# Patient Record
Sex: Female | Born: 1975 | Race: White | Hispanic: No | Marital: Married | Smoking: Never smoker
Health system: Southern US, Community
[De-identification: ages and names within clinical notes are randomized; demographics above are authoritative.]

## PROBLEM LIST (undated history)

## (undated) DIAGNOSIS — G43909 Migraine, unspecified, not intractable, without status migrainosus: Secondary | ICD-10-CM

## (undated) DIAGNOSIS — F419 Anxiety disorder, unspecified: Secondary | ICD-10-CM

## (undated) DIAGNOSIS — R7989 Other specified abnormal findings of blood chemistry: Secondary | ICD-10-CM

## (undated) DIAGNOSIS — M797 Fibromyalgia: Secondary | ICD-10-CM

## (undated) DIAGNOSIS — Z77120 Contact with and (suspected) exposure to mold (toxic): Secondary | ICD-10-CM

## (undated) DIAGNOSIS — Z923 Personal history of irradiation: Secondary | ICD-10-CM

## (undated) DIAGNOSIS — C50919 Malignant neoplasm of unspecified site of unspecified female breast: Secondary | ICD-10-CM

## (undated) HISTORY — DX: Anxiety disorder, unspecified: F41.9

## (undated) HISTORY — PX: CERVICAL SPINE SURGERY: SHX589

## (undated) HISTORY — DX: Other specified abnormal findings of blood chemistry: R79.89

## (undated) HISTORY — DX: Contact with and (suspected) exposure to mold (toxic): Z77.120

## (undated) HISTORY — DX: Fibromyalgia: M79.7

## (undated) HISTORY — DX: Migraine, unspecified, not intractable, without status migrainosus: G43.909

---
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
## 2016-05-12 DIAGNOSIS — R51 Headache: Secondary | ICD-10-CM | POA: Diagnosis not present

## 2016-05-12 DIAGNOSIS — M62830 Muscle spasm of back: Secondary | ICD-10-CM | POA: Diagnosis not present

## 2016-05-12 DIAGNOSIS — M791 Myalgia: Secondary | ICD-10-CM | POA: Diagnosis not present

## 2016-05-12 DIAGNOSIS — M9902 Segmental and somatic dysfunction of thoracic region: Secondary | ICD-10-CM | POA: Diagnosis not present

## 2016-05-12 DIAGNOSIS — M9901 Segmental and somatic dysfunction of cervical region: Secondary | ICD-10-CM | POA: Diagnosis not present

## 2016-05-12 DIAGNOSIS — M5032 Other cervical disc degeneration, mid-cervical region, unspecified level: Secondary | ICD-10-CM | POA: Diagnosis not present

## 2016-05-13 DIAGNOSIS — M9901 Segmental and somatic dysfunction of cervical region: Secondary | ICD-10-CM | POA: Diagnosis not present

## 2016-05-13 DIAGNOSIS — M9902 Segmental and somatic dysfunction of thoracic region: Secondary | ICD-10-CM | POA: Diagnosis not present

## 2016-05-13 DIAGNOSIS — M791 Myalgia: Secondary | ICD-10-CM | POA: Diagnosis not present

## 2016-05-13 DIAGNOSIS — M62830 Muscle spasm of back: Secondary | ICD-10-CM | POA: Diagnosis not present

## 2016-05-13 DIAGNOSIS — R51 Headache: Secondary | ICD-10-CM | POA: Diagnosis not present

## 2016-05-13 DIAGNOSIS — M5032 Other cervical disc degeneration, mid-cervical region, unspecified level: Secondary | ICD-10-CM | POA: Diagnosis not present

## 2016-05-20 DIAGNOSIS — M9901 Segmental and somatic dysfunction of cervical region: Secondary | ICD-10-CM | POA: Diagnosis not present

## 2016-05-20 DIAGNOSIS — R51 Headache: Secondary | ICD-10-CM | POA: Diagnosis not present

## 2016-05-20 DIAGNOSIS — M62830 Muscle spasm of back: Secondary | ICD-10-CM | POA: Diagnosis not present

## 2016-05-20 DIAGNOSIS — M9902 Segmental and somatic dysfunction of thoracic region: Secondary | ICD-10-CM | POA: Diagnosis not present

## 2016-05-20 DIAGNOSIS — M5032 Other cervical disc degeneration, mid-cervical region, unspecified level: Secondary | ICD-10-CM | POA: Diagnosis not present

## 2016-05-20 DIAGNOSIS — M791 Myalgia: Secondary | ICD-10-CM | POA: Diagnosis not present

## 2016-05-26 ENCOUNTER — Encounter: Payer: Self-pay | Admitting: Neurology

## 2016-05-26 ENCOUNTER — Ambulatory Visit (INDEPENDENT_AMBULATORY_CARE_PROVIDER_SITE_OTHER): Payer: 59 | Admitting: Neurology

## 2016-05-26 DIAGNOSIS — G243 Spasmodic torticollis: Secondary | ICD-10-CM | POA: Diagnosis not present

## 2016-05-26 DIAGNOSIS — G43709 Chronic migraine without aura, not intractable, without status migrainosus: Secondary | ICD-10-CM

## 2016-05-26 NOTE — Patient Instructions (Addendum)
Remember to drink plenty of fluid, eat healthy meals and do not skip any meals. Try to eat protein with a every meal and eat a healthy snack such as fruit or nuts in between meals. Try to keep a regular sleep-wake schedule and try to exercise daily, particularly in the form of walking, 20-30 minutes a day, if you can.   I would like to see you back for botox injections, sooner if we need to. Please call us with any interim questions, concerns, problems, updates or refill requests.   Our phone number is [phone]. We also have an after hours call service for urgent matters and there is a physician on-call for urgent questions. For any emergencies you know to call 911 or go to the nearest emergency room

## 2016-05-26 NOTE — Progress Notes (Signed)
[facility]    Provider:  Dr Jaynee Eagles Referring Provider: No ref. provider found Primary Care Physician:  Aretta Nip, MD  CC:  migraines  HPI:  Holly Jenkins is a 40 y.o. female here as a referral for migraines. PMHx migraines, cervical dystonia, fibromyalgia. Started 20 years ago, Continuous birth control helped a lot. Trigger is alcohol. Neck pain is a trigger. She has confusion, light sensitivity, nausea, no vomiting, bilateral, tightness, pressure. When severe she needs a dark room and to lay still and cool. No aura. Wakes up with them and more at night as well. When she has one, slowly progresses to 6/10 on average. They usually last 24 hours. The cambia and rizatriptan. She has daily headaches. 8 days a month are migrainous. She was due on botox the 24tj of July. She has neck pain and a diagnosis of cervical dystonia. She has tried muscle relaxers, chiropractic, massage, physical therapy. Very tight progressive muscle pain. Additional botox in the shoulders has significantly helped with the pain and limited range of motion. Previous to botox in the cervical muscles she had a 5/10 in pain all the time significantly improved. Tizanidine does not help the cervical dystonia that it does help with the migraines. Patient is also on clonazepam which does not help with cervical dystonia. She has also tried Robaxin  Medications tried: Topiramate, nortriptyline, propranolol, lyrica, "a million preventatives", tizanidine, rizatriptan and other triptans she cannot remember, cambia, Zanaflex, Maxalt, trigger point injections in her cervical muscles, massage, Robaxin.  Reviewed notes, labs and imaging from outside physicians, which showed: Patient is being seen at [facility] in [place]. Patient has been receiving Botox injections. Per notes, Botox decrease his frequency and severity significantly, patient headaches and migraines increase when she  misses her Botox injections. The headaches are mostly frontotemporal and size of the head. Some right base of her skull and she also has some discomfort in her posterior neck and down her shoulders and she has had spine surgery as well. Multifactorial situation. It appears they injected 250 units of Botox. 155 for Botox migraine protocol and additional 100 for cervical dystonia. They injected the areas where she had most of her pain as well as in her shoulders and neck. She sees a pain specialist as well for her neck pain. She was diagnosed with chronic daily migraine. It appears they also used additional Botox in the front half of her head and also in the nuchal ridge and down her neck. She has had multiple side effects from medications. She is on Zanaflex currently. Patient also is having some radiculopathy into her right arm, the Botox helped in the cervical muscles. She has noticed wearing off effect the 2 weeks prior to her Botox injections.  Reviewed MRI of the brain report. MRI of the brain 05/10/2013. Unremarkable MRI of the brain.   Review of Systems: Patient complains of symptoms per HPI as well as the following symptoms: Fatigue, headaches, dizziness, joint pain, anxiety, decreased energy, allergies . Pertinent negatives per HPI. All others negative.   Social History   Social History  . Marital status: Married    Spouse name: N/A  . Number of children: N/A  . Years of education: N/A   Occupational History  . Not on file.   Social History Main Topics  . Smoking status: Never Smoker  . Smokeless tobacco: Never Used  . Alcohol use No  . Drug use: No  . Sexual activity: Not on file  Other Topics Concern  . Not on file   Social History Narrative  . No narrative on file    Family History  Problem Relation Age of Onset  . Healthy Mother   . Diabetes Maternal Grandmother     Past Medical History:  Diagnosis Date  . Anxiety   . Fibromyalgia   . Migraines     Past  Surgical History:  Procedure Laterality Date  . CERVICAL SPINE SURGERY     c5-c6-c7 fusion with disc transplants    Current Outpatient Prescriptions  Medication Sig Dispense Refill  . Cholecalciferol (VITAMIN D3) 2000 units TABS Take 1 tablet by mouth.    . clonazePAM (KLONOPIN) 0.5 MG tablet Take 0.5 mg by mouth 2 (two) times daily as needed for anxiety.    . Diclofenac Potassium (CAMBIA) 50 MG PACK Take by mouth.    . escitalopram (LEXAPRO) 10 MG tablet Take 10 mg by mouth daily.    Marland Kitchen ethynodiol-ethinyl estradiol (KELNOR,ZOVIA) 1-35 MG-MCG tablet Take 1 tablet by mouth daily.    . Misc Natural Products (OSTEO BI-FLEX JOINT SHIELD PO) Take by mouth.    . pregabalin (LYRICA) 150 MG capsule Take 150 mg by mouth daily.    . Probiotic Product (PROBIOTIC PO) Take by mouth.    . rizatriptan (MAXALT-MLT) 10 MG disintegrating tablet Take 10 mg by mouth as needed for migraine. May repeat in 2 hours if needed    . tiZANidine (ZANAFLEX) 4 MG tablet Take 4 mg by mouth at bedtime. Take 1 to 1.5 tablets daily     No current facility-administered medications for this visit.     Allergies as of 05/26/2016 - Review Complete 05/26/2016  Allergen Reaction Noted  . Dust mite extract  05/26/2016    Vitals: BP 120/82   Pulse 80   Resp 20   Ht 5\' 8"  (1.727 m)   Wt 154 lb (69.9 kg)   BMI 23.42 kg/m  Last Weight:  Wt Readings from Last 1 Encounters:  05/26/16 154 lb (69.9 kg)   Last Height:   Ht Readings from Last 1 Encounters:  05/26/16 5\' 8"  (1.727 m)    Physical exam: Exam: Gen: NAD, conversant, well nourised, well groomed                     CV: RRR, no MRG. No Carotid Bruits. No peripheral edema, warm, nontender Eyes: Conjunctivae clear without exudates or hemorrhage MSK: Hypertrophied bilateral trapezius muscles, elevated shoulders bilaterally, decreased range of motion of the head, tenderness to palpation.  Neuro: Detailed Neurologic Exam  Speech:    Speech is normal; fluent and  spontaneous with normal comprehension.  Cognition:    The patient is oriented to person, place, and time;     recent and remote memory intact;     language fluent;     normal attention, concentration,     fund of knowledge Cranial Nerves:    The pupils are equal, round, and reactive to light. The fundi are normal and spontaneous venous pulsations are present. Visual fields are full to finger confrontation. Extraocular movements are intact. Trigeminal sensation is intact and the muscles of mastication are normal. The face is symmetric. The palate elevates in the midline. Hearing intact. Voice is normal. Shoulder shrug is normal. The tongue has normal motion without fasciculations.   Coordination:    Normal finger to nose and heel to shin. Normal rapid alternating movements.   Gait:    Heel-toe and tandem gait  are normal.   Motor Observation:    No asymmetry, no atrophy, and no involuntary movements noted. Tone:    Normal muscle tone.    Posture:    Posture is normal. normal erect    Strength:    Strength is V/V in the upper and lower limbs.      Sensation: intact to LT     Reflex Exam:  DTR's:    Deep tendon reflexes in the upper and lower extremities are normal bilaterally.   Toes:    The toes are downgoing bilaterally.   Clonus:    Clonus is absent.      Assessment/Plan:  40 year old female with chronic migraines without aura and without status migrainosus not intractable. Also with cervical dystonia.   Continue Botox or migraine: Patient has tried multiple preventatives and acute management for her migraines. She has been getting Botox for migraine protocol in [place] and she recently moved here. She received great relief from the Botox for migraine injections. She is currently one-month overdue and headaches are worsening. We'll request continuation of her Botox for migraine.  Cervical dystonia: Patient has been receiving injections into her trapezius muscles  bilaterally. She does have hypertrophied trapezius muscles, decreased range of motion, elevated shoulders. She has tried muscle relaxers, physical therapy, pain injections and trigger point injections, massage without relief progressively worsening. Patient has received Botox injections into these muscles with relief. We'll request that we continue that 100 additional units.  Cc: Dr Radene Ou  Sarina Ill, MD  [facility] [address] [place], [place] [address]  Phone [phone] Fax [phone]

## 2016-05-27 DIAGNOSIS — M5032 Other cervical disc degeneration, mid-cervical region, unspecified level: Secondary | ICD-10-CM | POA: Diagnosis not present

## 2016-05-27 DIAGNOSIS — R51 Headache: Secondary | ICD-10-CM | POA: Diagnosis not present

## 2016-05-27 DIAGNOSIS — M62830 Muscle spasm of back: Secondary | ICD-10-CM | POA: Diagnosis not present

## 2016-05-27 DIAGNOSIS — M9902 Segmental and somatic dysfunction of thoracic region: Secondary | ICD-10-CM | POA: Diagnosis not present

## 2016-05-27 DIAGNOSIS — M791 Myalgia: Secondary | ICD-10-CM | POA: Diagnosis not present

## 2016-05-27 DIAGNOSIS — M9901 Segmental and somatic dysfunction of cervical region: Secondary | ICD-10-CM | POA: Diagnosis not present

## 2016-06-01 DIAGNOSIS — R51 Headache: Secondary | ICD-10-CM | POA: Diagnosis not present

## 2016-06-01 DIAGNOSIS — M5032 Other cervical disc degeneration, mid-cervical region, unspecified level: Secondary | ICD-10-CM | POA: Diagnosis not present

## 2016-06-01 DIAGNOSIS — M791 Myalgia: Secondary | ICD-10-CM | POA: Diagnosis not present

## 2016-06-01 DIAGNOSIS — M9902 Segmental and somatic dysfunction of thoracic region: Secondary | ICD-10-CM | POA: Diagnosis not present

## 2016-06-01 DIAGNOSIS — M62830 Muscle spasm of back: Secondary | ICD-10-CM | POA: Diagnosis not present

## 2016-06-01 DIAGNOSIS — M9901 Segmental and somatic dysfunction of cervical region: Secondary | ICD-10-CM | POA: Diagnosis not present

## 2016-06-03 ENCOUNTER — Telehealth: Payer: Self-pay | Admitting: Neurology

## 2016-06-03 NOTE — Telephone Encounter (Signed)
Called patient to cancel apt due to PA not being completed. Left a VM asking her to return my call.

## 2016-06-03 NOTE — Telephone Encounter (Signed)
Patient  is calling. She received a call from [facility] regarding Botox medication. She says forms has to be filled out before medicine can be sent and the patient has an appointment tomorrow. Please call the patient and advise.

## 2016-06-04 ENCOUNTER — Ambulatory Visit: Payer: 59 | Admitting: Neurology

## 2016-06-04 MED FILL — BOTOX 200 UNITS VIAL: 200 | 30 days supply | Qty: 1 | Fill #0

## 2016-06-04 NOTE — Telephone Encounter (Signed)
Called and got approval for Botox approval # [id number] 06-04-2016- 09-04-2016. Patient is ready to be scheduled I called and left her a message to call back. Patient Botox is also ready to be picked up for her Botox apt. Patient has to pick up Botox from [facility] . [phone].

## 2016-06-04 NOTE — Telephone Encounter (Signed)
Pt returned call. Danielle took call to schedule.

## 2016-06-08 DIAGNOSIS — M62830 Muscle spasm of back: Secondary | ICD-10-CM | POA: Diagnosis not present

## 2016-06-08 DIAGNOSIS — M9901 Segmental and somatic dysfunction of cervical region: Secondary | ICD-10-CM | POA: Diagnosis not present

## 2016-06-08 DIAGNOSIS — M5032 Other cervical disc degeneration, mid-cervical region, unspecified level: Secondary | ICD-10-CM | POA: Diagnosis not present

## 2016-06-08 DIAGNOSIS — M791 Myalgia: Secondary | ICD-10-CM | POA: Diagnosis not present

## 2016-06-08 DIAGNOSIS — M9902 Segmental and somatic dysfunction of thoracic region: Secondary | ICD-10-CM | POA: Diagnosis not present

## 2016-06-08 DIAGNOSIS — R51 Headache: Secondary | ICD-10-CM | POA: Diagnosis not present

## 2016-06-10 DIAGNOSIS — M797 Fibromyalgia: Secondary | ICD-10-CM | POA: Diagnosis not present

## 2016-06-10 DIAGNOSIS — F411 Generalized anxiety disorder: Secondary | ICD-10-CM | POA: Diagnosis not present

## 2016-06-10 DIAGNOSIS — G43709 Chronic migraine without aura, not intractable, without status migrainosus: Secondary | ICD-10-CM | POA: Diagnosis not present

## 2016-06-17 DIAGNOSIS — M9901 Segmental and somatic dysfunction of cervical region: Secondary | ICD-10-CM | POA: Diagnosis not present

## 2016-06-17 DIAGNOSIS — M5032 Other cervical disc degeneration, mid-cervical region, unspecified level: Secondary | ICD-10-CM | POA: Diagnosis not present

## 2016-06-17 DIAGNOSIS — M62830 Muscle spasm of back: Secondary | ICD-10-CM | POA: Diagnosis not present

## 2016-06-17 DIAGNOSIS — M791 Myalgia: Secondary | ICD-10-CM | POA: Diagnosis not present

## 2016-06-17 DIAGNOSIS — M9902 Segmental and somatic dysfunction of thoracic region: Secondary | ICD-10-CM | POA: Diagnosis not present

## 2016-06-17 DIAGNOSIS — R51 Headache: Secondary | ICD-10-CM | POA: Diagnosis not present

## 2016-06-18 ENCOUNTER — Ambulatory Visit: Payer: 59 | Admitting: Neurology

## 2016-06-18 ENCOUNTER — Ambulatory Visit (INDEPENDENT_AMBULATORY_CARE_PROVIDER_SITE_OTHER): Payer: 59 | Admitting: Neurology

## 2016-06-18 VITALS — BP 125/79 | HR 96

## 2016-06-18 DIAGNOSIS — G43709 Chronic migraine without aura, not intractable, without status migrainosus: Secondary | ICD-10-CM | POA: Diagnosis not present

## 2016-06-18 NOTE — Progress Notes (Signed)
Consent Form Botulism Toxin Injection For Chronic Migraine  Botulism toxin has been approved by the Federal drug administration for treatment of chronic migraine. Botulism toxin does not cure chronic migraine and it may not be effective in some patients.  The administration of botulism toxin is accomplished by injecting a small amount of toxin into the muscles of the neck and head. Dosage must be titrated for each individual. Any benefits resulting from botulism toxin tend to wear off after 3 months with a repeat injection required if benefit is to be maintained. Injections are usually done every 3-4 months with maximum effect peak achieved by about 2 or 3 weeks. Botulism toxin is expensive and you should be sure of what costs you will incur resulting from the injection.  The side effects of botulism toxin use for chronic migraine may include:   -Transient, and usually mild, facial weakness with facial injections  -Transient, and usually mild, head or neck weakness with head/neck injections  -Reduction or loss of forehead facial animation due to forehead muscle              weakness  -Eyelid drooping  -Dry eye  -Pain at the site of injection or bruising at the site of injection  -Double vision  -Potential unknown long term risks  Contraindications: You should not have Botox if you are pregnant, nursing, allergic to albumin, have an infection, skin condition, or muscle weakness at the site of the injection, or have myasthenia gravis, Lambert-Eaton syndrome, or ALS.  It is also possible that as with any injection, there may be an allergic reaction or no effect from the medication. Reduced effectiveness after repeated injections is sometimes seen and rarely infection at the injection site may occur. All care will be taken to prevent these side effects. If therapy is given over a long time, atrophy and wasting in the muscle injected may occur. Occasionally the patient's become refractory to  treatment because they develop antibodies to the toxin. In this event, therapy needs to be modified.  I have read the above information and consent to the administration of botulism toxin.    ______________  _____   _________________  Patient signature     Date   Witness signature       BOTOX PROCEDURE NOTE FOR MIGRAINE HEADACHE    Contraindications and precautions discussed with patient(above). Aseptic procedure was observed and patient tolerated procedure. Procedure performed by Dr. Georgia Dom  The condition has existed for more than 6 months, and pt does not have a diagnosis of ALS, Myasthenia Gravis or Lambert-Eaton Syndrome. Risks and benefits of injections discussed and pt agrees to proceed with the procedure. Written consent obtained  These injections are medically necessary. He receives good benefits from these injections. These injections do not cause sedations or hallucinations which the oral therapies may cause.  Indication/Diagnosis: chronic migraine BOTOX(J0585) injection was performed according to protocol by [employer]. 200 units of BOTOX was dissolved into 4 cc NS.   Botox-200 unitsx1 vial Lot: [id number] Expiration: 12/2018 [id number] NDC: [id number]  0.9% Sodium Chloride- 33mL total [id number] Expiration: 02/2018 NDC: [id number]   Description of procedure:  The patient was placed in a sitting position. The standard protocol was used for Botox as follows, with 5 units of Botox injected at each site:   -Procerus muscle, midline injection  -Corrugator muscle, bilateral injection  -Frontalis muscle, bilateral injection, with 2 sites each side, medial injection was performed in the upper one third of the frontalis muscle,  in the region vertical from the medial inferior edge of the superior orbital rim. The lateral injection was again in the upper one third of the forehead vertically above the lateral limbus of the cornea, 1.5 cm lateral to the medial  injection site.  -Temporalis muscle injection, 4 sites, bilaterally. The first injection was 3 cm above the tragus of the ear, second injection site was 1.5 cm to 3 cm up from the first injection site in line with the tragus of the ear. The third injection site was 1.5-3 cm forward between the first 2 injection sites. The fourth injection site was 1.5 cm posterior to the second injection site.  -Occipitalis muscle injection, 3 sites, bilaterally. The first injection was done one half way between the occipital protuberance and the tip of the mastoid process behind the ear. The second injection site was done lateral and superior to the first, 1 fingerbreadth from the first injection. The third injection site was 1 fingerbreadth superiorly and medially from the first injection site.  -Cervical paraspinal muscle injection, 2 sites, bilateral knee first injection site was 1 cm from the midline of the cervical spine, 3 cm inferior to the lower border of the occipital protuberance. The second injection site was 1.5 cm superiorly and laterally to the first injection site. Addition 20 units used here.  -Trapezius muscle injection was performed at 3 sites, bilaterally. The first injection site was in the upper trapezius muscle halfway between the inflection point of the neck, and the acromion. The second injection site was one half way between the acromion and the first injection site. The third injection was done between the first injection site and the inflection point of the neck. Addition 20 units used here.     Will return for repeat injection in 3 months.   A 200 unit sof Botox was used, 195 units were injected, the rest of the Botox was wasted. The patient tolerated the procedure well, there were no complications of the above procedure.

## 2016-06-18 NOTE — Progress Notes (Signed)
Botox-200 unitsx1 vial Lot: [id number] Expiration: 12/2018 [id number] NDC: [id number]  0.9% Sodium Chloride- 37mL total [id number] Expiration: 02/2018 NDC: [id number]

## 2016-06-22 DIAGNOSIS — M62830 Muscle spasm of back: Secondary | ICD-10-CM | POA: Diagnosis not present

## 2016-06-22 DIAGNOSIS — R51 Headache: Secondary | ICD-10-CM | POA: Diagnosis not present

## 2016-06-22 DIAGNOSIS — M9902 Segmental and somatic dysfunction of thoracic region: Secondary | ICD-10-CM | POA: Diagnosis not present

## 2016-06-22 DIAGNOSIS — M791 Myalgia: Secondary | ICD-10-CM | POA: Diagnosis not present

## 2016-06-22 DIAGNOSIS — M9901 Segmental and somatic dysfunction of cervical region: Secondary | ICD-10-CM | POA: Diagnosis not present

## 2016-06-22 DIAGNOSIS — M5032 Other cervical disc degeneration, mid-cervical region, unspecified level: Secondary | ICD-10-CM | POA: Diagnosis not present

## 2016-06-29 DIAGNOSIS — R51 Headache: Secondary | ICD-10-CM | POA: Diagnosis not present

## 2016-06-29 DIAGNOSIS — M9902 Segmental and somatic dysfunction of thoracic region: Secondary | ICD-10-CM | POA: Diagnosis not present

## 2016-06-29 DIAGNOSIS — M9901 Segmental and somatic dysfunction of cervical region: Secondary | ICD-10-CM | POA: Diagnosis not present

## 2016-06-29 DIAGNOSIS — M791 Myalgia: Secondary | ICD-10-CM | POA: Diagnosis not present

## 2016-06-29 DIAGNOSIS — M62830 Muscle spasm of back: Secondary | ICD-10-CM | POA: Diagnosis not present

## 2016-06-29 DIAGNOSIS — M5032 Other cervical disc degeneration, mid-cervical region, unspecified level: Secondary | ICD-10-CM | POA: Diagnosis not present

## 2016-06-30 ENCOUNTER — Telehealth: Payer: Self-pay | Admitting: Neurology

## 2016-06-30 DIAGNOSIS — G43709 Chronic migraine without aura, not intractable, without status migrainosus: Secondary | ICD-10-CM | POA: Diagnosis not present

## 2016-06-30 NOTE — Telephone Encounter (Signed)
Dr Jaynee Eagles- FYI  Called and spoke to patient. She has had a migraine for the past 4 days. She has tried taking advil, triptan, cambia and caffeine but nothing has helped. Advised someone will have to drive her to appt at 2pm. She will receive depacon, toradol, compaxine and steroids. She verbalized understanding and stated she will be here.

## 2016-06-30 NOTE — Telephone Encounter (Signed)
Patient is calling. She has had a migraine for over 4 days. Can she come in for an infusion? Please call and advise.

## 2016-07-06 DIAGNOSIS — M62830 Muscle spasm of back: Secondary | ICD-10-CM | POA: Diagnosis not present

## 2016-07-06 DIAGNOSIS — M5032 Other cervical disc degeneration, mid-cervical region, unspecified level: Secondary | ICD-10-CM | POA: Diagnosis not present

## 2016-07-06 DIAGNOSIS — M9902 Segmental and somatic dysfunction of thoracic region: Secondary | ICD-10-CM | POA: Diagnosis not present

## 2016-07-06 DIAGNOSIS — M9901 Segmental and somatic dysfunction of cervical region: Secondary | ICD-10-CM | POA: Diagnosis not present

## 2016-07-06 DIAGNOSIS — M791 Myalgia: Secondary | ICD-10-CM | POA: Diagnosis not present

## 2016-07-06 DIAGNOSIS — R51 Headache: Secondary | ICD-10-CM | POA: Diagnosis not present

## 2016-07-09 MED FILL — LYRICA 50 MG CAPSULE: 50 | 90 days supply | Qty: 90 | Fill #0

## 2016-07-13 DIAGNOSIS — R51 Headache: Secondary | ICD-10-CM | POA: Diagnosis not present

## 2016-07-13 DIAGNOSIS — M9902 Segmental and somatic dysfunction of thoracic region: Secondary | ICD-10-CM | POA: Diagnosis not present

## 2016-07-13 DIAGNOSIS — M791 Myalgia: Secondary | ICD-10-CM | POA: Diagnosis not present

## 2016-07-13 DIAGNOSIS — M5032 Other cervical disc degeneration, mid-cervical region, unspecified level: Secondary | ICD-10-CM | POA: Diagnosis not present

## 2016-07-13 DIAGNOSIS — M9901 Segmental and somatic dysfunction of cervical region: Secondary | ICD-10-CM | POA: Diagnosis not present

## 2016-07-13 DIAGNOSIS — M62830 Muscle spasm of back: Secondary | ICD-10-CM | POA: Diagnosis not present

## 2016-07-27 DIAGNOSIS — M9901 Segmental and somatic dysfunction of cervical region: Secondary | ICD-10-CM | POA: Diagnosis not present

## 2016-07-27 DIAGNOSIS — M62830 Muscle spasm of back: Secondary | ICD-10-CM | POA: Diagnosis not present

## 2016-07-27 DIAGNOSIS — R51 Headache: Secondary | ICD-10-CM | POA: Diagnosis not present

## 2016-07-27 DIAGNOSIS — M5032 Other cervical disc degeneration, mid-cervical region, unspecified level: Secondary | ICD-10-CM | POA: Diagnosis not present

## 2016-07-27 DIAGNOSIS — M791 Myalgia: Secondary | ICD-10-CM | POA: Diagnosis not present

## 2016-07-27 DIAGNOSIS — M9902 Segmental and somatic dysfunction of thoracic region: Secondary | ICD-10-CM | POA: Diagnosis not present

## 2016-08-06 MED FILL — LYRICA 150 MG CAPSULE: 150 | 90 days supply | Qty: 90 | Fill #0

## 2016-08-12 ENCOUNTER — Telehealth: Payer: Self-pay | Admitting: Neurology

## 2016-08-12 NOTE — Telephone Encounter (Signed)
Called and spoke to patient. Made f/u for tomorrow with MM, NP at [facility] her to check in by 845am, Advised per AA, MD that she wants to discuss her re-trying a preventative medication. She is aware she has failed multiple, but would like her to try one again since she has had an increase in her migraines. Pt agreeable to this.

## 2016-08-12 NOTE — Telephone Encounter (Signed)
Patient is calling. She is having a lot of migraines a week at a time and it is too early to fill her medications Diclofenac Potassium (CAMBIA) 50 MG PACK and rizatriptan (MAXALT-MLT) 10 MG disintegrating tablet . Please call and discuss because she is not sure what to do.

## 2016-08-12 NOTE — Telephone Encounter (Signed)
Spoke to [place], MD. Pt needs to come in for f/u appt to discuss trying preventative medication again since migraines has increased.

## 2016-08-13 ENCOUNTER — Encounter: Payer: Self-pay | Admitting: Adult Health

## 2016-08-13 ENCOUNTER — Ambulatory Visit (INDEPENDENT_AMBULATORY_CARE_PROVIDER_SITE_OTHER): Payer: Commercial Managed Care - HMO | Admitting: Adult Health

## 2016-08-13 ENCOUNTER — Telehealth: Payer: Self-pay | Admitting: Neurology

## 2016-08-13 VITALS — BP 113/78 | HR 78 | Ht 68.0 in | Wt 159.2 lb

## 2016-08-13 DIAGNOSIS — G43011 Migraine without aura, intractable, with status migrainosus: Secondary | ICD-10-CM

## 2016-08-13 MED ORDER — PREDNISONE 5 MG PO TABS: ORAL_TABLET | ORAL | 0 refills | Status: DC

## 2016-08-13 MED ORDER — TOPIRAMATE 25 MG PO TABS: ORAL_TABLET | ORAL | 11 refills | Status: DC

## 2016-08-13 NOTE — Telephone Encounter (Signed)
It only interacts over 200mg  a day. I spoke to pharmacist.

## 2016-08-13 NOTE — Progress Notes (Addendum)
PATIENT: Holly Jenkins DOB: May 07, 1976  REASON FOR VISIT: follow up- migraine HISTORY FROM: patient  HISTORY OF PRESENT ILLNESS: Today 08/13/16: Holly Jenkins is a 40 year old female with a history of migraine headaches. She returns today for follow-up. She is currently receiving Botox injections. She states when she started Botox in [place] it worked very well for her. She states that unfortunately she lost insurance and was unable to complete a cycle of her Botox. Since then it seems that her headaches have gotten worse. She has 15 headaches days a month. Her headaches are always located across the forehead. She denies phonophobia but confirms photophobia. She also has nausea but no vomiting. Denies any visual disturbances. Denies any numbness or tingling in the upper or lower extremities. She does state that she has tried and failed multiple medications in the past. She states some of the medications also affected her blood pressure causing her to black out. She states that she has moved into an old house and since then her allergies have been worse. She also finds paint primer  is a trigger for her migraines. She states that she's had a migraine since Saturday and has used up all of her acute medications such as Maxalt and Cambia. She returns today for an evaluation.  HISTORY 05/26/16:Holly Jenkins is a 40 y.o. female here as a referral for migraines. PMHx migraines, cervical dystonia, fibromyalgia. Started 20 years ago, Continuous birth control helped a lot. Trigger is alcohol. Neck pain is a trigger. She has confusion, light sensitivity, nausea, no vomiting, bilateral, tightness, pressure. When severe she needs a dark room and to lay still and cool. No aura. Wakes up with them and more at night as well. When she has one, slowly progresses to 6/10 on average. They usually last 24 hours. The cambia and rizatriptan. She has daily headaches. 8 days a month are migrainous. She was due on  botox the 24tj of July. She has neck pain and a diagnosis of cervical dystonia. She has tried muscle relaxers, chiropractic, massage, physical therapy. Very tight progressive muscle pain. Additional botox in the shoulders has significantly helped with the pain and limited range of motion. Previous to botox in the cervical muscles she had a 5/10 in pain all the time significantly improved. Tizanidine does not help the cervical dystonia that it does help with the migraines. Patient is also on clonazepam which does not help with cervical dystonia. She has also tried Robaxin  Medications tried: Topiramate, nortriptyline, propranolol, lyrica, "a million preventatives", tizanidine, rizatriptan and other triptans she cannot remember, cambia, Zanaflex, Maxalt, trigger point injections in her cervical muscles, massage, Robaxin.  Reviewed notes, labs and imaging from outside physicians, which showed: Patient is being seen at [facility] in [place]. Patient has been receiving Botox injections. Per notes, Botox decrease his frequency and severity significantly, patient headaches and migraines increase when she misses her Botox injections. The headaches are mostly frontotemporal and size of the head. Some right base of her skull and she also has some discomfort in her posterior neck and down her shoulders and she has had spine surgery as well. Multifactorial situation. It appears they injected 250 units of Botox. 155 for Botox migraine protocol and additional 100 for cervical dystonia. They injected the areas where she had most of her pain as well as in her shoulders and neck. She sees a pain specialist as well for her neck pain. She was diagnosed with chronic daily migraine. It appears they  also used additional Botox in the front half of her head and also in the nuchal ridge and down her neck. She has had multiple side effects from medications. She is on Zanaflex currently. Patient also is  having some radiculopathy into her right arm, the Botox helped in the cervical muscles. She has noticed wearing off effect the 2 weeks prior to her Botox injections.  Reviewed MRI of the brain report. MRI of the brain 05/10/2013. Unremarkable MRI of the brain.   REVIEW OF SYSTEMS: Out of a complete 14 system review of symptoms, the patient complains only of the following symptoms, and all other reviewed systems are negative.  Eye itching, eye redness, facial swelling, runny nose, nausea, insomnia, frequent waking, joint pain, back pain, neck pain, neck stiffness, confusion, depression, nervous/anxious, dizziness, headache, environmental allergies, food allergies  ALLERGIES: Allergies  Allergen Reactions  . Dust Mite Extract     HOME MEDICATIONS: Outpatient Medications Prior to Visit  Medication Sig Dispense Refill  . clonazePAM (KLONOPIN) 0.5 MG tablet Take 0.5 mg by mouth 2 (two) times daily as needed for anxiety.    . Diclofenac Potassium (CAMBIA) 50 MG PACK Take by mouth.    . escitalopram (LEXAPRO) 10 MG tablet Take 10 mg by mouth daily.    Marland Kitchen ethynodiol-ethinyl estradiol (KELNOR,ZOVIA) 1-35 MG-MCG tablet Take 1 tablet by mouth daily.    . pregabalin (LYRICA) 150 MG capsule Take 150 mg by mouth daily.    . Probiotic Product (PROBIOTIC PO) Take by mouth.    . rizatriptan (MAXALT-MLT) 10 MG disintegrating tablet Take 10 mg by mouth as needed for migraine. May repeat in 2 hours if needed    . tiZANidine (ZANAFLEX) 4 MG tablet Take 4 mg by mouth at bedtime. Take 1 to 1.5 tablets daily    . Cholecalciferol (VITAMIN D3) 2000 units TABS Take 1 tablet by mouth.    . Misc Natural Products (OSTEO BI-FLEX JOINT SHIELD PO) Take by mouth.     No facility-administered medications prior to visit.     PAST MEDICAL HISTORY: Past Medical History:  Diagnosis Date  . Anxiety   . Fibromyalgia   . Migraines     PAST SURGICAL HISTORY: Past Surgical History:  Procedure Laterality Date  .  CERVICAL SPINE SURGERY     c5-c6-c7 fusion with disc transplants    FAMILY HISTORY: Family History  Problem Relation Age of Onset  . Healthy Mother   . Diabetes Maternal Grandmother     SOCIAL HISTORY: Social History   Social History  . Marital status: Married    Spouse name: N/A  . Number of children: N/A  . Years of education: N/A   Occupational History  . Not on file.   Social History Main Topics  . Smoking status: Never Smoker  . Smokeless tobacco: Never Used  . Alcohol use No  . Drug use: No  . Sexual activity: Not on file   Other Topics Concern  . Not on file   Social History Narrative  . No narrative on file      PHYSICAL EXAM  Vitals:   08/13/16 0850  BP: 113/78  Pulse: 78  Weight: 159 lb 3.2 oz (72.2 kg)  Height: 5\' 8"  (1.727 m)   Body mass index is 24.21 kg/m.  Generalized: Well developed, in no acute distress   Neurological examination  Mentation: Alert oriented to time, place, history taking. Follows all commands speech and language fluent Cranial nerve II-XII: Pupils were equal round reactive  to light. Extraocular movements were full, visual field were full on confrontational test. Facial sensation and strength were normal. Uvula tongue midline. Head turning and shoulder shrug  were normal and symmetric. Motor: The motor testing reveals 5 over 5 strength of all 4 extremities. Good symmetric motor tone is noted throughout.  Sensory: Sensory testing is intact to soft touch on all 4 extremities. No evidence of extinction is noted.  Coordination: Cerebellar testing reveals good finger-nose-finger and heel-to-shin bilaterally.  Gait and station: Gait is normal. Tandem gait is normal. Romberg is negative. No drift is seen.  Reflexes: Deep tendon reflexes are symmetric and normal bilaterally.   DIAGNOSTIC DATA (LABS, IMAGING, TESTING) - I reviewed patient records, labs, notes, testing and imaging myself where available.     ASSESSMENT AND  PLAN 40 y.o. year old female  has a past medical history of Anxiety; Fibromyalgia; and Migraines. here with:  1. Migraine headaches  The patient's physical exam is relatively unremarkable. We will retry Topamax. She will begin by taking 25 mg at bedtime for 1 week then increase to 50 mg for one week and then 75 mg thereafter. I will give the patient a prednisone Dosepak to break her cycle of headaches. Patient will keep her appointment in December for Botox injections. Advised that if her headache severity and frequency does not improve she should let us know. She will keep her appointment in December with Dr. Jaynee Eagles.  The patient is on birth control. I advised that Topamax can interfere with her birth control. The patient should consider using a back up method that while on Topamax.   Ward Givens, MSN, NP-C 08/13/2016, 9:10 AM [facility] [address], [place], [place] [address] [phone]   Personally have participated in and made any corrections needed to history, physical, neuro exam,assessment and plan as stated above.    Sarina Ill, MD [facility]

## 2016-08-13 NOTE — Patient Instructions (Addendum)
Retry Topamax 25 mg at bedtime for 1 week then increase to 2 tablets at bedtime for 1 week then increase to 3 tablets at bedtime thereafter Continue Botox Prednisone dosepak If your symptoms worsen or you develop new symptoms please let us know. Topiramate tablets What is this medicine? TOPIRAMATE (toe PYRE a mate) is used to treat seizures in adults or children with epilepsy. It is also used for the prevention of migraine headaches. This medicine may be used for other purposes; ask your health care provider or pharmacist if you have questions. What should I tell my health care provider before I take this medicine? They need to know if you have any of these conditions: -bleeding disorders -cirrhosis of the liver or liver disease -diarrhea -glaucoma -kidney stones or kidney disease -low blood counts, like low white cell, platelet, or red cell counts -lung disease like asthma, obstructive pulmonary disease, emphysema -metabolic acidosis -on a ketogenic diet -schedule for surgery or a procedure -suicidal thoughts, plans, or attempt; a previous suicide attempt by you or a family member -an unusual or allergic reaction to topiramate, other medicines, foods, dyes, or preservatives -pregnant or trying to get pregnant -breast-feeding How should I use this medicine? Take this medicine by mouth with a glass of water. Follow the directions on the prescription label. Do not crush or chew. You may take this medicine with meals. Take your medicine at regular intervals. Do not take it more often than directed. Talk to your pediatrician regarding the use of this medicine in children. Special care may be needed. While this drug may be prescribed for children as young as 59 years of age for selected conditions, precautions do apply. Overdosage: If you think you have taken too much of this medicine contact a poison control center or emergency room at once. NOTE: This medicine is only for you. Do not share  this medicine with others. What if I miss a dose? If you miss a dose, take it as soon as you can. If your next dose is to be taken in less than 6 hours, then do not take the missed dose. Take the next dose at your regular time. Do not take double or extra doses. What may interact with this medicine? Do not take this medicine with any of the following medications: -probenecid This medicine may also interact with the following medications: -acetazolamide -alcohol -amitriptyline -aspirin and aspirin-like medicines -birth control pills -certain medicines for depression -certain medicines for seizures -certain medicines that treat or prevent blood clots like warfarin, enoxaparin, dalteparin, apixaban, dabigatran, and rivaroxaban -digoxin -hydrochlorothiazide -lithium -medicines for pain, sleep, or muscle relaxation -metformin -methazolamide -NSAIDS, medicines for pain and inflammation, like ibuprofen or naproxen -pioglitazone -risperidone This list may not describe all possible interactions. Give your health care provider a list of all the medicines, herbs, non-prescription drugs, or dietary supplements you use. Also tell them if you smoke, drink alcohol, or use illegal drugs. Some items may interact with your medicine. What should I watch for while using this medicine? Visit your doctor or health care professional for regular checks on your progress. Do not stop taking this medicine suddenly. This increases the risk of seizures if you are using this medicine to control epilepsy. Wear a medical identification bracelet or chain to say you have epilepsy or seizures, and carry a card that lists all your medicines. This medicine can decrease sweating and increase your body temperature. Watch for signs of deceased sweating or fever, especially in children. Avoid extreme  heat, hot baths, and saunas. Be careful about exercising, especially in hot weather. Contact your health care provider right away  if you notice a fever or decrease in sweating. You should drink plenty of fluids while taking this medicine. If you have had kidney stones in the past, this will help to reduce your chances of forming kidney stones. If you have stomach pain, with nausea or vomiting and yellowing of your eyes or skin, call your doctor immediately. You may get drowsy, dizzy, or have blurred vision. Do not drive, use machinery, or do anything that needs mental alertness until you know how this medicine affects you. To reduce dizziness, do not sit or stand up quickly, especially if you are an older patient. Alcohol can increase drowsiness and dizziness. Avoid alcoholic drinks. If you notice blurred vision, eye pain, or other eye problems, seek medical attention at once for an eye exam. The use of this medicine may increase the chance of suicidal thoughts or actions. Pay special attention to how you are responding while on this medicine. Any worsening of mood, or thoughts of suicide or dying should be reported to your health care professional right away. This medicine may increase the chance of developing metabolic acidosis. If left untreated, this can cause kidney stones, bone disease, or slowed growth in children. Symptoms include breathing fast, fatigue, loss of appetite, irregular heartbeat, or loss of consciousness. Call your doctor immediately if you experience any of these side effects. Also, tell your doctor about any surgery you plan on having while taking this medicine since this may increase your risk for metabolic acidosis. Birth control pills may not work properly while you are taking this medicine. Talk to your doctor about using an extra method of birth control. Women who become pregnant while using this medicine may enroll in the [place] Pregnancy Registry by calling [phone]. This registry collects information about the safety of antiepileptic drug use during pregnancy. What side  effects may I notice from receiving this medicine? Side effects that you should report to your doctor or health care professional as soon as possible: -allergic reactions like skin rash, itching or hives, swelling of the face, lips, or tongue -decreased sweating and/or rise in body temperature -depression -difficulty breathing, fast or irregular breathing patterns -difficulty speaking -difficulty walking or controlling muscle movements -hearing impairment -redness, blistering, peeling or loosening of the skin, including inside the mouth -tingling, pain or numbness in the hands or feet -unusual bleeding or bruising -unusually weak or tired -worsening of mood, thoughts or actions of suicide or dying Side effects that usually do not require medical attention (report to your doctor or health care professional if they continue or are bothersome): -altered taste -back pain, joint or muscle aches and pains -diarrhea, or constipation -headache -loss of appetite -nausea -stomach upset, indigestion -tremors This list may not describe all possible side effects. Call your doctor for medical advice about side effects. You may report side effects to FDA at 1-800-FDA-1088. Where should I keep my medicine? Keep out of the reach of children. Store at room temperature between 15 and 30 degrees C (59 and 86 degrees F) in a tightly closed container. Protect from moisture. Throw away any unused medicine after the expiration date. NOTE: This sheet is a summary. It may not cover all possible information. If you have questions about this medicine, talk to your doctor, pharmacist, or health care provider.    2016, [employer]. (2013-10-02 23:17:57)

## 2016-08-13 NOTE — Telephone Encounter (Signed)
Holly Jenkins/ [facility] called about a drug interaction topiramate (TOPAMAX) 25 MG tablet. It will increase metabolism of birth control with break through bleeding and ineffectiveness of birth control .Please call and advise [phone]

## 2016-08-13 NOTE — Telephone Encounter (Signed)
Dr Jaynee Eagles- please advise. How would you like me to address this?

## 2016-08-31 ENCOUNTER — Telehealth: Payer: Self-pay | Admitting: Neurology

## 2016-08-31 NOTE — Telephone Encounter (Signed)
Pt called to advise she is not tolerating the topamax. She advised when she increased it to 2 tabs/day (last 3 days) she began waking during the night and with increased anxiety. Sts she awoke during the night last night in a complete panic. She advised she is going to decrease to 1 tab/day. She is aware Dr Jaynee Eagles is out of the office and this can wait until then.

## 2016-09-01 NOTE — Telephone Encounter (Signed)
It takes 4-6 weeks for medication sto start working. She has ttried multiple medications. I would eally need to see her first to discuss thanks

## 2016-09-01 NOTE — Telephone Encounter (Signed)
I spoke to pt. I advised her that her medication could take up to 4-6 weeks to start working. To change medications, Dr. Jaynee Eagles really needs to see her in the office. I made an appt with pt for 09/14/2016 at 9:30am. Pt verbalized understanding of appt date and time.

## 2016-09-01 NOTE — Telephone Encounter (Signed)
Ask her to stay on one tab a day until her appointment in December then we can discuss changes thanks

## 2016-09-01 NOTE — Telephone Encounter (Signed)
Rn call patient to give her Dr. Jaynee Eagles advice. Rn stated per Dr.Ahern note to just take one tablet per day. PT stated she has been on if for 3 weeks and the medication is not working. PT is schedule for botox on 09/24/2016. Pt would like to know can something else be prescribed if possible.Message will be sent to Dr.Ahern.

## 2016-09-14 ENCOUNTER — Encounter: Payer: Self-pay | Admitting: Neurology

## 2016-09-14 ENCOUNTER — Ambulatory Visit (INDEPENDENT_AMBULATORY_CARE_PROVIDER_SITE_OTHER): Payer: 59 | Admitting: Neurology

## 2016-09-14 VITALS — BP 126/70 | HR 70 | Ht 68.0 in | Wt 161.8 lb

## 2016-09-14 DIAGNOSIS — G43709 Chronic migraine without aura, not intractable, without status migrainosus: Secondary | ICD-10-CM | POA: Diagnosis not present

## 2016-09-14 MED ORDER — DULOXETINE HCL 60 MG PO CPEP: 60.0000 mg | ORAL_CAPSULE | Freq: Every day | ORAL | 6 refills | Status: DC

## 2016-09-14 NOTE — Patient Instructions (Addendum)
Remember to drink plenty of fluid, eat healthy meals and do not skip any meals. Try to eat protein with a every meal and eat a healthy snack such as fruit or nuts in between meals. Try to keep a regular sleep-wake schedule and try to exercise daily, particularly in the form of walking, 20-30 minutes a day, if you can.   As far as your medications are concerned, I would like to suggest:  Start Cymbalta 60 mg daily Can take 1/2 tablet Lexapro for 3 days then every other day for 2 doses and stop  I would like to see you back in 3 months, sooner if we need to. Please call us with any interim questions, concerns, problems, updates or refill requests.   Our phone number is [phone]. We also have an after hours call service for urgent matters and there is a physician on-call for urgent questions. For any emergencies you know to call 911 or go to the nearest emergency room  Duloxetine delayed-release capsules What is this medicine? DULOXETINE (doo LOX e teen) is used to treat depression, anxiety, and different types of chronic pain. This medicine may be used for other purposes; ask your health care provider or pharmacist if you have questions. COMMON BRAND NAME(S): Cymbalta, Irenka What should I tell my health care provider before I take this medicine? They need to know if you have any of these conditions: -bipolar disorder or a family history of bipolar disorder -glaucoma -kidney disease -liver disease -suicidal thoughts or a previous suicide attempt -taken medicines called MAOIs like Carbex, Eldepryl, Marplan, Nardil, and Parnate within 14 days -an unusual reaction to duloxetine, other medicines, foods, dyes, or preservatives -pregnant or trying to get pregnant -breast-feeding How should I use this medicine? Take this medicine by mouth with a glass of water. Follow the directions on the prescription label. Do not cut, crush or chew this medicine. You can take this medicine with or without  food. Take your medicine at regular intervals. Do not take your medicine more often than directed. Do not stop taking this medicine suddenly except upon the advice of your doctor. Stopping this medicine too quickly may cause serious side effects or your condition may worsen. A special MedGuide will be given to you by the pharmacist with each prescription and refill. Be sure to read this information carefully each time. Talk to your pediatrician regarding the use of this medicine in children. While this drug may be prescribed for children as young as 64 years of age for selected conditions, precautions do apply. Overdosage: If you think you have taken too much of this medicine contact a poison control center or emergency room at once. NOTE: This medicine is only for you. Do not share this medicine with others. What if I miss a dose? If you miss a dose, take it as soon as you can. If it is almost time for your next dose, take only that dose. Do not take double or extra doses. What may interact with this medicine? Do not take this medicine with any of the following medications: -desvenlafaxine -levomilnacipran -linezolid -MAOIs like Carbex, Eldepryl, Marplan, Nardil, and Parnate -methylene blue (injected into a vein) -milnacipran -thioridazine -venlafaxine This medicine may also interact with the following medications: -alcohol -amphetamines -aspirin and aspirin-like medicines -certain antibiotics like ciprofloxacin and enoxacin -certain medicines for blood pressure, heart disease, irregular heart beat -certain medicines for depression, anxiety, or psychotic disturbances -certain medicines for migraine headache like almotriptan, eletriptan, frovatriptan, naratriptan, rizatriptan, sumatriptan,  zolmitriptan -certain medicines that treat or prevent blood clots like warfarin, enoxaparin, and dalteparin -cimetidine -fentanyl -lithium -NSAIDS, medicines for pain and inflammation, like ibuprofen or  naproxen -phentermine -procarbazine -rasagiline -sibutramine -St. John's wort -theophylline -tramadol -tryptophan This list may not describe all possible interactions. Give your health care provider a list of all the medicines, herbs, non-prescription drugs, or dietary supplements you use. Also tell them if you smoke, drink alcohol, or use illegal drugs. Some items may interact with your medicine. What should I watch for while using this medicine? Tell your doctor if your symptoms do not get better or if they get worse. Visit your doctor or health care professional for regular checks on your progress. Because it may take several weeks to see the full effects of this medicine, it is important to continue your treatment as prescribed by your doctor. Patients and their families should watch out for new or worsening thoughts of suicide or depression. Also watch out for sudden changes in feelings such as feeling anxious, agitated, panicky, irritable, hostile, aggressive, impulsive, severely restless, overly excited and hyperactive, or not being able to sleep. If this happens, especially at the beginning of treatment or after a change in dose, call your health care professional. Holly Jenkins may get drowsy or dizzy. Do not drive, use machinery, or do anything that needs mental alertness until you know how this medicine affects you. Do not stand or sit up quickly, especially if you are an older patient. This reduces the risk of dizzy or fainting spells. Alcohol may interfere with the effect of this medicine. Avoid alcoholic drinks. This medicine can cause an increase in blood pressure. This medicine can also cause a sudden drop in your blood pressure, which may make you feel faint and increase the chance of a fall. These effects are most common when you first start the medicine or when the dose is increased, or during use of other medicines that can cause a sudden drop in blood pressure. Check with your doctor for  instructions on monitoring your blood pressure while taking this medicine. Your mouth may get dry. Chewing sugarless gum or sucking hard candy, and drinking plenty of water may help. Contact your doctor if the problem does not go away or is severe. What side effects may I notice from receiving this medicine? Side effects that you should report to your doctor or health care professional as soon as possible: -allergic reactions like skin rash, itching or hives, swelling of the face, lips, or tongue -anxious -breathing problems -confusion -changes in vision -chest pain -confusion -elevated mood, decreased need for sleep, racing thoughts, impulsive behavior -eye pain -fast, irregular heartbeat -feeling faint or lightheaded, falls -feeling agitated, angry, or irritable -hallucination, loss of contact with reality -high blood pressure -loss of balance or coordination -palpitations -redness, blistering, peeling or loosening of the skin, including inside the mouth -restlessness, pacing, inability to keep still -seizures -stiff muscles -suicidal thoughts or other mood changes -trouble passing urine or change in the amount of urine -trouble sleeping -unusual bleeding or bruising -unusually weak or tired -vomiting -yellowing of the eyes or skin Side effects that usually do not require medical attention (report to your doctor or health care professional if they continue or are bothersome): -change in sex drive or performance -change in appetite or weight -constipation -dizziness -dry mouth -headache -increased sweating -nausea -tired This list may not describe all possible side effects. Call your doctor for medical advice about side effects. You may report side effects  to FDA at 1-800-FDA-1088. Where should I keep my medicine? Keep out of the reach of children. Store at room temperature between 20 and 25 degrees C (68 to 77 degrees F). Throw away any unused medicine after the  expiration date. NOTE: This sheet is a summary. It may not cover all possible information. If you have questions about this medicine, talk to your doctor, pharmacist, or health care provider.  2017 Elsevier/Gold Standard (2016-02-27 18:16:03)

## 2016-09-14 NOTE — Progress Notes (Signed)
[id number]    Provider:  Dr Jaynee Eagles Referring Provider: Aretta Nip, MD Primary Care Physician:  Aretta Nip, MD  CC:  migraines  Interval history 12 /01/2016:  She has restarted botox. She has had chronic migraines for over 20 years will failure of multiple medications. We retried Topamax last month and today she is here wanting to change again. She stopped Topamax after 2 weeks due to side effects, we do not have a good history she can't remember all the medications she tried so the medications tried are possible, all she knows for sure is that she had side effects to b-blockers.   HPI:  Holly Jenkins is a 40 y.o. female here as a referral for migraines. PMHx migraines, cervical dystonia, fibromyalgia. Started 20 years ago, Continuous birth control helped a lot. Trigger is alcohol. Neck pain is a trigger. She has confusion, light sensitivity, nausea, no vomiting, bilateral, tightness, pressure. When severe she needs a dark room and to lay still and cool. No aura. Wakes up with them and more at night as well. When she has one, slowly progresses to 6/10 on average. They usually last 24 hours. The cambia and rizatriptan. She has daily headaches. 8 days a month are migrainous. She was due on botox the 24tj of July. She has neck pain and a diagnosis of cervical dystonia. She has tried muscle relaxers, chiropractic, massage, physical therapy. Very tight progressive muscle pain. Additional botox in the shoulders has significantly helped with the pain and limited range of motion. Previous to botox in the cervical muscles she had a 5/10 in pain all the time significantly improved. Tizanidine does not help the cervical dystonia that it does help with the migraines. Patient is also on clonazepam which does not help with cervical dystonia. She has also tried Robaxin  Medications tried: Unclear, this is from memory. Topiramate, nortriptyline, propranolol, lyrica, "a  million preventatives", tizanidine, rizatriptan and other triptans she cannot remember, cambia, Zanaflex, Maxalt, trigger point injections in her cervical muscles, massage, Robaxin.  Reviewed notes, labs and imaging from outside physicians, which showed: Patient is being seen at [facility] in [place]. Patient has been receiving Botox injections. Per notes, Botox decrease his frequency and severity significantly, patient headaches and migraines increase when she misses her Botox injections. The headaches are mostly frontotemporal and size of the head. Some right base of her skull and she also has some discomfort in her posterior neck and down her shoulders and she has had spine surgery as well. Multifactorial situation. It appears they injected 250 units of Botox. 155 for Botox migraine protocol and additional 100 for cervical dystonia. They injected the areas where she had most of her pain as well as in her shoulders and neck. She sees a pain specialist as well for her neck pain. She was diagnosed with chronic daily migraine. It appears they also used additional Botox in the front half of her head and also in the nuchal ridge and down her neck. She has had multiple side effects from medications. She is on Zanaflex currently. Patient also is having some radiculopathy into her right arm, the Botox helped in the cervical muscles. She has noticed wearing off effect the 2 weeks prior to her Botox injections.  Reviewed MRI of the brain report. MRI of the brain 05/10/2013. Unremarkable MRI of the brain.   Review of Systems: Patient complains of symptoms per HPI as well as the following symptoms: Fatigue, headaches, dizziness, joint  pain, anxiety, decreased energy, allergies . Pertinent negatives per HPI. All others negative.  Social History   Social History  . Marital status: Married    Spouse name: N/A  . Number of children: N/A  . Years of education: N/A   Occupational  History  . Not on file.   Social History Main Topics  . Smoking status: Never Smoker  . Smokeless tobacco: Never Used  . Alcohol use No  . Drug use: No  . Sexual activity: Not on file   Other Topics Concern  . Not on file   Social History Narrative  . No narrative on file    Family History  Problem Relation Age of Onset  . Healthy Mother   . Diabetes Maternal Grandmother     Past Medical History:  Diagnosis Date  . Anxiety   . Fibromyalgia   . Migraines     Past Surgical History:  Procedure Laterality Date  . CERVICAL SPINE SURGERY     c5-c6-c7 fusion with disc transplants    Current Outpatient Prescriptions  Medication Sig Dispense Refill  . clonazePAM (KLONOPIN) 0.5 MG tablet Take 0.5 mg by mouth 2 (two) times daily as needed for anxiety.    . Diclofenac Potassium (CAMBIA) 50 MG PACK Take by mouth.    . escitalopram (LEXAPRO) 10 MG tablet Take 10 mg by mouth daily.    Marland Kitchen ethynodiol-ethinyl estradiol (KELNOR,ZOVIA) 1-35 MG-MCG tablet Take 1 tablet by mouth daily.    Marland Kitchen OVER THE COUNTER MEDICATION Take 1 tablet by mouth 2 (two) times daily. N acetyl glucosamine 700mg  daily     . pregabalin (LYRICA) 150 MG capsule Take 150 mg by mouth daily.    . Probiotic Product (PROBIOTIC PO) Take by mouth.    . rizatriptan (MAXALT-MLT) 10 MG disintegrating tablet Take 10 mg by mouth as needed for migraine. May repeat in 2 hours if needed    . tiZANidine (ZANAFLEX) 4 MG tablet Take 4 mg by mouth at bedtime. Take 1 to 1.5 tablets daily    . topiramate (TOPAMAX) 25 MG tablet Take 1 tablet PO at bedtime for 1 week, then 2 tablets for 1 week then three tablets at bedtime therafter 90 tablet 11   No current facility-administered medications for this visit.     Allergies as of 09/14/2016 - Review Complete 09/14/2016  Allergen Reaction Noted  . Dust mite extract  05/26/2016    Vitals: BP 126/70 (BP Location: Right Arm, Patient Position: Sitting, Cuff Size: Normal)   Pulse 70   Ht  5\' 8"  (1.727 m)   Wt 161 lb 12.8 oz (73.4 kg)   BMI 24.60 kg/m  Last Weight:  Wt Readings from Last 1 Encounters:  09/14/16 161 lb 12.8 oz (73.4 kg)   Last Height:   Ht Readings from Last 1 Encounters:  09/14/16 5\' 8"  (1.727 m)     Physical exam: Exam: Gen: NAD, conversant, well nourised, well groomed                     CV: RRR, no MRG. No Carotid Bruits. No peripheral edema, warm, nontender Eyes: Conjunctivae clear without exudates or hemorrhage MSK: Hypertrophied bilateral trapezius muscles, elevated shoulders bilaterally, decreased range of motion of the head, tenderness to palpation.  Neuro: Detailed Neurologic Exam  Speech:    Speech is normal; fluent and spontaneous with normal comprehension.  Cognition:    The patient is oriented to person, place, and time;     recent  and remote memory intact;     language fluent;     normal attention, concentration,     fund of knowledge Cranial Nerves:    The pupils are equal, round, and reactive to light. The fundi are normal and spontaneous venous pulsations are present. Visual fields are full to finger confrontation. Extraocular movements are intact. Trigeminal sensation is intact and the muscles of mastication are normal. The face is symmetric. The palate elevates in the midline. Hearing intact. Voice is normal. Shoulder shrug is normal. The tongue has normal motion without fasciculations.   Coordination:    Normal finger to nose and heel to shin. Normal rapid alternating movements.   Gait:    Heel-toe and tandem gait are normal.   Motor Observation:    No asymmetry, no atrophy, and no involuntary movements noted. Tone:    Normal muscle tone.    Posture:    Posture is normal. normal erect    Strength:    Strength is V/V in the upper and lower limbs.      Sensation: intact to LT     Reflex Exam:  DTR's:    Deep tendon reflexes in the upper and lower extremities are normal bilaterally.   Toes:    The toes  are downgoing bilaterally.   Clonus:    Clonus is absent.      Assessment/Plan:  40 year old female with chronic migraines without aura and without status migrainosus not intractable. Also with cervical dystonia.   Continue Botox for migraine:   Cervical dystonia: Patient has been receiving injections into her trapezius muscles bilaterally. She does have hypertrophied trapezius muscles, decreased range of motion, elevated shoulders. She has tried muscle relaxers, physical therapy, pain injections and trigger point injections, massage without relief progressively worsening. Patient has received Botox injections into these muscles with relief. We'll request that we continue that 100 additional units.  emember to drink plenty of fluid, eat healthy meals and do not skip any meals. Try to eat protein with a every meal and eat a healthy snack such as fruit or nuts in between meals. Try to keep a regular sleep-wake schedule and try to exercise daily, particularly in the form of walking, 20-30 minutes a day, if you can.   As far as your medications are concerned, I would like to suggest:  Start Cymbalta 60 mg daily, will stop Lexapro(Cymbalta more effective for migraines). But if mood worsens call immediately  Discussed: To prevent or relieve headaches, try the following: Cool Compress. Lie down and place a cool compress on your head.  Avoid headache triggers. If certain foods or odors seem to have triggered your migraines in the past, avoid them. A headache diary might help you identify triggers.  Include physical activity in your daily routine. Try a daily walk or other moderate aerobic exercise.  Manage stress. Find healthy ways to cope with the stressors, such as delegating tasks on your to-do list.  Practice relaxation techniques. Try deep breathing, yoga, massage and visualization.  Eat regularly. Eating regularly scheduled meals and maintaining a healthy diet might help prevent headaches.  Also, drink plenty of fluids.  Follow a regular sleep schedule. Sleep deprivation might contribute to headaches Consider biofeedback. With this mind-body technique, you learn to control certain bodily functions - such as muscle tension, heart rate and blood pressure - to prevent headaches or reduce headache pain.    Proceed to emergency room if you experience new or worsening symptoms or symptoms do not resolve, if  you have new neurologic symptoms or if headache is severe, or for any concerning symptom.     Cc: Dr Radene Ou  Sarina Ill, MD  [facility] [address] [place], [place] [address]  Phone [phone] Fax [phone]   total of 30 minutes was spent face-to-face with this patient. Over half this time was spent on counseling patient on the migraine diagnosis and different diagnostic and therapeutic options available.

## 2016-09-24 ENCOUNTER — Encounter: Payer: Self-pay | Admitting: *Deleted

## 2016-09-24 ENCOUNTER — Ambulatory Visit (INDEPENDENT_AMBULATORY_CARE_PROVIDER_SITE_OTHER): Payer: 59 | Admitting: Neurology

## 2016-09-24 VITALS — BP 126/78 | HR 73 | Ht 68.0 in

## 2016-09-24 DIAGNOSIS — G43709 Chronic migraine without aura, not intractable, without status migrainosus: Secondary | ICD-10-CM | POA: Diagnosis not present

## 2016-09-24 NOTE — Progress Notes (Signed)
Botox-100unitsx2 vials Lot: [id number] Expiration: 03/2019 NDC: [id number]  0.9% Sodium Chloride bacteriostatic- 71mL total Lot: [id number] Expiration: 03/12/2018 NDC: [id number]  Dx: [id number] B/B

## 2016-09-24 NOTE — Progress Notes (Signed)

## 2016-09-24 NOTE — Progress Notes (Signed)
Sandy Salaam Signed form to get additional units for cervical dystonia for pt per AA,MD request. Dx: G24.3.

## 2016-10-01 ENCOUNTER — Encounter: Payer: Self-pay | Admitting: Neurology

## 2016-10-06 ENCOUNTER — Other Ambulatory Visit: Payer: Self-pay | Admitting: Neurology

## 2016-10-07 ENCOUNTER — Encounter: Payer: Self-pay | Admitting: *Deleted

## 2016-10-07 NOTE — Telephone Encounter (Signed)
Pt was last seen 2 wks ago for Botox. She has a follow-up scheduled in March.

## 2016-10-07 NOTE — Progress Notes (Signed)
Faxed cambia enrollment form to [place]. Fax: [phone]. Received confirmation. Quantity: 9, max allowable for refills.

## 2016-10-19 ENCOUNTER — Telehealth: Payer: Self-pay | Admitting: Neurology

## 2016-10-19 NOTE — Telephone Encounter (Signed)
Emma, I would try some atenolol, it is a different beta-blocker. The thing to watch out for is dizziness, chest pain due to low blood pressure and low pulse but I would start on a low dose. Is she willing to try? Some other side effects are as below, this is not a complete list and if she has anything concerning she should stop. Thanks!  Common Side Effects of Atenolol: Constipation, indigestion  Dizziness or faintness  Dry mouth  Impotence  Cold extremities hands and feet  Confusion  Depression  Insomnia, nightmares  Edema  Fatigue, lack of energy Serious Side Effects of Atenolol: Low blood pressure  Hallucinations  Sensation of pins and needles  Skin reactions, rash, hives, psoriasis  Blurred vision  Difficulty speaking, or hearing  Unsteadiness  Wheezing or noisy breathing Seek medical help immediately if you experience: Slow, uneven heart beats  Fainting, feeling light headed  Swelling of your feet or ankles  Shortness of breath  Nausea, fever, dark urine, loss of appetite  Depression

## 2016-10-19 NOTE — Telephone Encounter (Signed)
Pt called said she has stopped the cymbalta and has switched back to lexapro. She is wanting to know if Dr A wants to try other alternative medicines. Pt sent to 2 emails on 10/01/16. Pt said she needs refill for Diclofenac Potassium (CAMBIA) 50 MG PACK sent to [facility] .

## 2016-10-19 NOTE — Telephone Encounter (Signed)
Called and LVM for pt to call back and discuss message below from AA,MD. Gave [place] phone number and hours.

## 2016-10-19 NOTE — Telephone Encounter (Signed)
Called and spoke to pt. Advised AA,MD received her messages. She is going to review everything and see what she would like her to do. I will call her back after I receive answer from Dr Jaynee Eagles. She verbalized understanding   Relayed refill for [facility] sent to [place]. I gave her their contact information to follow up on this.

## 2016-10-19 NOTE — Telephone Encounter (Signed)
Refill for [facility] sent on 10/07/17.  "Faxed cambia enrollment form to [employer]. Fax: [phone]. Received confirmation. Quantity: 9, max allowable for refills" Phone. [phone]

## 2016-10-20 ENCOUNTER — Other Ambulatory Visit: Payer: Self-pay | Admitting: Neurology

## 2016-10-20 MED ORDER — ATENOLOL 25 MG PO TABS: 25.0000 mg | ORAL_TABLET | Freq: Every day | ORAL | 6 refills | Status: DC

## 2016-10-20 NOTE — Telephone Encounter (Signed)
Called and spoke to pt. Relayed instructions per AA,MD note. She verbalized understanding. No further questions at this time.

## 2016-10-20 NOTE — Telephone Encounter (Signed)
Patient is returning your call.  

## 2016-10-20 NOTE — Telephone Encounter (Signed)
25 mg once a day take 4-6 weeks to really work at which time I would likely increase to 50mg  as long as she doesn;t have side effects thanks!

## 2016-10-20 NOTE — Telephone Encounter (Signed)
Called and spoke to pt. She is agreeable to try atenolol instead. She wanted to know specific instructions. Advised AA,MD has not called in rx yet because she wanted me to speak with her first. Advised I will speak with Dr Jaynee Eagles and call her back to go over instructions. She verbalized understanding.

## 2016-10-22 ENCOUNTER — Telehealth: Payer: Self-pay | Admitting: Neurology

## 2016-10-22 NOTE — Telephone Encounter (Signed)
Dr Ahern- please advise 

## 2016-10-22 NOTE — Telephone Encounter (Signed)
Pt called said her dog has anaplasmosis and she is wondering if she could get a tick panel done that could help r/o if that is what is  causing joint and muscle pain.

## 2016-10-22 NOTE — Telephone Encounter (Signed)
I would refer her to her primary care, this is not my area of expertise and I really don;t know what tests to order or I would certainly love to help. Her pcp would know more about this, thanks

## 2016-10-22 NOTE — Telephone Encounter (Signed)
Called and LVM for pt relaying AA,MD note below. Gave [facility] phone number if she has further questions or concerns.

## 2016-10-27 ENCOUNTER — Telehealth: Payer: Self-pay | Admitting: Neurology

## 2016-10-27 NOTE — Telephone Encounter (Signed)
Patient is calling in reference to atenolol (TENORMIN) 25 MG tablet.  Patient states BP is running very low at 102/60, also yesterday and today she was seeing black spots lasting a few hrs.  Patient is wanting to know if she needs to discontinue medication. Please call

## 2016-10-27 NOTE — Telephone Encounter (Signed)
Called and spoke to pt. Advised per AA,MD for her to stop medication and see if her symptoms resolve. Asked her to call back and update [place] to see if symptoms get better. She verbalized understanding.   She states she is feeling hot/sweaty and faint usually around 2 or 3pm. She took the medication first thing in the morning.

## 2016-10-27 NOTE — Telephone Encounter (Signed)
Dr Jaynee Eagles- Patient just started medication on 10/20/16. Please advise. What would you recommend if you want her to stop the medication?

## 2016-10-27 NOTE — Telephone Encounter (Signed)
Ask her to stop it thanks

## 2016-11-04 ENCOUNTER — Telehealth: Payer: Self-pay | Admitting: Neurology

## 2016-11-04 DIAGNOSIS — G43709 Chronic migraine without aura, not intractable, without status migrainosus: Secondary | ICD-10-CM

## 2016-11-04 MED ORDER — ONABOTULINUMTOXINA 100 UNITS IJ SOLR: INTRAMUSCULAR | 3 refills | Status: AC

## 2016-11-04 NOTE — Telephone Encounter (Signed)
Dr Jaynee Eagles- I had sent this to you previously. Please advise.  I have not heard back from her yet

## 2016-11-04 NOTE — Telephone Encounter (Signed)
Holly Jenkins, would you please send an rx for 100 units of Botox and a qty of 2 to [place]?

## 2016-11-04 NOTE — Telephone Encounter (Signed)
I would try something else let me know if she calls back and i'll take a look and see what else we can do. thanks

## 2016-11-10 NOTE — Telephone Encounter (Signed)
Called and LVM for pt to see how she is feeling since stopping atenolol. I have not heard back from her. Asked her to call back and let us know. Gave [facility] phone number.

## 2016-11-11 NOTE — Telephone Encounter (Signed)
Patient returned nurse's call in reference to atenolol.  Per patient it took about 4 days until BP came back normal, and patient states she is doing fine.

## 2016-11-11 NOTE — Telephone Encounter (Signed)
Dr Jaynee Eagles- would you like to do anything different?  See message below

## 2016-12-24 ENCOUNTER — Ambulatory Visit (INDEPENDENT_AMBULATORY_CARE_PROVIDER_SITE_OTHER): Payer: 59 | Admitting: Neurology

## 2016-12-24 ENCOUNTER — Encounter: Payer: Self-pay | Admitting: Neurology

## 2016-12-24 VITALS — BP 124/79 | HR 74 | Ht 68.0 in | Wt 158.4 lb

## 2016-12-24 DIAGNOSIS — G43709 Chronic migraine without aura, not intractable, without status migrainosus: Secondary | ICD-10-CM

## 2016-12-24 MED ORDER — DIVALPROEX SODIUM ER 250 MG PO TB24: 250.0000 mg | ORAL_TABLET | Freq: Every day | ORAL | 6 refills | Status: DC

## 2016-12-24 NOTE — Patient Instructions (Signed)
Remember to drink plenty of fluid, eat healthy meals and do not skip any meals. Try to eat protein with a every meal and eat a healthy snack such as fruit or nuts in between meals. Try to keep a regular sleep-wake schedule and try to exercise daily, particularly in the form of walking, 20-30 minutes a day, if you can.   As far as your medications are concerned, I would like to suggest: depakote at bedtime  I would like to see you back in 12 weeks, sooner if we need to. Please call us with any interim questions, concerns, problems, updates or refill requests.   Our phone number is [phone]. We also have an after hours call service for urgent matters and there is a physician on-call for urgent questions. For any emergencies you know to call 911 or go to the nearest emergency room  Valproic Acid, Divalproex Sodium delayed or extended-release tablets What is this medicine? DIVALPROEX SODIUM (dye VAL pro ex SO dee um) is used to prevent seizures caused by some forms of epilepsy. It is also used to treat bipolar mania and to prevent migraine headaches. This medicine may be used for other purposes; ask your health care provider or pharmacist if you have questions. COMMON BRAND NAME(S): Depakote, Depakote ER What should I tell my health care provider before I take this medicine? They need to know if you have any of these conditions: -if you often drink alcohol -kidney disease -liver disease -low platelet counts -mitochondrial disease -suicidal thoughts, plans, or attempt; a previous suicide attempt by you or a family member -urea cycle disorder (UCD) -an unusual or allergic reaction to divalproex sodium, sodium valproate, valproic acid, other medicines, foods, dyes, or preservatives -pregnant or trying to get pregnant -breast-feeding How should I use this medicine? Take this medicine by mouth with a drink of water. Follow the directions on the prescription label. Do not cut, crush or chew this  medicine. You can take it with or without food. If it upsets your stomach, take it with food. Take your medicine at regular intervals. Do not take it more often than directed. Do not stop taking except on your doctor's advice. A special MedGuide will be given to you by the pharmacist with each prescription and refill. Be sure to read this information carefully each time. Talk to your pediatrician regarding the use of this medicine in children. While this drug may be prescribed for children as young as 10 years for selected conditions, precautions do apply. Overdosage: If you think you have taken too much of this medicine contact a poison control center or emergency room at once. NOTE: This medicine is only for you. Do not share this medicine with others. What if I miss a dose? If you miss a dose, take it as soon as you can. If it is almost time for your next dose, take only that dose. Do not take double or extra doses. What may interact with this medicine? Do not take this medicine with any of the following medications: -sodium phenylbutyrate This medicine may also interact with the following medications: -aspirin -certain antibiotics like ertapenem, imipenem, meropenem -certain medicines for depression, anxiety, or psychotic disturbances -certain medicines for seizures like carbamazepine, clonazepam, diazepam, ethosuximide, felbamate, lamotrigine, phenobarbital, phenytoin, primidone, rufinamide, topiramate -certain medicines that treat or prevent blood clots like warfarin -cholestyramine -female hormones, like estrogens and birth control pills, patches, or rings -propofol -rifampin -ritonavir -tolbutamide -zidovudine This list may not describe all possible interactions. Give your  health care provider a list of all the medicines, herbs, non-prescription drugs, or dietary supplements you use. Also tell them if you smoke, drink alcohol, or use illegal drugs. Some items may interact with your  medicine. What should I watch for while using this medicine? Tell your doctor or healthcare professional if your symptoms do not get better or they start to get worse. Wear a medical ID bracelet or chain, and carry a card that describes your disease and details of your medicine and dosage times. You may get drowsy, dizzy, or have blurred vision. Do not drive, use machinery, or do anything that needs mental alertness until you know how this medicine affects you. To reduce dizzy or fainting spells, do not sit or stand up quickly, especially if you are an older patient. Alcohol can increase drowsiness and dizziness. Avoid alcoholic drinks. This medicine can make you more sensitive to the sun. Keep out of the sun. If you cannot avoid being in the sun, wear protective clothing and use sunscreen. Do not use sun lamps or tanning beds/booths. Patients and their families should watch out for new or worsening depression or thoughts of suicide. Also watch out for sudden changes in feelings such as feeling anxious, agitated, panicky, irritable, hostile, aggressive, impulsive, severely restless, overly excited and hyperactive, or not being able to sleep. If this happens, especially at the beginning of treatment or after a change in dose, call your health care professional. Women should inform their doctor if they wish to become pregnant or think they might be pregnant. There is a potential for serious side effects to an unborn child. Talk to your health care professional or pharmacist for more information. Women who become pregnant while using this medicine may enroll in the [place] Pregnancy Registry by calling [phone]. This registry collects information about the safety of antiepileptic drug use during pregnancy. What side effects may I notice from receiving this medicine? Side effects that you should report to your doctor or health care professional as soon as possible: -allergic  reactions like skin rash, itching or hives, swelling of the face, lips, or tongue -changes in vision -redness, blistering, peeling or loosening of the skin, including inside the mouth -signs and symptoms of liver injury like dark yellow or brown urine; general ill feeling or flu-like symptoms; light-colored stools; loss of appetite; nausea; right upper belly pain; unusually weak or tired; yellowing of the eyes or skin -suicidal thoughts or other mood changes -unusual bleeding or bruising Side effects that usually do not require medical attention (report to your doctor or health care professional if they continue or are bothersome): -constipation -diarrhea -dizziness -hair loss -headache -loss of appetite -weight gain This list may not describe all possible side effects. Call your doctor for medical advice about side effects. You may report side effects to FDA at 1-800-FDA-1088. Where should I keep my medicine? Keep out of reach of children. Store at room temperature between 15 and 30 degrees C (59 and 86 degrees F). Keep container tightly closed. Throw away any unused medicine after the expiration date. NOTE: This sheet is a summary. It may not cover all possible information. If you have questions about this medicine, talk to your doctor, pharmacist, or health care provider.  2018 Elsevier/Gold Standard (2016-01-02 07:11:40)

## 2016-12-24 NOTE — Progress Notes (Signed)

## 2016-12-29 ENCOUNTER — Telehealth: Payer: Self-pay | Admitting: Neurology

## 2016-12-29 NOTE — Telephone Encounter (Signed)
Referral for integrative therapy has been sent via fax.

## 2016-12-29 NOTE — Telephone Encounter (Signed)
Pt called to schedule next botox appt in 3 mths. Please call

## 2016-12-29 NOTE — Telephone Encounter (Signed)
I called and scheduled the patient.  °

## 2017-01-13 ENCOUNTER — Telehealth: Payer: Self-pay

## 2017-01-13 NOTE — Telephone Encounter (Signed)
Received faxed PT eval w/ plan for visits 1-2x/wk for 8-12 wks. Placed in Dr. Cathren Laine box for review.

## 2017-02-17 ENCOUNTER — Telehealth: Payer: Self-pay | Admitting: Neurology

## 2017-02-17 NOTE — Telephone Encounter (Signed)
Pt would like a call to discuss her divalproex (DEPAKOTE ER) 250 MG 24 hr tablet   Pt also has questions about medication: [employer]

## 2017-02-17 NOTE — Telephone Encounter (Signed)
Returned pt TC. Let her know that new med - erunamab (CGRP) has not been released yet. The med does look promising for migraine treatment but will probably not come out before next month. Left VM mssg to return call with further questions.

## 2017-02-17 NOTE — Telephone Encounter (Signed)
Pt requesting  A call back re: botox

## 2017-02-18 NOTE — Telephone Encounter (Signed)
I returned the patients call and left her VM asking her to call me back.

## 2017-02-23 NOTE — Telephone Encounter (Addendum)
Pt returned RN's call. RN was skyped. She relayed Andee Poles was calling the pt about botox. By this time the pt had hung up. Please call

## 2017-02-23 NOTE — Telephone Encounter (Signed)
I spoke with the patient regarding her SP charge for botox. I gave her information regarding the savings program.

## 2017-03-04 ENCOUNTER — Telehealth: Payer: Self-pay | Admitting: Neurology

## 2017-03-04 MED ORDER — RIZATRIPTAN BENZOATE 10 MG PO TBDP: 10.0000 mg | ORAL_TABLET | ORAL | 11 refills | Status: DC | PRN

## 2017-03-04 NOTE — Telephone Encounter (Signed)
Patient called office requesting refill for rizatriptan (MAXALT-MLT) 10 MG disintegrating tablet.  Pharmacy- [facility].

## 2017-03-04 NOTE — Addendum Note (Signed)
Addended by: Monte Fantasia on: 03/04/2017 03:19 PM   Modules accepted: Orders

## 2017-03-04 NOTE — Telephone Encounter (Signed)
Refills e-scribed as requested.

## 2017-03-05 NOTE — Telephone Encounter (Signed)
Spoke to patient who stated the Depakote did not help her insomnia , even when taking a different times of day. So she stopped taking. However she stated she did like how it helped headaches, so she is asking if there's a lower dose she can take. Confirmed her pharmacy and advised will send her request to Dr Jaynee Eagles. She is aware our office closes at noon today, is closed Monday. She verbalized understanding, appreciation of call back.

## 2017-03-05 NOTE — Telephone Encounter (Signed)
Pt calling to discuss divalproex (DEPAKOTE ER) 250 MG 24 hr tablet, she initially asked for [facility] [place] , when advised she is not in today she is asking for available nurse to call her back to discuss depakote

## 2017-03-09 ENCOUNTER — Other Ambulatory Visit: Payer: Self-pay | Admitting: Neurology

## 2017-03-09 MED ORDER — DIVALPROEX SODIUM 125 MG PO DR TAB: 125.0000 mg | DELAYED_RELEASE_TABLET | Freq: Every day | ORAL | 6 refills | Status: DC

## 2017-03-09 NOTE — Telephone Encounter (Signed)
LVM informing patient that Dr Holly Jenkins called in decreased dose of Depakote as she requested. Advised her that Dr Holly Jenkins stated not to get pregnant on this medication as it will cause harm to the embryo or fetus. Advised she must use strong birth control while on this medication.  Repeated this warning again and gave her number for any questions.

## 2017-03-09 NOTE — Telephone Encounter (Signed)
I called in Depakote 125mg . Please remind her not to get pregnant on this medication as it is teratogenic.thanks

## 2017-03-10 ENCOUNTER — Telehealth: Payer: Self-pay | Admitting: Neurology

## 2017-03-10 MED ORDER — DIVALPROEX SODIUM 125 MG PO DR TAB: 125.0000 mg | DELAYED_RELEASE_TABLET | Freq: Every day | ORAL | 5 refills | Status: DC

## 2017-03-10 NOTE — Addendum Note (Signed)
Addended by: Monte Fantasia on: 03/10/2017 10:25 AM   Modules accepted: Orders

## 2017-03-10 NOTE — Telephone Encounter (Signed)
Rx refills re-sent to correct local pharmacy as requested.

## 2017-03-10 NOTE — Telephone Encounter (Signed)
Patients called requesting refill for divalproex (DEPAKOTE) 125 MG DR tablet.. [facility] [address], originally sent to wrong pharmacy.

## 2017-03-17 NOTE — Telephone Encounter (Signed)
Pt called said she rec'd a call from the specialty pharmacy requesting an approval to ship the medication. She said the medication was shipped a couple of months early per [facility]. She is needing clarification before she calls pharmacy back. Please call

## 2017-03-17 NOTE — Telephone Encounter (Signed)
I called and spoke to [facility] regarding the medication and confirmed that we already have her botox here.

## 2017-03-19 NOTE — Telephone Encounter (Signed)
Additional PT progress report from 02/25/17 - Plans to continue visits weekly x 8 wks, the re-assess to determine the need for continued treatment.

## 2017-03-31 ENCOUNTER — Ambulatory Visit (INDEPENDENT_AMBULATORY_CARE_PROVIDER_SITE_OTHER): Payer: 59 | Admitting: Neurology

## 2017-03-31 VITALS — BP 116/72 | HR 70 | Ht 68.0 in | Wt 162.6 lb

## 2017-03-31 DIAGNOSIS — G43709 Chronic migraine without aura, not intractable, without status migrainosus: Secondary | ICD-10-CM

## 2017-03-31 NOTE — Progress Notes (Signed)

## 2017-03-31 NOTE — Progress Notes (Signed)
Botox 100 units/vial x 2 vials from [place] [phone] Lot [id number] Exp 09 2020  Diluted in 4 ml of Bacteriostatic 0.9% NaCl NDC 6060-0459-97 Lot [id number] Exp [id number]  Topical pain reliever applied to injection areas w/ gauze Lidocaine 5% cream NDC 32023-343-56 Lot [id number] Exp 06/19

## 2017-04-26 ENCOUNTER — Telehealth: Payer: Self-pay

## 2017-04-26 NOTE — Telephone Encounter (Signed)
Received notes from [facility]. Dr. Girtha Rm says that "treatment will be directed toward decreasing the forces generated by the muscles of mastication w/ the use of intraoral appliance at night and the use of cognitive awareness followed by behavioral modification during the day. In addition physical therapy techniques will be utilized in an attempt to decrease the resting tone of the muscles of mastication and removal of trigger points. The patient's prognosis is guarded at this time and will be a function of patient compliance and how well contributing factors to the condition can be eliminated." Sent to med records for scanning, copy to Dr. Jaynee Eagles for review.

## 2017-07-08 ENCOUNTER — Encounter: Payer: Self-pay | Admitting: Neurology

## 2017-07-08 ENCOUNTER — Ambulatory Visit (INDEPENDENT_AMBULATORY_CARE_PROVIDER_SITE_OTHER): Payer: 59 | Admitting: Neurology

## 2017-07-08 VITALS — BP 118/83 | HR 67 | Ht 68.0 in | Wt 161.6 lb

## 2017-07-08 DIAGNOSIS — G43709 Chronic migraine without aura, not intractable, without status migrainosus: Secondary | ICD-10-CM | POA: Diagnosis not present

## 2017-07-08 NOTE — Progress Notes (Signed)

## 2017-07-08 NOTE — Progress Notes (Signed)
Botox-100unitsx2 vials Lot: [id number] Expiration: 01/2020 NDC: 3524-8185-90 [id number]  0.9% Sodium Chloride- 59mL total Lot: [id number] Expiration: 03/2018 NDC: 6950-7225-75

## 2017-08-03 ENCOUNTER — Telehealth: Payer: Self-pay

## 2017-08-03 NOTE — Telephone Encounter (Signed)
I received an prior auth request for aimovig. I have completed and submitted the PA and should have a determination within 48-72 hours.

## 2017-08-04 NOTE — Telephone Encounter (Signed)
Aimovig 70mg /mL has been approved for coverage until 11/03/2017.  File ID: [id number]

## 2017-08-23 ENCOUNTER — Telehealth: Payer: Self-pay | Admitting: Neurology

## 2017-08-23 NOTE — Telephone Encounter (Signed)
Patient stated that she used the sample of Ajovy and stated she would like a prescription for it. Her pharmacy is at [facility].

## 2017-08-24 ENCOUNTER — Other Ambulatory Visit: Payer: Self-pay | Admitting: Neurology

## 2017-08-24 MED ORDER — NONFORMULARY OR COMPOUNDED ITEM: 1.0000 "application " | 11 refills | Status: DC

## 2017-08-24 NOTE — Telephone Encounter (Signed)
I received a prior auth request for ajovy. I have completed and submitted the PA and should have a determination within 48-72 hours.  CoverMyMeds: (Key: [id number]) - [id number]

## 2017-08-24 NOTE — Telephone Encounter (Signed)
Fixed; thanks

## 2017-08-24 NOTE — Telephone Encounter (Signed)
Ordered, on printer thanks

## 2017-08-24 NOTE — Telephone Encounter (Signed)
Ajovy prescription faxed to [place] # [phone]. Received a receipt of confirmation.

## 2017-08-25 ENCOUNTER — Telehealth: Payer: Self-pay | Admitting: *Deleted

## 2017-08-25 NOTE — Telephone Encounter (Signed)
Noted in Cover My Meds that a PA for Ajovy 225 mg/1.5 mL [place] [employer] was completed on 11/13. Key: [id number]. [id number]. Awaiting response within 72 hours.

## 2017-08-26 ENCOUNTER — Encounter: Payer: Self-pay | Admitting: *Deleted

## 2017-08-26 ENCOUNTER — Telehealth: Payer: Self-pay | Admitting: Neurology

## 2017-08-26 NOTE — Telephone Encounter (Signed)
Holly Jenkins has been denied. See previous telephone encounter for Aimovig approval.   [phone]  Holly Jenkins can be approved if: Your patient has a history of failure, contraindication or intolerance to both of the following: (a) Divalproex sodium (Depakote/Depakote ER). (b) Venlafaxine (Effexor).

## 2017-08-26 NOTE — Telephone Encounter (Signed)
Felecia from pharmacy has called re: the discount card given for pt. It is still not going thru because a Prior Auth is needed.  Please call

## 2017-08-26 NOTE — Telephone Encounter (Signed)
[place] and clarified ID # on savings card. They were able to get the savings card to process.   Savings card: BIN# [id number] PCN# [id number] GRP# [id number] ID# [id number]

## 2017-08-26 NOTE — Telephone Encounter (Addendum)
I called patient. She is aware that the Ajovy was denied and that appeal will be needed. She stated she did not start Aimovig d/t certain side effects. She is getting ready to take another sample dose today. She is aware that a prescription has been sent to [facility] that we will call to give them the savings card information with hopes that she can continue to receive medication for free for 1 year.   [place] spoke with [employer] [employer]. I gave her the savings card numbers and informed her the copay should be $0 per fill until 10/11/18. She verbalized understanding and will call if any issues arise. I called the patient to update her. She verbalized understanding and will remind pharmacy that they have the savings card if issues arise with future medication fills.

## 2017-08-26 NOTE — Telephone Encounter (Signed)
Need to call patient and see if she has been on Aimovig. It is not in med list or mentioned in a note from what I can tell.

## 2017-08-26 NOTE — Telephone Encounter (Signed)
Ajovy appeal letter and office notes printed and ready for signature.    Patient has tried Depakote ER but not Effexor which are both meds insurance company inquired about on the denial letter. They would like to know why patient cannot take Effexor and Depakote.

## 2017-08-30 ENCOUNTER — Encounter: Payer: Self-pay | Admitting: *Deleted

## 2017-09-01 NOTE — Telephone Encounter (Signed)
Signed appeal letter and office notes faxed to [facility] Provider Appeals. Received a receipt of confirmation.

## 2017-09-16 NOTE — Telephone Encounter (Signed)
Pt returned RN's call. Msg relayed, she was very appreciative

## 2017-09-16 NOTE — Telephone Encounter (Signed)
Called patient to let her know of Ajovy approval. LVM asking for call back. When she calls back, please tell her that her insurance approved Ajovy.

## 2017-09-16 NOTE — Telephone Encounter (Signed)
Per cover my meds, appeal has been APPROVED!

## 2017-09-22 ENCOUNTER — Other Ambulatory Visit: Payer: Self-pay | Admitting: Neurology

## 2017-09-27 ENCOUNTER — Other Ambulatory Visit: Payer: Self-pay | Admitting: Neurology

## 2017-09-30 NOTE — Telephone Encounter (Signed)
error 

## 2017-10-04 ENCOUNTER — Other Ambulatory Visit: Payer: Self-pay | Admitting: Neurology

## 2017-10-07 ENCOUNTER — Telehealth: Payer: Self-pay | Admitting: Neurology

## 2017-10-07 NOTE — Telephone Encounter (Signed)
I called [facility]. I spoke to a pharm tech who reports that pt has been getting her maxalt filled 4 times per month because the RX does not specify how long the 10 tablets of maxalt is supposed to last. I advised them that 10 tablets of maxalt is supposed to last for 30 days.  I called pt. She says that she was "just doing what the pharmacy told me". I advised her of the potential risks of taking too many triptans, such as stroke, rebound headaches, and advised pt that Dr. Jaynee Eagles will not be refilling her maxalt until her appt on 10/20/17. Dr. Jaynee Eagles will discuss medication management with her at this appt. I informed pt that maxalt should not exceed 10 tablets per month, or 3 tablets in a 24 hour period. Pt verbalized understanding.

## 2017-10-07 NOTE — Telephone Encounter (Signed)
Pt called [facility] has advised she does not have any refills on rizatriptan (MAXALT-MLT) 10 MG disintegrating tablet. She was advised by the pharmacy she could take 4 pills at a time and get a refill again every 2 days, this is what she has been doing. Please call to advise.

## 2017-10-11 ENCOUNTER — Other Ambulatory Visit: Payer: Self-pay | Admitting: Neurology

## 2017-10-20 ENCOUNTER — Encounter: Payer: Self-pay | Admitting: Neurology

## 2017-10-20 ENCOUNTER — Ambulatory Visit (INDEPENDENT_AMBULATORY_CARE_PROVIDER_SITE_OTHER): Payer: 59 | Admitting: Neurology

## 2017-10-20 VITALS — BP 109/76 | HR 74

## 2017-10-20 DIAGNOSIS — G43709 Chronic migraine without aura, not intractable, without status migrainosus: Secondary | ICD-10-CM | POA: Diagnosis not present

## 2017-10-20 MED ORDER — METOCLOPRAMIDE HCL 10 MG PO TABS: 10.0000 mg | ORAL_TABLET | Freq: Four times a day (QID) | ORAL | 6 refills | Status: DC | PRN

## 2017-10-20 MED ORDER — NORTRIPTYLINE HCL 10 MG PO CAPS: 10.0000 mg | ORAL_CAPSULE | Freq: Every day | ORAL | 3 refills | Status: DC

## 2017-10-20 MED ORDER — RIZATRIPTAN BENZOATE 10 MG PO TBDP: 10.0000 mg | ORAL_TABLET | ORAL | 11 refills | Status: DC | PRN

## 2017-10-20 MED ORDER — ONDANSETRON HCL 4 MG PO TABS: ORAL_TABLET | ORAL | 6 refills | Status: DC

## 2017-10-20 MED ORDER — PROPRANOLOL HCL 10 MG PO TABS: ORAL_TABLET | ORAL | 6 refills | Status: DC

## 2017-10-20 NOTE — Progress Notes (Signed)
Botox- 100 units x 2 vials Lot: [id number] Expiration: 02/2020 NDC: 1761-6073-71  Bacteriostatic 0.9% Sodium Chloride- 56mL total Lot: [id number] Expiration: 02/10/2019 NDC: 8546-2703-50  Dx: K93.818 S/P  //BCrn

## 2017-10-20 NOTE — Patient Instructions (Addendum)
At onset of migraine or nausea may take the following as needed. These medications do not cause rebound headache  Ondansetron Reglan Propranolol  Limit the following:  NSAIDS: Ibuprofen, alleve, aspirin, cambia, diclofenac  Tylenol  Any medications on the list provided to you in the medication rebound document  Maxalt: Please take one tablet at the onset of your headache. If it does not improve the symptoms please take one additional tablet in 2 hours. Do not take more then 2 tablets in 24hrs. Do not take use more then 2 to 3 times in a week. Limit to 10 a month.  Propranolol oral solution What is this medicine? PROPRANOLOL (proe PRAN oh lole) is a beta-blocker. This medicine can be used to treat high blood pressure, to slow fast heart rate, and to relieve chest pain caused by angina. It is also used to treat infantile hemangioma, relieve uncontrollable shaking (tremors), and help certain problems related to the thyroid gland and adrenal gland. This medicine is also used to prevent migraine headaches. This medicine may be used for other purposes; ask your health care provider or pharmacist if you have questions. COMMON BRAND NAME(S): HEMANGEOL What should I tell my health care provider before I take this medicine? They need to know if you have any of these conditions: -circulation problems in fingers and toes -diabetes -poor appetite or feeding problems (infants) -heart disease -kidney disease -liver disease -low blood pressure -lung or breathing disease, like asthma -PHACE syndrome -pheochromocytoma -slow heart rate -thyroid disease -an unusual or allergic reaction to propranolol, other beta-blockers, medicines, foods, dyes, or preservatives -pregnant or trying to get pregnant -breast-feeding How should I use this medicine? Take this medicine by mouth. Follow the directions on the prescription label. Use a specially marked spoon or container to measure each dose. Ask your  pharmacist if you do not have one. Household spoons are not accurate. This medicine can be taken with or without food; small children should take this medicine with a feeding or right after a feeding. Take your medicine at regular intervals. Do not take it more often than directed. Do not stop taking except on your doctor's advice. Talk to your pediatrician regarding the use of this medicine in children. While this drug may be prescribed in children as young as 5 weeks for selected conditions, precautions do apply. Overdosage: If you think you have taken too much of this medicine contact a poison control center or emergency room at once. NOTE: This medicine is only for you. Do not share this medicine with others. What if I miss a dose? If you miss a dose, take it as soon as you can. If it is almost time for your next dose, take only that dose. Do not take double or extra doses. What may interact with this medicine? Do not take this medicine with any of the following medications: -feverfew -phenothiazines like chlorpromazine, mesoridazine, prochlorperazine, thioridazine This medicine may also interact with the following medications: -aluminum hydroxide gel -antipyrine -antiviral medicines for HIV or AIDS -barbiturates like phenobarbital -certain medicines for blood pressure, heart disease, irregular heart beat -cimetidine -ciprofloxacin -diazepam -fluconazole -haloperidol -isoniazid -medicines for cholesterol like cholestyramine or colestipol -medicines for depression, anxiety, or psychotic disturbances -medicines for migraine headache like almotriptan, eletriptan, frovatriptan, naratriptan, rizatriptan, sumatriptan, zolmitriptan -NSAIDs, medicines for pain and inflammation, like ibuprofen or naproxen -phenytoin -rifampin -steroid medicines like prednisone or cortisone -teniposide -theophylline -thyroid medicines -tolbutamide -warfarin -zileuton This list may not describe all  possible interactions. Give your health  care provider a list of all the medicines, herbs, non-prescription drugs, or dietary supplements you use. Also tell them if you smoke, drink alcohol, or use illegal drugs. Some items may interact with your medicine. What should I watch for while using this medicine? Visit your doctor or health care professional for regular checks on your progress. Check your blood pressure and pulse rate as directed. Ask your doctor or health care professional what your blood pressure and pulse rate should be, and when you should contact him or her. You may get drowsy or dizzy. Do not drive, use machinery, or do anything that needs mental alertness until you know how this drug affects you. Do not stand or sit up quickly, especially if you are an older patient. This reduces the risk of dizzy or fainting spells. Avoid alcoholic drinks; they can make you more dizzy. This medicine may affect blood sugar levels. If you have diabetes, check with your doctor or health care professional before you change your diet or the dose of your diabetic medicine. Learn the symptoms of low blood sugar. Do not give this medicine to small children if they are not feeding regularly or are vomiting. Do not treat yourself for coughs, colds, or pain while you are taking this medicine without asking your doctor or health care professional for advice. Some ingredients may increase your blood pressure. What side effects may I notice from receiving this medicine? Side effects that you should report to your doctor or health care professional as soon as possible: -allergic reactions like skin rash, itching or hives, swelling of the face, lips, or tongue -breathing problems -cold hands or feet -hallucinations -muscle cramps or weakness -signs and symptoms of low blood sugar such as feeling anxious; confusion; dizziness; increased hunger; unusually weak or tired; sweating; shakiness; cold; irritable; headache;  blurred vision; fast heartbeat; loss of consciousness -signs and symptoms of low blood pressure like dizziness; feeling faint or lightheaded, falls; unusually weak or tired -slow heart rate -swelling of the legs and ankles -trouble sleeping or nightmares -vomiting Side effects that usually do not require medical attention (report to your doctor or health care professional if they continue or are bothersome): -change in sex drive or performance -changes in sleep patterns (infants) -diarrhea -dry, sore eyes -hair loss -nausea -weak or tired This list may not describe all possible side effects. Call your doctor for medical advice about side effects. You may report side effects to FDA at 1-800-FDA-1088. Where should I keep my medicine? Keep out of the reach of children. Store at room temperature between 15 and 30 degrees C (59 and 86 degrees F). Do not freeze. Throw away any unused medicine after the expiration date. NOTE: This sheet is a summary. It may not cover all possible information. If you have questions about this medicine, talk to your doctor, pharmacist, or health care provider.  2018 Elsevier/Gold Standard (2013-05-26 10:09:00)   Metoclopramide tablets  We discussed can cause abnormal movements of the mouth or tongue but n other locations as well, this can be permanent, stop promptyl if this occurs. What is this medicine? METOCLOPRAMIDE (met oh kloe PRA mide) is used to treat the symptoms of gastroesophageal reflux disease (GERD) like heartburn. It is also used to treat people with slow emptying of the stomach and intestinal tract. This medicine may be used for other purposes; ask your health care provider or pharmacist if you have questions. COMMON BRAND NAME(S): Reglan What should I tell my health care provider before  I take this medicine? They need to know if you have any of these conditions: -breast cancer -depression -diabetes -heart failure -high blood  pressure -kidney disease -liver disease -Parkinson's disease or a movement disorder -pheochromocytoma -seizures -stomach obstruction, bleeding, or perforation -an unusual or allergic reaction to metoclopramide, procainamide, sulfites, other medicines, foods, dyes, or preservatives -pregnant or trying to get pregnant -breast-feeding How should I use this medicine? Take this medicine by mouth with a glass of water. Follow the directions on the prescription label. Take this medicine on an empty stomach, about 30 minutes before eating. Take your doses at regular intervals. Do not take your medicine more often than directed. Do not stop taking except on the advice of your doctor or health care professional. A special MedGuide will be given to you by the pharmacist with each prescription and refill. Be sure to read this information carefully each time. Talk to your pediatrician regarding the use of this medicine in children. Special care may be needed. Overdosage: If you think you have taken too much of this medicine contact a poison control center or emergency room at once. NOTE: This medicine is only for you. Do not share this medicine with others. What if I miss a dose? If you miss a dose, take it as soon as you can. If it is almost time for your next dose, take only that dose. Do not take double or extra doses. What may interact with this medicine? -acetaminophen -cyclosporine -digoxin -medicines for blood pressure -medicines for diabetes, including insulin -medicines for hay fever and other allergies -medicines for depression, especially a Monoamine Oxidase Inhibitor (MAOI) -medicines for Parkinson's disease, like levodopa -medicines for sleep or for pain -quinidine -tetracycline This list may not describe all possible interactions. Give your health care provider a list of all the medicines, herbs, non-prescription drugs, or dietary supplements you use. Also tell them if you smoke, drink  alcohol, or use illegal drugs. Some items may interact with your medicine. What should I watch for while using this medicine? It may take a few weeks for your stomach condition to start to get better. However, do not take this medicine for longer than 12 weeks. The longer you take this medicine, and the more you take it, the greater your chances are of developing serious side effects. If you are an elderly patient, a female patient, or you have diabetes, you may be at an increased risk for side effects from this medicine. Contact your doctor immediately if you start having movements you cannot control such as lip smacking, rapid movements of the tongue, involuntary or uncontrollable movements of the eyes, head, arms and legs, or muscle twitches and spasms. Patients and their families should watch out for worsening depression or thoughts of suicide. Also watch out for any sudden or severe changes in feelings such as feeling anxious, agitated, panicky, irritable, hostile, aggressive, impulsive, severely restless, overly excited and hyperactive, or not being able to sleep. If this happens, especially at the beginning of treatment or after a change in dose, call your doctor. Do not treat yourself for high fever. Ask your doctor or health care professional for advice. You may get drowsy or dizzy. Do not drive, use machinery, or do anything that needs mental alertness until you know how this drug affects you. Do not stand or sit up quickly, especially if you are an older patient. This reduces the risk of dizzy or fainting spells. Alcohol can make you more drowsy and dizzy. Avoid alcoholic  drinks. What side effects may I notice from receiving this medicine? Side effects that you should report to your doctor or health care professional as soon as possible: -allergic reactions like skin rash, itching or hives, swelling of the face, lips, or tongue -abnormal production of milk in females -breast enlargement in both  males and females -change in the way you walk -difficulty moving, speaking or swallowing -drooling, lip smacking, or rapid movements of the tongue -excessive sweating -fever -involuntary or uncontrollable movements of the eyes, head, arms and legs -irregular heartbeat or palpitations -muscle twitches and spasms -unusually weak or tired Side effects that usually do not require medical attention (report to your doctor or health care professional if they continue or are bothersome): -change in sex drive or performance -depressed mood -diarrhea -difficulty sleeping -headache -menstrual changes -restless or nervous This list may not describe all possible side effects. Call your doctor for medical advice about side effects. You may report side effects to FDA at 1-800-FDA-1088. Where should I keep my medicine? Keep out of the reach of children. Store at room temperature between 20 and 25 degrees C (68 and 77 degrees F). Protect from light. Keep container tightly closed. Throw away any unused medicine after the expiration date. NOTE: This sheet is a summary. It may not cover all possible information. If you have questions about this medicine, talk to your doctor, pharmacist, or health care provider.  2018 Elsevier/Gold Standard (2016-07-15 15:13:45)   Ondansetron tablets What is this medicine? ONDANSETRON (on DAN se tron) is used to treat nausea and vomiting caused by chemotherapy. It is also used to prevent or treat nausea and vomiting after surgery. This medicine may be used for other purposes; ask your health care provider or pharmacist if you have questions. COMMON BRAND NAME(S): Zofran What should I tell my health care provider before I take this medicine? They need to know if you have any of these conditions: -heart disease -history of irregular heartbeat -liver disease -low levels of magnesium or potassium in the blood -an unusual or allergic reaction to ondansetron, granisetron,  other medicines, foods, dyes, or preservatives -pregnant or trying to get pregnant -breast-feeding How should I use this medicine? Take this medicine by mouth with a glass of water. Follow the directions on your prescription label. Take your doses at regular intervals. Do not take your medicine more often than directed. Talk to your pediatrician regarding the use of this medicine in children. Special care may be needed. Overdosage: If you think you have taken too much of this medicine contact a poison control center or emergency room at once. NOTE: This medicine is only for you. Do not share this medicine with others. What if I miss a dose? If you miss a dose, take it as soon as you can. If it is almost time for your next dose, take only that dose. Do not take double or extra doses. What may interact with this medicine? Do not take this medicine with any of the following medications: -apomorphine -certain medicines for fungal infections like fluconazole, itraconazole, ketoconazole, posaconazole, voriconazole -cisapride -dofetilide -dronedarone -pimozide -thioridazine -ziprasidone This medicine may also interact with the following medications: -carbamazepine -certain medicines for depression, anxiety, or psychotic disturbances -fentanyl -linezolid -MAOIs like Carbex, Eldepryl, Marplan, Nardil, and Parnate -methylene blue (injected into a vein) -other medicines that prolong the QT interval (cause an abnormal heart rhythm) -phenytoin -rifampicin -tramadol This list may not describe all possible interactions. Give your health care provider a list of  all the medicines, herbs, non-prescription drugs, or dietary supplements you use. Also tell them if you smoke, drink alcohol, or use illegal drugs. Some items may interact with your medicine. What should I watch for while using this medicine? Check with your doctor or health care professional right away if you have any sign of an allergic  reaction. What side effects may I notice from receiving this medicine? Side effects that you should report to your doctor or health care professional as soon as possible: -allergic reactions like skin rash, itching or hives, swelling of the face, lips or tongue -breathing problems -confusion -dizziness -fast or irregular heartbeat -feeling faint or lightheaded, falls -fever and chills -loss of balance or coordination -seizures -sweating -swelling of the hands or feet -tightness in the chest -tremors -unusually weak or tired Side effects that usually do not require medical attention (report to your doctor or health care professional if they continue or are bothersome): -constipation or diarrhea -headache This list may not describe all possible side effects. Call your doctor for medical advice about side effects. You may report side effects to FDA at 1-800-FDA-1088. Where should I keep my medicine? Keep out of the reach of children. Store between 2 and 30 degrees C (36 and 86 degrees F). Throw away any unused medicine after the expiration date. NOTE: This sheet is a summary. It may not cover all possible information. If you have questions about this medicine, talk to your doctor, pharmacist, or health care provider.  2018 Elsevier/Gold Standard (2013-07-05 16:27:45)

## 2017-10-20 NOTE — Progress Notes (Signed)

## 2017-10-21 ENCOUNTER — Telehealth: Payer: Self-pay | Admitting: Neurology

## 2017-10-21 ENCOUNTER — Other Ambulatory Visit: Payer: Self-pay | Admitting: Family Medicine

## 2017-10-21 ENCOUNTER — Other Ambulatory Visit: Payer: Self-pay | Admitting: Neurology

## 2017-10-21 ENCOUNTER — Ambulatory Visit
Admission: RE | Admit: 2017-10-21 | Discharge: 2017-10-21 | Disposition: A | Discharge status: Home / Self Care | Payer: 59 | Source: Ambulatory Visit | Attending: Family Medicine | Admitting: Family Medicine

## 2017-10-21 DIAGNOSIS — R52 Pain, unspecified: Secondary | ICD-10-CM

## 2017-10-21 MED ORDER — RIZATRIPTAN BENZOATE 10 MG PO TBDP: 10.0000 mg | ORAL_TABLET | ORAL | 11 refills | Status: DC | PRN

## 2017-10-21 NOTE — Telephone Encounter (Signed)
Juliann Pulse with [facility] is requesting a phone call to discuss the pts new medication. Please call back at [phone]

## 2017-10-21 NOTE — Telephone Encounter (Addendum)
RN call Juliann Pulse at [facility] that Dr. Jaynee Eagles is fine with pt taking lexapro. Pt will be taking the meds zofran,reglan,inderal, nortriptyline that Dr. Jaynee Eagles prescribed with the lexapro. Rn stated it was already discuss with pt at yesterday office visit. [facility] pharmacist verbalized understanding.

## 2017-10-21 NOTE — Telephone Encounter (Signed)
Revised. 

## 2017-10-21 NOTE — Telephone Encounter (Signed)
Rn call [facility] back. The 10mg  lexapro was prescribed by the pts PCP, Dr Harlene Ramus MD.

## 2017-10-21 NOTE — Telephone Encounter (Signed)
Rn call Building control surveyor at [facility] [employer]. Juliann Pulse stated Dr.Ahern prescribed inderal, Zofran, Reglan, and nortriptyline yesterday. Juliann Pulse stated she wanted to inform Dr. Jaynee Eagles that these medications have a interaction with the pts lexapro.which is 10mg .  The interactions can cause some arrhythmia. Rn stated a message will be sent to Dr.Ahern.

## 2017-10-21 NOTE — Telephone Encounter (Signed)
I am ok with the use of these medications together, the risk is very low. Please let them know and I discussed with patient as well thanks

## 2017-10-29 NOTE — Telephone Encounter (Signed)
Pt called to inform us that nortriptyline (PAMELOR) 10 MG capsule has been working in means of less pain but has made pt restless and not able to focus. Pt is also waking up in a panic and is constantly thirsty and constipated. Pt is wanting a call back to know if she should take medication every other day or maybe lower dosage or what can help.

## 2017-10-29 NOTE — Telephone Encounter (Signed)
No just stop the medicine thanks

## 2017-10-29 NOTE — Telephone Encounter (Signed)
Called patient and LVM asking for return call. Included office number and that the office will be closing at noon today.

## 2017-10-29 NOTE — Telephone Encounter (Signed)
That a pretty low dose, if it is causing her symptoms I would stop it unless she can tolerate for a few weeks and see if symptoms improve. thanks

## 2017-10-29 NOTE — Telephone Encounter (Signed)
Spoke with patient. We discussed if side effects can be tolerated she can continue the medication for a few weeks and see if symptoms improve. She states they are not tolerable. I informed her that per Dr. Jaynee Eagles, stop the medication. She verbalized understanding. She also asked if there was any medical benefit to taking it every other day. I informed likely not but would check with Dr. Jaynee Eagles to be sure.

## 2017-11-01 NOTE — Telephone Encounter (Signed)
Spoke with patient. I advised patient that the answer to her question about taking it every other day is no. Dr. Jaynee Eagles says just to stop the medicine. She verbalized understanding and appreciation.

## 2017-12-28 ENCOUNTER — Telehealth: Payer: Self-pay | Admitting: Neurology

## 2017-12-28 NOTE — Telephone Encounter (Addendum)
Pt called aijovy needs PA. Pt said she using an RX card and medication is free for 12 mths. Juluis Rainier) Pharmacy: [facility]  Pt called back, she had the pharmacy run medication without ins card and it went thru so please disregard this msg

## 2017-12-29 ENCOUNTER — Encounter: Payer: Self-pay | Admitting: *Deleted

## 2017-12-29 NOTE — Telephone Encounter (Signed)
Appeal letter written and printed. Ready for MD review & signature.

## 2017-12-29 NOTE — Telephone Encounter (Signed)
Received denial notification for Ajovy due to the following reasons: It is being used in combination with Botox The patient's alternatives recommended are Emgality & Aimovig.

## 2017-12-29 NOTE — Telephone Encounter (Signed)
Called [facility] and regarding new PA request and spoke with [employer], representative. She stated that the Ajovy was approved through 12/19/2017 (case # [id number]). A new PA will need to be done. If it is denied, another appeal letter can be sent.

## 2017-12-29 NOTE — Telephone Encounter (Addendum)
Called pt and discussed Ajovy since she started it. According to the patient she is doing "great, wonderful" with the [place]. Her migraine & h/a days decrease every month while continuing the medication.   January- 15 headaches days total, 13 were migraine days  February- 12 headaches days total, 7 were migraine days   March- 7 headache days so far, 5 of those days have been migraine days.   Pt has been successfully getting the medication with the copay card. However, a new PA is due. RN will complete new PA and if necessary will send in another appeal.   Meds tried: Topiramate, nortriptyline, propranolol, lyrica, "a million preventatives", tizanidine, rizatriptan and other triptans she cannot remember, cambia, Zanaflex, Maxalt, trigger point injections in her cervical muscles, massage, Robaxin, Atenolol (she felt faint, low BP, hot/sweaty), Lexapro, Zofran, Reglan, Depakote.  PA done on Cover My Meds KEY: [id number]. Anticipate response from [facility] within 72 hours.

## 2017-12-30 ENCOUNTER — Encounter: Payer: Self-pay | Admitting: *Deleted

## 2018-01-12 NOTE — Telephone Encounter (Signed)
Received response from [employer] re: Holly Jenkins appeal letter  Holly Jenkins has been APPROVED until 01/01/2019.   Called the patient and informed her of the approval. She was very appreciative and stated that she would let her pharmacy know as she is due for her next injection on 4/15.

## 2018-01-24 ENCOUNTER — Ambulatory Visit (INDEPENDENT_AMBULATORY_CARE_PROVIDER_SITE_OTHER): Payer: 59 | Admitting: Neurology

## 2018-01-24 ENCOUNTER — Encounter: Payer: Self-pay | Admitting: Neurology

## 2018-01-24 VITALS — BP 97/65 | HR 64

## 2018-01-24 DIAGNOSIS — G43709 Chronic migraine without aura, not intractable, without status migrainosus: Secondary | ICD-10-CM | POA: Diagnosis not present

## 2018-01-24 NOTE — Progress Notes (Signed)
Consent Form Botulism Toxin Injection For Chronic Migraine   Interval update 01/24/2018: Migraines reduced to 2-3 migraine days a month >75% improvement in migraines. +trap, not levator, not eyes. +masseters, extra in the occipital areas.   Botulism toxin has been approved by the Federal drug administration for treatment of chronic migraine. Botulism toxin does not cure chronic migraine and it may not be effective in some patients.  The administration of botulism toxin is accomplished by injecting a small amount of toxin into the muscles of the neck and head. Dosage must be titrated for each individual. Any benefits resulting from botulism toxin tend to wear off after 3 months with a repeat injection required if benefit is to be maintained. Injections are usually done every 3-4 months with maximum effect peak achieved by about 2 or 3 weeks. Botulism toxin is expensive and you should be sure of what costs you will incur resulting from the injection.  The side effects of botulism toxin use for chronic migraine may include:   -Transient, and usually mild, facial weakness with facial injections  -Transient, and usually mild, head or neck weakness with head/neck injections  -Reduction or loss of forehead facial animation due to forehead muscle weakness  -Eyelid drooping  -Dry eye  -Pain at the site of injection or bruising at the site of injection  -Double vision  -Potential unknown long term risks  Contraindications: You should not have Botox if you are pregnant, nursing, allergic to albumin, have an infection, skin condition, or muscle weakness at the site of the injection, or have myasthenia gravis, Lambert-Eaton syndrome, or ALS.  It is also possible that as with any injection, there may be an allergic reaction or no effect from the medication. Reduced effectiveness after repeated injections is sometimes seen and rarely infection at the injection site may occur. All care will be taken to  prevent these side effects. If therapy is given over a long time, atrophy and wasting in the muscle injected may occur. Occasionally the patient's become refractory to treatment because they develop antibodies to the toxin. In this event, therapy needs to be modified.  I have read the above information and consent to the administration of botulism toxin.    ______________  _____   _________________  Patient signature     Date   Witness signature       BOTOX PROCEDURE NOTE FOR MIGRAINE HEADACHE    Contraindications and precautions discussed with patient(above). Aseptic procedure was observed and patient tolerated procedure. Procedure performed by Dr. Georgia Dom  The condition has existed for more than 6 months, and pt does not have a diagnosis of ALS, Myasthenia Gravis or Lambert-Eaton Syndrome. Risks and benefits of injections discussed and pt agrees to proceed with the procedure. Written consent obtained  These injections are medically necessary. He receives good benefits from these injections. These injections do not cause sedations or hallucinations which the oral therapies may cause.  Indication/Diagnosis: chronic migraine BOTOX(J0585) injection was performed according to protocol by [employer]. 200 units of BOTOX was dissolved into 4 cc NS.  NDC: [address]-09  Description of procedure:  The patient was placed in a sitting position. The standard protocol was used for Botox as follows, with 5 units of Botox injected at each site:   -Procerus muscle, midline injection  -Corrugator muscle, bilateral injection  -Frontalis muscle, bilateral injection, with 2 sites each side, medial injection was performed in the upper one third of the frontalis muscle, in the region vertical from  the medial inferior edge of the superior orbital rim. The lateral injection was again in the upper one third of the forehead vertically above the lateral limbus of the cornea, 1.5 cm lateral to the  medial injection site.  -Temporalis muscle injection, 4 sites, bilaterally. The first injection was 3 cm above the tragus of the ear, second injection site was 1.5 cm to 3 cm up from the first injection site in line with the tragus of the ear. The third injection site was 1.5-3 cm forward between the first 2 injection sites. The fourth injection site was 1.5 cm posterior to the second injection site.  -Occipitalis muscle injection, 3 sites, bilaterally. The first injection was done one half way between the occipital protuberance and the tip of the mastoid process behind the ear. The second injection site was done lateral and superior to the first, 1 fingerbreadth from the first injection. The third injection site was 1 fingerbreadth superiorly and medially from the first injection site.  -Cervical paraspinal muscle injection, 2 sites, bilateral knee first injection site was 1 cm from the midline of the cervical spine, 3 cm inferior to the lower border of the occipital protuberance. The second injection site was 1.5 cm superiorly and laterally to the first injection site.  -Trapezius muscle injection was performed at 3 sites, bilaterally. The first injection site was in the upper trapezius muscle halfway between the inflection point of the neck, and the acromion. The second injection site was one half way between the acromion and the first injection site. The third injection was done between the first injection site and the inflection point of the neck.   Will return for repeat injection in 3 months.   A 200 unit sof Botox was used, 155 units were injected, the rest of the Botox was wasted. The patient tolerated the procedure well, there were no complications of the above procedure.

## 2018-01-24 NOTE — Progress Notes (Signed)
Botox- 100 units x 2 vials Lot: [id number] Expiration: 02/2020 LOT: [id number] EXP: 01/2020 NDC: 5364-6803-21  Bacteriostatic 0.9% Sodium Chloride- 39mL total Lot: [id number] Expiration: 02/10/2019 NDC: 0037-0488-89  Dx: V69.450 S/P  //BCrn

## 2018-04-04 ENCOUNTER — Encounter: Payer: Self-pay | Admitting: Women's Health

## 2018-04-04 ENCOUNTER — Ambulatory Visit (INDEPENDENT_AMBULATORY_CARE_PROVIDER_SITE_OTHER): Payer: 59 | Admitting: Women's Health

## 2018-04-04 VITALS — BP 110/78 | Ht 68.0 in | Wt 162.0 lb

## 2018-04-04 DIAGNOSIS — Z01419 Encounter for gynecological examination (general) (routine) without abnormal findings: Secondary | ICD-10-CM | POA: Diagnosis not present

## 2018-04-04 MED ORDER — ETHYNODIOL DIAC-ETH ESTRADIOL 1-35 MG-MCG PO TABS: ORAL_TABLET | ORAL | 4 refills | Status: DC

## 2018-04-04 NOTE — Progress Notes (Signed)
Rielyn Krupinski 03-29-1976 [id number]    History:    Presents for new patient annual exam.  Normal Pap history, amenorrheic on OCs continuously for migraine prevention.  Had numerous labs done at [facility], currently on a mold treatment medication that initially started some breakthrough bleeding on her OCs.  Desires no children.  Works in the lab.  Parents healthy, brother mental health-addiction problem.  Past medical history, past surgical history, family history and social history were all reviewed and documented in the EPIC chart.  ROS:  A ROS was performed and pertinent positives and negatives are included.  Exam:  Vitals:   04/04/18 1203  BP: 110/78  Weight: 162 lb (73.5 kg)  Height: 5\' 8"  (1.727 m)   Body mass index is 24.63 kg/m.   General appearance:  Normal Thyroid:  Symmetrical, normal in size, without palpable masses or nodularity. Respiratory  Auscultation:  Clear without wheezing or rhonchi Cardiovascular  Auscultation:  Regular rate, without rubs, murmurs or gallops  Edema/varicosities:  Not grossly evident Abdominal  Soft,nontender, without masses, guarding or rebound.  Liver/spleen:  No organomegaly noted  Hernia:  None appreciated  Skin  Inspection:  Grossly normal   Breasts: Examined lying and sitting.     Right: Without masses, retractions, discharge or axillary adenopathy.     Left: Without masses, retractions, discharge or axillary adenopathy. Gentitourinary   Inguinal/mons:  Normal without inguinal adenopathy  External genitalia:  Normal  BUS/Urethra/Skene's glands:  Normal  Vagina:  Normal  Cervix:  Normal  Uterus:  normal in size, shape and contour.  Midline and mobile  Adnexa/parametria:     Rt: Without masses or tenderness.   Lt: Without masses or tenderness.  Anus and perineum: Normal  Digital rectal exam: Normal sphincter tone without palpated masses or tenderness  Assessment/Plan:  42 y.o. WF G0 for annual exam  with no GYN complaints.  Amenorrheic on OCs continuously/breakthrough bleeding x1 Migraines without aura, fibromyalgia, mold exposure,- [facility] labs and meds  Plan: Reviewed possible mold medication decreased efficacy of OCs causing the breakthrough bleeding, reviewed if bleeding occurs again possible decreased effectiveness of OCs, back-up contraception reviewed.  Zovia prescription, proper use given and reviewed slight risk for blood clots and strokes.,  SBE's, annual screening mammogram, breast center information given instructed to schedule.  Encouraged regular cardio type exercise.  Pap with HR HPV typing, new screening guidelines reviewed.    Huel Cote [facility], 2:05 PM 04/04/2018

## 2018-04-04 NOTE — Addendum Note (Signed)
Addended by: Lorine Bears on: 04/04/2018 02:13 PM   Modules accepted: Orders

## 2018-04-04 NOTE — Patient Instructions (Signed)
Mammogram [phone]  Breast center  Health Maintenance, Female Adopting a healthy lifestyle and getting preventive care can go a long way to promote health and wellness. Talk with your health care provider about what schedule of regular examinations is right for you. This is a good chance for you to check in with your provider about disease prevention and staying healthy. In between checkups, there are plenty of things you can do on your own. Experts have done a lot of research about which lifestyle changes and preventive measures are most likely to keep you healthy. Ask your health care provider for more information. Weight and diet Eat a healthy diet  Be sure to include plenty of vegetables, fruits, low-fat dairy products, and lean protein.  Do not eat a lot of foods high in solid fats, added sugars, or salt.  Get regular exercise. This is one of the most important things you can do for your health. ? Most adults should exercise for at least 150 minutes each week. The exercise should increase your heart rate and make you sweat (moderate-intensity exercise). ? Most adults should also do strengthening exercises at least twice a week. This is in addition to the moderate-intensity exercise.  Maintain a healthy weight  Body mass index (BMI) is a measurement that can be used to identify possible weight problems. It estimates body fat based on height and weight. Your health care provider can help determine your BMI and help you achieve or maintain a healthy weight.  For females 20 years of age and older: ? A BMI below 18.5 is considered underweight. ? A BMI of 18.5 to 24.9 is normal. ? A BMI of 25 to 29.9 is considered overweight. ? A BMI of 30 and above is considered obese.  Watch levels of cholesterol and blood lipids  You should start having your blood tested for lipids and cholesterol at 43 years of age, then have this test every 5 years.  You may need to have your cholesterol levels  checked more often if: ? Your lipid or cholesterol levels are high. ? You are older than 43 years of age. ? You are at high risk for heart disease.  Cancer screening Lung Cancer  Lung cancer screening is recommended for adults 55-80 years old who are at high risk for lung cancer because of a history of smoking.  A yearly low-dose CT scan of the lungs is recommended for people who: ? Currently smoke. ? Have quit within the past 15 years. ? Have at least a 30-pack-year history of smoking. A pack year is smoking an average of one pack of cigarettes a day for 1 year.  Yearly screening should continue until it has been 15 years since you quit.  Yearly screening should stop if you develop a health problem that would prevent you from having lung cancer treatment.  Breast Cancer  Practice breast self-awareness. This means understanding how your breasts normally appear and feel.  It also means doing regular breast self-exams. Let your health care provider know about any changes, no matter how small.  If you are in your 20s or 30s, you should have a clinical breast exam (CBE) by a health care provider every 1-3 years as part of a regular health exam.  If you are 40 or older, have a CBE every year. Also consider having a breast X-ray (mammogram) every year.  If you have a family history of breast cancer, talk to your health care provider about genetic screening.    If you are at high risk for breast cancer, talk to your health care provider about having an MRI and a mammogram every year.  Breast cancer gene (BRCA) assessment is recommended for women who have family members with BRCA-related cancers. BRCA-related cancers include: ? Breast. ? Ovarian. ? Tubal. ? Peritoneal cancers.  Results of the assessment will determine the need for genetic counseling and BRCA1 and BRCA2 testing.  Cervical Cancer Your health care provider may recommend that you be screened regularly for cancer of the  pelvic organs (ovaries, uterus, and vagina). This screening involves a pelvic examination, including checking for microscopic changes to the surface of your cervix (Pap test). You may be encouraged to have this screening done every 3 years, beginning at age 21.  For women ages 30-65, health care providers may recommend pelvic exams and Pap testing every 3 years, or they may recommend the Pap and pelvic exam, combined with testing for human papilloma virus (HPV), every 5 years. Some types of HPV increase your risk of cervical cancer. Testing for HPV may also be done on women of any age with unclear Pap test results.  Other health care providers may not recommend any screening for nonpregnant women who are considered low risk for pelvic cancer and who do not have symptoms. Ask your health care provider if a screening pelvic exam is right for you.  If you have had past treatment for cervical cancer or a condition that could lead to cancer, you need Pap tests and screening for cancer for at least 20 years after your treatment. If Pap tests have been discontinued, your risk factors (such as having a new sexual partner) need to be reassessed to determine if screening should resume. Some women have medical problems that increase the chance of getting cervical cancer. In these cases, your health care provider may recommend more frequent screening and Pap tests.  Colorectal Cancer  This type of cancer can be detected and often prevented.  Routine colorectal cancer screening usually begins at 43 years of age and continues through 43 years of age.  Your health care provider may recommend screening at an earlier age if you have risk factors for colon cancer.  Your health care provider may also recommend using home test kits to check for hidden blood in the stool.  A small camera at the end of a tube can be used to examine your colon directly (sigmoidoscopy or colonoscopy). This is done to check for the  earliest forms of colorectal cancer.  Routine screening usually begins at age 50.  Direct examination of the colon should be repeated every 5-10 years through 43 years of age. However, you may need to be screened more often if early forms of precancerous polyps or small growths are found.  Skin Cancer  Check your skin from head to toe regularly.  Tell your health care provider about any new moles or changes in moles, especially if there is a change in a mole's shape or color.  Also tell your health care provider if you have a mole that is larger than the size of a pencil eraser.  Always use sunscreen. Apply sunscreen liberally and repeatedly throughout the day.  Protect yourself by wearing long sleeves, pants, a wide-brimmed hat, and sunglasses whenever you are outside.  Heart disease, diabetes, and high blood pressure  High blood pressure causes heart disease and increases the risk of stroke. High blood pressure is more likely to develop in: ? People who have blood   who have blood pressure in the high end of the normal range (130-139/85-89 mm Hg). ? People who are overweight or obese. ? People who are African American.  If you are 5-22 years of age, have your blood pressure checked every 3-5 years. If you are 69 years of age or older, have your blood pressure checked every year. You should have your blood pressure measured twice-once when you are at a hospital or clinic, and once when you are not at a hospital or clinic. Record the average of the two measurements. To check your blood pressure when you are not at a hospital or clinic, you can use: ? An automated blood pressure machine at a pharmacy. ? A home blood pressure monitor.  If you are between 29 years and 27 years old, ask your health care provider if you should take aspirin to prevent strokes.  Have regular diabetes screenings. This involves taking a blood sample to check your fasting blood sugar level. ? If you are at a normal weight and  have a low risk for diabetes, have this test once every three years after 43 years of age. ? If you are overweight and have a high risk for diabetes, consider being tested at a younger age or more often. Preventing infection Hepatitis B  If you have a higher risk for hepatitis B, you should be screened for this virus. You are considered at high risk for hepatitis B if: ? You were born in a country where hepatitis B is common. Ask your health care provider which countries are considered high risk. ? Your parents were born in a high-risk country, and you have not been immunized against hepatitis B (hepatitis B vaccine). ? You have HIV or AIDS. ? You use needles to inject street drugs. ? You live with someone who has hepatitis B. ? You have had sex with someone who has hepatitis B. ? You get hemodialysis treatment. ? You take certain medicines for conditions, including cancer, organ transplantation, and autoimmune conditions.  Hepatitis C  Blood testing is recommended for: ? Everyone born from [birth year] through 1966. ? Anyone with known risk factors for hepatitis C.  Sexually transmitted infections (STIs)  You should be screened for sexually transmitted infections (STIs) including gonorrhea and chlamydia if: ? You are sexually active and are younger than 43 years of age. ? You are older than 43 years of age and your health care provider tells you that you are at risk for this type of infection. ? Your sexual activity has changed since you were last screened and you are at an increased risk for chlamydia or gonorrhea. Ask your health care provider if you are at risk.  If you do not have HIV, but are at risk, it may be recommended that you take a prescription medicine daily to prevent HIV infection. This is called pre-exposure prophylaxis (PrEP). You are considered at risk if: ? You are sexually active and do not regularly use condoms or know the HIV status of your partner(s). ? You take drugs by  injection. ? You are sexually active with a partner who has HIV.  Talk with your health care provider about whether you are at high risk of being infected with HIV. If you choose to begin PrEP, you should first be tested for HIV. You should then be tested every 3 months for as long as you are taking PrEP. Pregnancy  If you are premenopausal and you may become pregnant, ask your health care  provider about preconception counseling.  If you may become pregnant, take 400 to 800 micrograms (mcg) of folic acid every day.  If you want to prevent pregnancy, talk to your health care provider about birth control (contraception). Osteoporosis and menopause  Osteoporosis is a disease in which the bones lose minerals and strength with aging. This can result in serious bone fractures. Your risk for osteoporosis can be identified using a bone density scan.  If you are 35 years of age or older, or if you are at risk for osteoporosis and fractures, ask your health care provider if you should be screened.  Ask your health care provider whether you should take a calcium or vitamin D supplement to lower your risk for osteoporosis.  Menopause may have certain physical symptoms and risks.  Hormone replacement therapy may reduce some of these symptoms and risks. Talk to your health care provider about whether hormone replacement therapy is right for you. Follow these instructions at home:  Schedule regular health, dental, and eye exams.  Stay current with your immunizations.  Do not use any tobacco products including cigarettes, chewing tobacco, or electronic cigarettes.  If you are pregnant, do not drink alcohol.  If you are breastfeeding, limit how much and how often you drink alcohol.  Limit alcohol intake to no more than 1 drink per day for nonpregnant women. One drink equals 12 ounces of beer, 5 ounces of wine, or 1 ounces of hard liquor.  Do not use street drugs.  Do not share needles.  Ask  your health care provider for help if you need support or information about quitting drugs.  Tell your health care provider if you often feel depressed.  Tell your health care provider if you have ever been abused or do not feel safe at home. This information is not intended to replace advice given to you by your health care provider. Make sure you discuss any questions you have with your health care provider. Document Released: 04/13/2011 Document Revised: 03/05/2016 Document Reviewed: 07/02/2015 Elsevier Interactive Patient Education  [employer].

## 2018-05-03 ENCOUNTER — Ambulatory Visit (INDEPENDENT_AMBULATORY_CARE_PROVIDER_SITE_OTHER): Payer: 59 | Admitting: Neurology

## 2018-05-03 DIAGNOSIS — G43709 Chronic migraine without aura, not intractable, without status migrainosus: Secondary | ICD-10-CM

## 2018-05-03 NOTE — Progress Notes (Signed)
Botox- 100 units x 2 vials Lot: [id number] Expiration: 06/2020 NDC: 3014-1597-33  Bacteriostatic 0.9% Sodium Chloride- 34mL total Lot: [id number] Expiration: 02/10/2019 NDC: 1994-1290-47  Dx: [id number] S/P

## 2018-05-03 NOTE — Progress Notes (Signed)
Consent Form Botulism Toxin Injection For Chronic Migraine   Interval update 01/24/2018: Migraines reduced to 2-3 migraine days a month >75% improvement in migraines. Has only used rescue medication 4 times since last botox. Excellent response. +trap, +levator, not eyes. +masseters, extra in the occipital areas. +temporalis   Botulism toxin has been approved by the Federal drug administration for treatment of chronic migraine. Botulism toxin does not cure chronic migraine and it may not be effective in some patients.  The administration of botulism toxin is accomplished by injecting a small amount of toxin into the muscles of the neck and head. Dosage must be titrated for each individual. Any benefits resulting from botulism toxin tend to wear off after 3 months with a repeat injection required if benefit is to be maintained. Injections are usually done every 3-4 months with maximum effect peak achieved by about 2 or 3 weeks. Botulism toxin is expensive and you should be sure of what costs you will incur resulting from the injection.  The side effects of botulism toxin use for chronic migraine may include:   -Transient, and usually mild, facial weakness with facial injections  -Transient, and usually mild, head or neck weakness with head/neck injections  -Reduction or loss of forehead facial animation due to forehead muscle weakness  -Eyelid drooping  -Dry eye  -Pain at the site of injection or bruising at the site of injection  -Double vision  -Potential unknown long term risks  Contraindications: You should not have Botox if you are pregnant, nursing, allergic to albumin, have an infection, skin condition, or muscle weakness at the site of the injection, or have myasthenia gravis, Lambert-Eaton syndrome, or ALS.  It is also possible that as with any injection, there may be an allergic reaction or no effect from the medication. Reduced effectiveness after repeated injections is  sometimes seen and rarely infection at the injection site may occur. All care will be taken to prevent these side effects. If therapy is given over a long time, atrophy and wasting in the muscle injected may occur. Occasionally the patient's become refractory to treatment because they develop antibodies to the toxin. In this event, therapy needs to be modified.  I have read the above information and consent to the administration of botulism toxin.    ______________  _____   _________________  Patient signature     Date   Witness signature       BOTOX PROCEDURE NOTE FOR MIGRAINE HEADACHE    Contraindications and precautions discussed with patient(above). Aseptic procedure was observed and patient tolerated procedure. Procedure performed by Dr. Georgia Dom  The condition has existed for more than 6 months, and pt does not have a diagnosis of ALS, Myasthenia Gravis or Lambert-Eaton Syndrome. Risks and benefits of injections discussed and pt agrees to proceed with the procedure. Written consent obtained  These injections are medically necessary. He receives good benefits from these injections. These injections do not cause sedations or hallucinations which the oral therapies may cause.  Indication/Diagnosis: chronic migraine BOTOX(J0585) injection was performed according to protocol by [employer]. 200 units of BOTOX was dissolved into 4 cc NS.  NDC: [address]-74  Description of procedure:  The patient was placed in a sitting position. The standard protocol was used for Botox as follows, with 5 units of Botox injected at each site:   -Procerus muscle, midline injection  -Corrugator muscle, bilateral injection  -Frontalis muscle, bilateral injection, with 2 sites each side, medial injection was performed  in the upper one third of the frontalis muscle, in the region vertical from the medial inferior edge of the superior orbital rim. The lateral injection was again in the upper one third  of the forehead vertically above the lateral limbus of the cornea, 1.5 cm lateral to the medial injection site.  -Temporalis muscle injection, 4 sites, bilaterally. The first injection was 3 cm above the tragus of the ear, second injection site was 1.5 cm to 3 cm up from the first injection site in line with the tragus of the ear. The third injection site was 1.5-3 cm forward between the first 2 injection sites. The fourth injection site was 1.5 cm posterior to the second injection site.  -Occipitalis muscle injection, 3 sites, bilaterally. The first injection was done one half way between the occipital protuberance and the tip of the mastoid process behind the ear. The second injection site was done lateral and superior to the first, 1 fingerbreadth from the first injection. The third injection site was 1 fingerbreadth superiorly and medially from the first injection site.  -Cervical paraspinal muscle injection, 2 sites, bilateral knee first injection site was 1 cm from the midline of the cervical spine, 3 cm inferior to the lower border of the occipital protuberance. The second injection site was 1.5 cm superiorly and laterally to the first injection site.  -Trapezius muscle injection was performed at 3 sites, bilaterally. The first injection site was in the upper trapezius muscle halfway between the inflection point of the neck, and the acromion. The second injection site was one half way between the acromion and the first injection site. The third injection was done between the first injection site and the inflection point of the neck.   Will return for repeat injection in 3 months.   A 200 unit sof Botox was used, 155 units were injected, the rest of the Botox was wasted. The patient tolerated the procedure well, there were no complications of the above procedure.

## 2018-05-18 ENCOUNTER — Telehealth: Payer: Self-pay | Admitting: Neurology

## 2018-05-18 NOTE — Telephone Encounter (Signed)
[facility] calling to schedule delivery for botox appt contact at [phone]

## 2018-05-23 NOTE — Telephone Encounter (Signed)
Patient does not have an apt for a few months. Will call and schedule delivery at a later time.

## 2018-06-15 LAB — PAP, TP IMAGING W/ HPV RNA, RFLX HPV TYPE 16,18/45: HPV DNA High Risk: NOT DETECTED

## 2018-07-26 NOTE — Telephone Encounter (Signed)
I called [facility] [phone] and requested a refill on the patients botox.

## 2018-07-28 NOTE — Telephone Encounter (Signed)
Pending patient consent. DW

## 2018-08-02 NOTE — Telephone Encounter (Signed)
I called and scheduled delivery. DW

## 2018-08-04 ENCOUNTER — Encounter: Payer: Self-pay | Admitting: Neurology

## 2018-08-04 ENCOUNTER — Ambulatory Visit (INDEPENDENT_AMBULATORY_CARE_PROVIDER_SITE_OTHER): Payer: 59 | Admitting: Neurology

## 2018-08-04 VITALS — BP 105/79 | HR 77 | Ht 68.0 in | Wt 157.6 lb

## 2018-08-04 DIAGNOSIS — G43709 Chronic migraine without aura, not intractable, without status migrainosus: Secondary | ICD-10-CM

## 2018-08-04 NOTE — Progress Notes (Signed)
**  Botox 100 units x 2 vials, NDC 3846-6599-35, Lot [id number], Exp 01/2021, specialty pharmacy.//mck,rn**

## 2018-08-04 NOTE — Progress Notes (Signed)
Consent Form Botulism Toxin Injection For Chronic Migraine   Interval update 08/04/2018: Migraines reduced to 2-3 migraine days a month >75% improvement in migraines. Has only used rescue medication 2 times since last botox. Excellent response.  +levator,  extra in the occipital areas bilaterally and also paraspinals bilaterally.    Consent Form Botulism Toxin Injection For Chronic Migraine    Reviewed orally with patient, additionally signature is on file:  Botulism toxin has been approved by the Federal drug administration for treatment of chronic migraine. Botulism toxin does not cure chronic migraine and it may not be effective in some patients.  The administration of botulism toxin is accomplished by injecting a small amount of toxin into the muscles of the neck and head. Dosage must be titrated for each individual. Any benefits resulting from botulism toxin tend to wear off after 3 months with a repeat injection required if benefit is to be maintained. Injections are usually done every 3-4 months with maximum effect peak achieved by about 2 or 3 weeks. Botulism toxin is expensive and you should be sure of what costs you will incur resulting from the injection.  The side effects of botulism toxin use for chronic migraine may include:   -Transient, and usually mild, facial weakness with facial injections  -Transient, and usually mild, head or neck weakness with head/neck injections  -Reduction or loss of forehead facial animation due to forehead muscle weakness  -Eyelid drooping  -Dry eye  -Pain at the site of injection or bruising at the site of injection  -Double vision  -Potential unknown long term risks  Contraindications: You should not have Botox if you are pregnant, nursing, allergic to albumin, have an infection, skin condition, or muscle weakness at the site of the injection, or have myasthenia gravis, Lambert-Eaton syndrome, or ALS.  It is also possible that as  with any injection, there may be an allergic reaction or no effect from the medication. Reduced effectiveness after repeated injections is sometimes seen and rarely infection at the injection site may occur. All care will be taken to prevent these side effects. If therapy is given over a long time, atrophy and wasting in the muscle injected may occur. Occasionally the patient's become refractory to treatment because they develop antibodies to the toxin. In this event, therapy needs to be modified.  I have read the above information and consent to the administration of botulism toxin.    BOTOX PROCEDURE NOTE FOR MIGRAINE HEADACHE    Contraindications and precautions discussed with patient(above). Aseptic procedure was observed and patient tolerated procedure. Procedure performed by Dr. Georgia Dom  The condition has existed for more than 6 months, and pt does not have a diagnosis of ALS, Myasthenia Gravis or Lambert-Eaton Syndrome.  Risks and benefits of injections discussed and pt agrees to proceed with the procedure.  Written consent obtained  These injections are medically necessary. Pt  receives good benefits from these injections. These injections do not cause sedations or hallucinations which the oral therapies may cause.  Description of procedure:  The patient was placed in a sitting position. The standard protocol was used for Botox as follows, with 5 units of Botox injected at each site:   -Procerus muscle, midline injection  -Corrugator muscle, bilateral injection  -Frontalis muscle, bilateral injection, with 2 sites each side, medial injection was performed in the upper one third of the frontalis muscle, in the region vertical from the medial inferior edge of the superior orbital rim.  The lateral injection was again in the upper one third of the forehead vertically above the lateral limbus of the cornea, 1.5 cm lateral to the medial injection site.  - Levator Scapulae: 5 units  bilaterally  -Temporalis muscle injection, 5 sites, bilaterally. The first injection was 3 cm above the tragus of the ear, second injection site was 1.5 cm to 3 cm up from the first injection site in line with the tragus of the ear. The third injection site was 1.5-3 cm forward between the first 2 injection sites. The fourth injection site was 1.5 cm posterior to the second injection site. 5th site laterally in the temporalis  muscleat the level of the outer canthus.  -Occipitalis muscle injection, 3, bilaterally. The first injection was done one half way between the occipital protuberance and the tip of the mastoid process behind the ear. The second injection site was done lateral and superior to the first, 1 fingerbreadth from the first injection. The third injection site was 1 fingerbreadth superiorly and medially from the first injection site. Additional units at each site instead of 5 units.  -Cervical paraspinal muscle injection, 2 sites, bilateral knee first injection site was 1 cm from the midline of the cervical spine, 3 cm inferior to the lower border of the occipital protuberance. The second injection site was 1.5 cm superiorly and laterally to the first injection site. Additional units used here instead of 5 at each site  -Trapezius muscle injection was performed at 3 sites, bilaterally. The first injection site was in the upper trapezius muscle halfway between the inflection point of the neck, and the acromion. The second injection site was one half way between the acromion and the first injection site. The third injection was done between the first injection site and the inflection point of the neck.   Will return for repeat injection in 3 months.   A 200 unit sof Botox was used, any Botox not injected was wasted. The patient tolerated the procedure well, there were no complications of the above procedure.

## 2018-08-30 ENCOUNTER — Encounter: Payer: Self-pay | Admitting: Women's Health

## 2018-08-30 ENCOUNTER — Ambulatory Visit (INDEPENDENT_AMBULATORY_CARE_PROVIDER_SITE_OTHER): Payer: 59 | Admitting: Women's Health

## 2018-08-30 VITALS — BP 110/78

## 2018-08-30 DIAGNOSIS — F5231 Female orgasmic disorder: Secondary | ICD-10-CM | POA: Diagnosis not present

## 2018-08-30 DIAGNOSIS — F419 Anxiety disorder, unspecified: Secondary | ICD-10-CM | POA: Diagnosis not present

## 2018-08-30 DIAGNOSIS — IMO0002 Reserved for concepts with insufficient information to code with codable children: Secondary | ICD-10-CM

## 2018-08-30 NOTE — Patient Instructions (Signed)

## 2018-08-30 NOTE — Progress Notes (Signed)
42 year old MWF G0 presents to discuss lack of orgasm.  States does have libido and enjoys  intercourse 2-3 times/week with husband.  States has never had an orgasm.  Husband is her first partner.  On OCs continuously for migraine prevention which has worked, amenorrhea.  History of migraines without aura.  Has struggled with anxiety since childhood, has been on antidepressants for greater than 20 years. Lexapro 20 mg daily, had been on 10 mg until 1 month ago primary care had increased it. Questions if she needs to continue on Lexapro or change or stop medication may help with orgasm.  Desires no children.  Currently being treated for mold and is feeling better from that.  States doing well from an anxiety, migraine and general feeling standpoint at this time.  Numerous family members with anxiety on mother side of the family.  Exam: Appears well, happy.  Affect appropriate, good eye contact, dress appropriate,  Anxiety Orgasm dysfunction  Plan: Long discussion regarding caution changing medications at this time since doing so well from a anxiety and general well-being feeling at this time.  Reviewed decreasing the Lexapro back to the 10 mg, discussed other medication such as Cymbalta which may have less effect on orgasm.  Reviewed since she has never had an orgasm may not be medication related.  Reassurance given regarding healthy libido with frequent intercourse that is enjoyable.  Briefly reviewed sex counseling she does see a therapist and will review with therapist.

## 2018-09-05 ENCOUNTER — Other Ambulatory Visit: Payer: Self-pay | Admitting: Neurology

## 2018-09-19 ENCOUNTER — Other Ambulatory Visit: Payer: Self-pay | Admitting: Neurology

## 2018-10-06 ENCOUNTER — Other Ambulatory Visit: Payer: Self-pay | Admitting: Neurology

## 2018-10-06 MED ORDER — DICLOFENAC POTASSIUM(MIGRAINE) 50 MG PO PACK: PACK | ORAL | 11 refills | Status: DC

## 2018-10-12 DIAGNOSIS — C50919 Malignant neoplasm of unspecified site of unspecified female breast: Secondary | ICD-10-CM

## 2018-10-12 HISTORY — DX: Malignant neoplasm of unspecified site of unspecified female breast: C50.919

## 2018-10-13 ENCOUNTER — Encounter: Payer: Self-pay | Admitting: *Deleted

## 2018-10-24 ENCOUNTER — Other Ambulatory Visit: Payer: Self-pay | Admitting: Neurology

## 2018-10-31 ENCOUNTER — Telehealth: Payer: Self-pay | Admitting: Neurology

## 2018-10-31 NOTE — Telephone Encounter (Signed)
I called to schedule delivery. They tried to reach the patient but did not get her. I will call back later today.

## 2018-10-31 NOTE — Telephone Encounter (Signed)
I talked to the patient about giving consent. She stated that she has new insurance. [employer] [id number].

## 2018-11-02 ENCOUNTER — Ambulatory Visit: Payer: Self-pay | Admitting: Neurology

## 2018-11-03 ENCOUNTER — Telehealth: Payer: Self-pay | Admitting: *Deleted

## 2018-11-03 NOTE — Telephone Encounter (Signed)
I spoke with a representative from [facility] who was asking for additional clinicals. I sent the clinicals over to them. DW

## 2018-11-03 NOTE — Telephone Encounter (Signed)
PA for Ajovy 225mg /1.3ml (continuation of therapy) completed via Cover My Meds. Key# [id number]

## 2018-11-07 NOTE — Telephone Encounter (Signed)
Patient called and I spoke with her letting her know it was still pending with [employer] and I had checked status this morning. It should be finalized by today.

## 2018-11-09 NOTE — Telephone Encounter (Signed)
I called to check status it is still pending. DW

## 2018-11-10 NOTE — Telephone Encounter (Signed)
I called the patient to make her aware to keep her apt tomorrow. We have a 100 unit vial of medicine left over from her last visit. The patient did not answer so I left a VM asking her to call me back and informing her to still come in tomorrow.

## 2018-11-10 NOTE — Telephone Encounter (Signed)
Dr. Jaynee Eagles is aware and will be using a 100 unit sample to complete pt's Botox tomorrow. She will also discuss with pt plan of care I.e. CGRP vs Botox.

## 2018-11-10 NOTE — Telephone Encounter (Signed)
I called to check status of the auth [id number] it has been sent to an MD review. The nurse could not approve at her level. I asked about expediting but they said nothing could be done.

## 2018-11-10 NOTE — Telephone Encounter (Signed)
Patient is denied for Botox received denial letter today. Patient is scheduled for tomorrow morning.Marland Kitchen looks like the denial may be due to the patient currently being a CGRP. Please advise.

## 2018-11-10 NOTE — Telephone Encounter (Signed)
Per Andee Poles, pt told her that Ajovy was denied as well.

## 2018-11-11 ENCOUNTER — Telehealth: Payer: Self-pay | Admitting: Neurology

## 2018-11-11 ENCOUNTER — Ambulatory Visit (INDEPENDENT_AMBULATORY_CARE_PROVIDER_SITE_OTHER): Payer: BLUE CROSS/BLUE SHIELD | Admitting: Neurology

## 2018-11-11 DIAGNOSIS — G43709 Chronic migraine without aura, not intractable, without status migrainosus: Secondary | ICD-10-CM

## 2018-11-11 MED ORDER — PROPRANOLOL HCL 10 MG PO TABS: ORAL_TABLET | ORAL | 6 refills | Status: DC

## 2018-11-11 NOTE — Progress Notes (Signed)
Botox- 100 units x 2 vials Lot: [id number] Expiration: 02/2021 NDC: 5670-1410-30 & Lot: [id number] Exp: 06/2020 NDC: 8757-9728-20  Bacteriostatic 0.9% Sodium Chloride- 71mL total Lot: [id number] Expiration: 07/13/2019 NDC: 5379-4327-61  Dx: Y70.929 Sample & vial previously received from pharmacy

## 2018-11-11 NOTE — Telephone Encounter (Signed)
Patient is pending approval before scheduling another injection. Please refer to previous phone notes.

## 2018-11-11 NOTE — Telephone Encounter (Signed)
Botox inj

## 2018-11-11 NOTE — Progress Notes (Signed)
Consent Form Botulism Toxin Injection For Chronic Migraine   Interval update 11/11/2018: Migraines reduced to 2-3 migraine days a month >75% improvement in migraines. Has only used rescue medication 2 times since last botox. Excellent response.  +levator,  +masseters. extra in the occipital areas bilaterally and also paraspinals bilaterally.    Consent Form Botulism Toxin Injection For Chronic Migraine    Reviewed orally with patient, additionally signature is on file:  Botulism toxin has been approved by the Federal drug administration for treatment of chronic migraine. Botulism toxin does not cure chronic migraine and it may not be effective in some patients.  The administration of botulism toxin is accomplished by injecting a small amount of toxin into the muscles of the neck and head. Dosage must be titrated for each individual. Any benefits resulting from botulism toxin tend to wear off after 3 months with a repeat injection required if benefit is to be maintained. Injections are usually done every 3-4 months with maximum effect peak achieved by about 2 or 3 weeks. Botulism toxin is expensive and you should be sure of what costs you will incur resulting from the injection.  The side effects of botulism toxin use for chronic migraine may include:   -Transient, and usually mild, facial weakness with facial injections  -Transient, and usually mild, head or neck weakness with head/neck injections  -Reduction or loss of forehead facial animation due to forehead muscle weakness  -Eyelid drooping  -Dry eye  -Pain at the site of injection or bruising at the site of injection  -Double vision  -Potential unknown long term risks  Contraindications: You should not have Botox if you are pregnant, nursing, allergic to albumin, have an infection, skin condition, or muscle weakness at the site of the injection, or have myasthenia gravis, Lambert-Eaton syndrome, or ALS.  It is also  possible that as with any injection, there may be an allergic reaction or no effect from the medication. Reduced effectiveness after repeated injections is sometimes seen and rarely infection at the injection site may occur. All care will be taken to prevent these side effects. If therapy is given over a long time, atrophy and wasting in the muscle injected may occur. Occasionally the patient's become refractory to treatment because they develop antibodies to the toxin. In this event, therapy needs to be modified.  I have read the above information and consent to the administration of botulism toxin.    BOTOX PROCEDURE NOTE FOR MIGRAINE HEADACHE    Contraindications and precautions discussed with patient(above). Aseptic procedure was observed and patient tolerated procedure. Procedure performed by Dr. Georgia Dom  The condition has existed for more than 6 months, and pt does not have a diagnosis of ALS, Myasthenia Gravis or Lambert-Eaton Syndrome.  Risks and benefits of injections discussed and pt agrees to proceed with the procedure.  Written consent obtained  These injections are medically necessary. Pt  receives good benefits from these injections. These injections do not cause sedations or hallucinations which the oral therapies may cause.  Description of procedure:  The patient was placed in a sitting position. The standard protocol was used for Botox as follows, with 5 units of Botox injected at each site:   -Procerus muscle, midline injection  -Corrugator muscle, bilateral injection  -Frontalis muscle, bilateral injection, with 2 sites each side, medial injection was performed in the upper one third of the frontalis muscle, in the region vertical from the medial inferior edge of the superior orbital  rim. The lateral injection was again in the upper one third of the forehead vertically above the lateral limbus of the cornea, 1.5 cm lateral to the medial injection site.  - Levator  Scapulae: 5 units bilaterally  -Temporalis muscle injection, 5 sites, bilaterally. The first injection was 3 cm above the tragus of the ear, second injection site was 1.5 cm to 3 cm up from the first injection site in line with the tragus of the ear. The third injection site was 1.5-3 cm forward between the first 2 injection sites. The fourth injection site was 1.5 cm posterior to the second injection site. 5th site laterally in the temporalis  muscleat the level of the outer canthus.  -Occipitalis muscle injection, 3, bilaterally. The first injection was done one half way between the occipital protuberance and the tip of the mastoid process behind the ear. The second injection site was done lateral and superior to the first, 1 fingerbreadth from the first injection. The third injection site was 1 fingerbreadth superiorly and medially from the first injection site. Additional units at each site instead of 5 units.  -Cervical paraspinal muscle injection, 2 sites, bilateral knee first injection site was 1 cm from the midline of the cervical spine, 3 cm inferior to the lower border of the occipital protuberance. The second injection site was 1.5 cm superiorly and laterally to the first injection site. Additional units used here instead of 5 at each site  -Trapezius muscle injection was performed at 3 sites, bilaterally. The first injection site was in the upper trapezius muscle halfway between the inflection point of the neck, and the acromion. The second injection site was one half way between the acromion and the first injection site. The third injection was done between the first injection site and the inflection point of the neck.   Will return for repeat injection in 3 months.   A 200 unit sof Botox was used, any Botox not injected was wasted. The patient tolerated the procedure well, there were no complications of the above procedure.

## 2018-11-14 ENCOUNTER — Telehealth: Payer: Self-pay | Admitting: Neurology

## 2018-11-14 NOTE — Telephone Encounter (Signed)
[employer] had no prior approval on file (new insurance) so they requested our office complete the initial request form. Form completed, signed & faxed back to [facility]. Received a receipt of confirmation.

## 2018-11-14 NOTE — Telephone Encounter (Signed)
Cathy[facility] needs directions on how often pt takes propranolol (INDERAL) 10 MG tablet. Please call to advise at [phone]

## 2018-11-14 NOTE — Telephone Encounter (Signed)
Discussed, she can take it up to twice daily acutely for migraine fyi thanks thanks

## 2018-11-15 NOTE — Telephone Encounter (Signed)
Received denial for Holly Jenkins from [facility] [place]. The reason being: Aimovig and Emgality have been tried and did not work, or both were not tolerated.   Pt was just given an Holly Jenkins savings card from Dr. Jaynee Jenkins to try. Will address this with MD if pt is unable to continue getting Holly Jenkins.

## 2018-12-20 NOTE — Telephone Encounter (Signed)
Patient called to inquire about this and how we will proceed from her. Would Dr. Jaynee Eagles like for me to set up a peer to peer?

## 2018-12-20 NOTE — Telephone Encounter (Signed)
p2p option [phone]. Ext [phone].

## 2018-12-27 NOTE — Telephone Encounter (Signed)
I called an dit appears the documentation we sent in was from 2017/2018(?) which is why it was declined per physician I spoke to. I am not sure what we sent in but we should review what notes we sent in. I need an appointment with patient for a new appointment and then the new notes can be sent in for approval.

## 2018-12-28 NOTE — Telephone Encounter (Signed)
Lovena Le, would you mind calling patient and getting her scheduled with Amy? I need an appointment so we can dispute the Botox denial. Amy is fine, I will discuss with her as well.

## 2018-12-29 ENCOUNTER — Telehealth: Payer: Self-pay | Admitting: Neurology

## 2018-12-29 NOTE — Telephone Encounter (Signed)
Called patient and lvm asking her to call us back to set up an appointment to discuss botox denial.

## 2018-12-29 NOTE — Telephone Encounter (Signed)
Patient called in and stated she received a call from someone concerning her botox appt stating she needs to come in for a follow up appt with Dr Jaynee Eagles. Please advise

## 2019-01-02 NOTE — Telephone Encounter (Signed)
Please refer to previous phone note from [facility].

## 2019-02-02 ENCOUNTER — Telehealth: Payer: Self-pay | Admitting: *Deleted

## 2019-02-02 ENCOUNTER — Encounter: Payer: Self-pay | Admitting: *Deleted

## 2019-02-02 NOTE — Telephone Encounter (Signed)
Noted  

## 2019-02-02 NOTE — Telephone Encounter (Signed)
Called pt. She consented to VV/webex for upcoming appt 02-06-19 at 1000. Email sent.   Due to current COVID 19 pandemic, our office is severely reducing in office visits for at least the next 2 weeks, in order to minimize the risk to our patients and healthcare providers.  Pt understands that although there may be some limitations with this type of visit, we will take all precautions to reduce any security or privacy concerns.  Pt understands that this will be treated like an in office visit and we will file with pt's insurance, and there may be a patient responsible charge related to this service. Pt's email is [email or web address]  Pt understands that the [employer] webex software must be downloaded and operational on the device pt plans to use for the visit.  Chart updated.  She stated she is doing better since botox and monthly injections and is now weaning off her medications.  (lyrica and lexapro).

## 2019-02-02 NOTE — Telephone Encounter (Signed)
Sandy RN reached out regarding this follow up that is on AMY NP's schedule. Please refer to phone notes below for background on the follow up.   This patients f/u with Amy is coming up. The reason that notes from 2017 were sent in is because that is the last time the patient has had an office visit that wasn't an injection in our office. The patient got a new insurance policy for the new year and the reviewer requested the original OV notes to see what the patients baseline was like. This is standard for all new start request. I originally sent the more recent clinicals but per phone note on 01/23 the reviewer called and asked for additional clinicals which I also sent. It looks like per the denial letter, the patient was denied because she was documented as being on a CGRP.

## 2019-02-06 ENCOUNTER — Ambulatory Visit (INDEPENDENT_AMBULATORY_CARE_PROVIDER_SITE_OTHER): Payer: BLUE CROSS/BLUE SHIELD | Admitting: Family Medicine

## 2019-02-06 ENCOUNTER — Encounter: Payer: Self-pay | Admitting: Family Medicine

## 2019-02-06 ENCOUNTER — Other Ambulatory Visit: Payer: Self-pay

## 2019-02-06 DIAGNOSIS — G43709 Chronic migraine without aura, not intractable, without status migrainosus: Secondary | ICD-10-CM | POA: Diagnosis not present

## 2019-02-06 NOTE — Telephone Encounter (Signed)
Call and follow up with authorization after visit with Amy NP.

## 2019-02-06 NOTE — Progress Notes (Addendum)
PATIENT: Holly Jenkins DOB: 1976/10/06  REASON FOR VISIT: follow up HISTORY FROM: patient  Virtual Visit via Telephone Note  I connected with Holly Jenkins on 02/06/19 at 10:00 AM EDT by telephone and verified that I am speaking with the correct person using two identifiers.   I discussed the limitations, risks, security and privacy concerns of performing an evaluation and management service by telephone and the availability of in person appointments. I also discussed with the patient that there may be a patient responsible charge related to this service. The patient expressed understanding and agreed to proceed.   History of Present Illness:  02/06/19 Holly Jenkins is a 43 y.o. female for follow up of migraines on Botox therapy.  She reports doing well on Botox therapy.  She initially started therapy about 5 years ago.  Baseline migraines per month were about 18-20 migraine days per month for > a year.  Since starting Botox therapy her migraines have reduced to about 8/month(>50% frequency) and > 50% severity as well.  Her migraines described as unilateral, pulsating/pounding, photo/phonophobia. nausea can be moderately-severe to severe and in the past caused significant impairment to her daily life; they could last 24 hours or longer, no aura, no medication overuse.  After addition of Ajovy he her migraine days have dropped to 1-2 a month.  Unfortunately her insurance does not pay for Ajovy(she is getting Ajovy on the free copay program) and we are trying to get her botox re-approved by insurance due to her great response.  She is doing very well and tolerating medications without any adverse effects.  She is currently weaning Lexapro dose.  Current dose is 5 mg daily.  She has completely weaned off of Lyrica.  She does use propranolol as needed for headaches induced by anxiety.  She also uses Zofran as needed for nausea and Cambia as needed for migraine abortion.  She is feeling  well and without concerns today.   History (copied from Dr Cathren Laine note on 09/14/2016)  Interval history 12 /01/2016:  She has restarted botox. She has had chronic migraines for over 20 years will failure of multiple medications. We retried Topamax last month and today she is here wanting to change again. She stopped Topamax after 2 weeks due to side effects, we do not have a good history she can't remember all the medications she tried so the medications tried are possible, all she knows for sure is that she had side effects to b-blockers.   NFA:[name] Hamiltonis a 43 y.o.femalehere as a referral for migraines. PMHx migraines, cervical dystonia, fibromyalgia. Started 20 years ago, Continuous birth control helped a lot. Trigger is alcohol. Neck pain is a trigger. She has confusion, light sensitivity, nausea, no vomiting, bilateral, tightness, pressure. When severe she needs a dark room and to lay still and cool. No aura. Wakes up with them and more at night as well. When she has one, slowly progresses to 6/10 on average. They usually last 24 hours. The cambia and rizatriptan. She has daily headaches. 8 days a month are migrainous. She was due on botox the 24tj of July. She has neck pain and a diagnosis of cervical dystonia. She has tried muscle relaxers, chiropractic, massage, physical therapy. Very tight progressive muscle pain. Additional botox in the shoulders has significantly helped with the pain and limited range of motion. Previous to botox in the cervical muscles she had a 5/10 in pain all the time significantly improved. Tizanidine does not help the cervical dystonia  that it does help with the migraines. Patient is also on clonazepam which does not help with cervical dystonia. She has also tried Robaxin  Medications tried: Unclear, this is from memory. Topiramate, nortriptyline, propranolol, lyrica, "a million preventatives", tizanidine, rizatriptan and other triptans she cannot remember,  cambia, Zanaflex, Maxalt, trigger point injections in her cervical muscles, massage, Robaxin.  Reviewed notes, labs and imaging from outside physicians, which showed: Patient is being seen at [facility] in [place]. Patient has been receiving Botox injections. Per notes, Botox decrease his frequency and severity significantly, patient headaches and migraines increase when she misses her Botox injections. The headaches are mostly frontotemporal and size of the head. Some right base of her skull and she also has some discomfort in her posterior neck and down her shoulders and she has had spine surgery as well. Multifactorial situation. It appears they injected 250 units of Botox. 155 for Botox migraine protocol and additional 100 for cervical dystonia. They injected the areas where she had most of her pain as well as in her shoulders and neck. She sees a pain specialist as well for her neck pain. She was diagnosed with chronic daily migraine. It appears they also used additional Botox in the front half of her head and also in the nuchal ridge and down her neck. She has had multiple side effects from medications. She is on Zanaflex currently. Patient also is having some radiculopathy into her right arm, the Botox helped in the cervical muscles. She has noticed wearing off effect the 2 weeks prior to her Botox injections.  Reviewed MRI of the brain report. MRI of the brain 05/10/2013. Unremarkable MRI of the brain.   Observations/Objective:  Generalized: Well developed, in no acute distress  Mentation: Alert oriented to time, place, history taking. Follows all commands speech and language fluent   Assessment and Plan:  43 y.o. year old female  has a past medical history of Anxiety, Fibromyalgia, Increased thyroid stimulating hormone (TSH) level, Migraines, and Mold exposure. with    ICD-10-CM   1. Chronic migraine w/o aura w/o status migrainosus, not intractable G43.709     [place] is doing well with current Botox therapy.  We will continue medication regimen as prescribed.  Complementary therapies to treating headaches were discussed including compression with heat, ice, and relaxation techniques.  We will submit notes from today for approval to continue Botox therapy.  She was advised to reach out should headaches worsen.  She verbalizes understanding and agreement with this plan.  No orders of the defined types were placed in this encounter.   No orders of the defined types were placed in this encounter.    Follow Up Instructions:  I discussed the assessment and treatment plan with the patient. The patient was provided an opportunity to ask questions and all were answered. The patient agreed with the plan and demonstrated an understanding of the instructions.   The patient was advised to call back or seek an in-person evaluation if the symptoms worsen or if the condition fails to improve as anticipated.  I provided 20 minutes of non-face-to-face time during this encounter.  Patient is located at her place of residence during video conference.  Provider is located at her place of residence.  Liane Comber, RN helped to facilitate visit.   Debbora Presto, NP

## 2019-02-13 NOTE — Progress Notes (Signed)
Made any corrections needed to history, physical, neuro exam,assessment and plan as stated above.   Ethyl Vila, MD  

## 2019-02-16 ENCOUNTER — Telehealth: Payer: Self-pay | Admitting: Family Medicine

## 2019-02-16 NOTE — Telephone Encounter (Signed)
Please refer to previous note. DW

## 2019-02-16 NOTE — Telephone Encounter (Signed)
I spoke directly with the reviewing nurse Earlene Plater, she states the member cannot be on a CGRP and Botox at the same time. The member will need to have tried and failed the CGRP and discontinue taking it to resume the Botox. DW   I called and relayed this to the patient. We are going to wait on the PCR to come back it should be within a few days and then we will go from there. DW

## 2019-02-16 NOTE — Telephone Encounter (Signed)
I called and spoke with representative. Clinicals from the last visit with Amy need to be sent to [phone] with the cover letter stating PCR and Ref# [id number].

## 2019-02-16 NOTE — Telephone Encounter (Signed)
Noted last note has been sent.

## 2019-02-16 NOTE — Telephone Encounter (Signed)
Pt was calling to get an update on her Botox situation. Please advise.

## 2019-02-20 NOTE — Telephone Encounter (Signed)
I called to check status of the review and it is still pending. DW

## 2019-02-21 NOTE — Telephone Encounter (Signed)
I called but the reviewing nurse was away and they requested I call back.

## 2019-02-22 NOTE — Telephone Encounter (Signed)
I called to check status and spoke with Holly Jenkins who stated it was still pending. DW

## 2019-02-22 NOTE — Telephone Encounter (Signed)
I called to check status, it was still pending but the reviewing nurse stated that she was going to be working on it this morning. DW

## 2019-02-28 ENCOUNTER — Other Ambulatory Visit: Payer: Self-pay | Admitting: Neurology

## 2019-02-28 NOTE — Telephone Encounter (Signed)
I called the patient and gave her an estimate for Botox. Scheduled apt with Amy. DW

## 2019-02-28 NOTE — Telephone Encounter (Signed)
I called to check status of the patients authorization. They are upholding the denial after reviewing most recent clinical records due to the patient taking a CGRP.  The patient has called in today and stated that at this point she is willing to pay cash. Please advise on how to proceed.

## 2019-02-28 NOTE — Telephone Encounter (Signed)
Give her a quote for cash. thanks

## 2019-03-09 ENCOUNTER — Ambulatory Visit (INDEPENDENT_AMBULATORY_CARE_PROVIDER_SITE_OTHER): Payer: Self-pay | Admitting: Family Medicine

## 2019-03-09 ENCOUNTER — Encounter: Payer: Self-pay | Admitting: Family Medicine

## 2019-03-09 ENCOUNTER — Other Ambulatory Visit: Payer: Self-pay

## 2019-03-09 ENCOUNTER — Telehealth: Payer: Self-pay | Admitting: Family Medicine

## 2019-03-09 DIAGNOSIS — G43709 Chronic migraine without aura, not intractable, without status migrainosus: Secondary | ICD-10-CM

## 2019-03-09 NOTE — Telephone Encounter (Signed)
3 mo Botox inj  °

## 2019-03-09 NOTE — Progress Notes (Signed)
Botox- 100 units x 2 vials Lot: [id number] Expiration: 11/2021 NDC: 9276-3943-20  Bacteriostatic 0.9% Sodium Chloride- 51mL total Lot: [id number] Expiration: 07/13/2019 NDC: 4619-0122-24  Dx: V14.643 Self-Pay

## 2019-03-09 NOTE — Progress Notes (Signed)
Consent Form Botulism Toxin Injection For Chronic Migraine    Reviewed orally with patient, additionally signature is on file:  Botulism toxin has been approved by the Federal drug administration for treatment of chronic migraine. Botulism toxin does not cure chronic migraine and it may not be effective in some patients.  The administration of botulism toxin is accomplished by injecting a small amount of toxin into the muscles of the neck and head. Dosage must be titrated for each individual. Any benefits resulting from botulism toxin tend to wear off after 3 months with a repeat injection required if benefit is to be maintained. Injections are usually done every 3-4 months with maximum effect peak achieved by about 2 or 3 weeks. Botulism toxin is expensive and you should be sure of what costs you will incur resulting from the injection.  The side effects of botulism toxin use for chronic migraine may include:   -Transient, and usually mild, facial weakness with facial injections  -Transient, and usually mild, head or neck weakness with head/neck injections  -Reduction or loss of forehead facial animation due to forehead muscle weakness  -Eyelid drooping  -Dry eye  -Pain at the site of injection or bruising at the site of injection  -Double vision  -Potential unknown long term risks   Contraindications: You should not have Botox if you are pregnant, nursing, allergic to albumin, have an infection, skin condition, or muscle weakness at the site of the injection, or have myasthenia gravis, Lambert-Eaton syndrome, or ALS.  It is also possible that as with any injection, there may be an allergic reaction or no effect from the medication. Reduced effectiveness after repeated injections is sometimes seen and rarely infection at the injection site may occur. All care will be taken to prevent these side effects. If therapy is given over a long time, atrophy and wasting in the muscle injected may  occur. Occasionally the patient's become refractory to treatment because they develop antibodies to the toxin. In this event, therapy needs to be modified.  I have read the above information and consent to the administration of botulism toxin.    BOTOX PROCEDURE NOTE FOR MIGRAINE HEADACHE  Contraindications and precautions discussed with patient(above). Aseptic procedure was observed and patient tolerated procedure. Procedure performed by Dr. Georgia Dom  The condition has existed for more than 6 months, and pt does not have a diagnosis of ALS, Myasthenia Gravis or Lambert-Eaton Syndrome.  Risks and benefits of injections discussed and pt agrees to proceed with the procedure.  Written consent obtained  These injections are medically necessary. Pt  receives good benefits from these injections. These injections do not cause sedations or hallucinations which the oral therapies may cause.   Description of procedure:  The patient was placed in a sitting position. The standard protocol was used for Botox as follows, with 5 units of Botox injected at each site:  -Procerus muscle, midline injection  -Corrugator muscle, bilateral injection  -Frontalis muscle, bilateral injection, with 2 sites each side, medial injection was performed in the upper one third of the frontalis muscle, in the region vertical from the medial inferior edge of the superior orbital rim. The lateral injection was again in the upper one third of the forehead vertically above the lateral limbus of the cornea, 1.5 cm lateral to the medial injection site.  -Temporalis muscle injection, 4 sites, bilaterally. The first injection was 3 cm above the tragus of the ear, second injection site was 1.5 cm to 3  cm up from the first injection site in line with the tragus of the ear. The third injection site was 1.5-3 cm forward between the first 2 injection sites. The fourth injection site was 1.5 cm posterior to the second injection site.  5th site laterally in the temporalis  muscleat the level of the outer canthus.  -Occipitalis muscle injection, 3 sites, bilaterally. The first injection was done one half way between the occipital protuberance and the tip of the mastoid process behind the ear. The second injection site was done lateral and superior to the first, 1 fingerbreadth from the first injection. The third injection site was 1 fingerbreadth superiorly and medially from the first injection site.  -Cervical paraspinal muscle injection, 2 sites, bilateral knee first injection site was 1 cm from the midline of the cervical spine, 3 cm inferior to the lower border of the occipital protuberance. The second injection site was 1.5 cm superiorly and laterally to the first injection site.  -Trapezius muscle injection was performed at 3 sites, bilaterally. The first injection site was in the upper trapezius muscle halfway between the inflection point of the neck, and the acromion. The second injection site was one half way between the acromion and the first injection site. The third injection was done between the first injection site and the inflection point of the neck.   Will return for repeat injection in 3 months.   A 155 units of Botox was used, any Botox not injected was wasted. The patient tolerated the procedure well, there were no complications of the above procedure.

## 2019-03-10 NOTE — Progress Notes (Signed)
Made any corrections needed, and agree with procedure.Sarina Ill, MD [facility]

## 2019-03-15 NOTE — Telephone Encounter (Signed)
Pt returned call and has agreed to the time and date that she was scheduled for.

## 2019-03-15 NOTE — Telephone Encounter (Signed)
Noted, thank you. DW  °

## 2019-03-15 NOTE — Telephone Encounter (Signed)
I called to schedule the patient but she did not answer. I made her follow up apt and left her a VM asking her to call us back. If she calls back please make her aware of apt time and date.

## 2019-04-07 ENCOUNTER — Other Ambulatory Visit: Payer: Self-pay

## 2019-04-10 ENCOUNTER — Other Ambulatory Visit: Payer: Self-pay

## 2019-04-10 ENCOUNTER — Encounter: Payer: Self-pay | Admitting: Women's Health

## 2019-04-10 ENCOUNTER — Ambulatory Visit (INDEPENDENT_AMBULATORY_CARE_PROVIDER_SITE_OTHER): Payer: BC Managed Care – PPO | Admitting: Women's Health

## 2019-04-10 VITALS — BP 100/82 | Ht 67.5 in | Wt 142.0 lb

## 2019-04-10 DIAGNOSIS — Z01419 Encounter for gynecological examination (general) (routine) without abnormal findings: Secondary | ICD-10-CM

## 2019-04-10 MED ORDER — ETONOGESTREL-ETHINYL ESTRADIOL 0.12-0.015 MG/24HR VA RING: VAGINAL_RING | VAGINAL | 4 refills | Status: DC

## 2019-04-10 NOTE — Patient Instructions (Signed)
Breast center  [phone]   Health Maintenance, Female Adopting a healthy lifestyle and getting preventive care are important in promoting health and wellness. Ask your health care provider about:  The right schedule for you to have regular tests and exams.  Things you can do on your own to prevent diseases and keep yourself healthy. What should I know about diet, weight, and exercise? Eat a healthy diet   Eat a diet that includes plenty of vegetables, fruits, low-fat dairy products, and lean protein.  Do not eat a lot of foods that are high in solid fats, added sugars, or sodium. Maintain a healthy weight Body mass index (BMI) is used to identify weight problems. It estimates body fat based on height and weight. Your health care provider can help determine your BMI and help you achieve or maintain a healthy weight. Get regular exercise Get regular exercise. This is one of the most important things you can do for your health. Most adults should:  Exercise for at least 150 minutes each week. The exercise should increase your heart rate and make you sweat (moderate-intensity exercise).  Do strengthening exercises at least twice a week. This is in addition to the moderate-intensity exercise.  Spend less time sitting. Even light physical activity can be beneficial. Watch cholesterol and blood lipids Have your blood tested for lipids and cholesterol at 44 years of age, then have this test every 5 years. Have your cholesterol levels checked more often if:  Your lipid or cholesterol levels are high.  You are older than 44 years of age.  You are at high risk for heart disease. What should I know about cancer screening? Depending on your health history and family history, you may need to have cancer screening at various ages. This may include screening for:  Breast cancer.  Cervical cancer.  Colorectal cancer.  Skin cancer.  Lung cancer. What should I know about heart disease,  diabetes, and high blood pressure? Blood pressure and heart disease  High blood pressure causes heart disease and increases the risk of stroke. This is more likely to develop in people who have high blood pressure readings, are of African descent, or are overweight.  Have your blood pressure checked: ? Every 3-5 years if you are 44-44 years of age. ? Every year if you are 44 years old or older. Diabetes Have regular diabetes screenings. This checks your fasting blood sugar level. Have the screening done:  Once every three years after age 44 if you are at a normal weight and have a low risk for diabetes.  More often and at a younger age if you are overweight or have a high risk for diabetes. What should I know about preventing infection? Hepatitis B If you have a higher risk for hepatitis B, you should be screened for this virus. Talk with your health care provider to find out if you are at risk for hepatitis B infection. Hepatitis C Testing is recommended for:  Everyone born from [birth year] through 1966.  Anyone with known risk factors for hepatitis C. Sexually transmitted infections (STIs)  Get screened for STIs, including gonorrhea and chlamydia, if: ? You are sexually active and are younger than 44 years of age. ? You are older than 44 years of age and your health care provider tells you that you are at risk for this type of infection. ? Your sexual activity has changed since you were last screened, and you are at increased risk for chlamydia or gonorrhea.  Ask your health care provider if you are at risk.  Ask your health care provider about whether you are at high risk for HIV. Your health care provider may recommend a prescription medicine to help prevent HIV infection. If you choose to take medicine to prevent HIV, you should first get tested for HIV. You should then be tested every 3 months for as long as you are taking the medicine. Pregnancy  If you are about to stop having your  period (premenopausal) and you may become pregnant, seek counseling before you get pregnant.  Take 400 to 800 micrograms (mcg) of folic acid every day if you become pregnant.  Ask for birth control (contraception) if you want to prevent pregnancy. Osteoporosis and menopause Osteoporosis is a disease in which the bones lose minerals and strength with aging. This can result in bone fractures. If you are 44 years old or older, or if you are at risk for osteoporosis and fractures, ask your health care provider if you should:  Be screened for bone loss.  Take a calcium or vitamin D supplement to lower your risk of fractures.  Be given hormone replacement therapy (HRT) to treat symptoms of menopause. Follow these instructions at home: Lifestyle  Do not use any products that contain nicotine or tobacco, such as cigarettes, e-cigarettes, and chewing tobacco. If you need help quitting, ask your health care provider.  Do not use street drugs.  Do not share needles.  Ask your health care provider for help if you need support or information about quitting drugs. Alcohol use  Do not drink alcohol if: ? Your health care provider tells you not to drink. ? You are pregnant, may be pregnant, or are planning to become pregnant.  If you drink alcohol: ? Limit how much you use to 0-1 drink a day. ? Limit intake if you are breastfeeding.  Be aware of how much alcohol is in your drink. In the [place] one drink equals one 12 oz bottle of beer (355 mL), one 5 oz glass of wine (148 mL), or one 1 oz glass of hard liquor (44 mL). General instructions  Schedule regular health, dental, and eye exams.  Stay current with your vaccines.  Tell your health care provider if: ? You often feel depressed. ? You have ever been abused or do not feel safe at home. Summary  Adopting a healthy lifestyle and getting preventive care are important in promoting health and wellness.  Follow your health care provider's  instructions about healthy diet, exercising, and getting tested or screened for diseases.  Follow your health care provider's instructions on monitoring your cholesterol and blood pressure. This information is not intended to replace advice given to you by your health care provider. Make sure you discuss any questions you have with your health care provider. Document Released: 04/13/2011 Document Revised: 09/21/2018 Document Reviewed: 09/21/2018 Elsevier Patient Education  2020 [employer].

## 2019-04-10 NOTE — Progress Notes (Addendum)
Holly Jenkins 01/02/1980 [id number]    History:    Presents for annual exam.  Amenorrheic on continuous OCs for migraine prevention.  Has had increased migraines since unable to get Botox, or massages due to COVID.  Long history of migraines without aura, fibromyalgia, anxiety/depression.  Has seen a nutritionist as well as naturalist.  Has not had a screening mammogram.  Normal Pap history.  Has lost 20 pounds in the past year with lifestyle changes and is feeling better.  Past medical history, past surgical history, family history and social history were all reviewed and documented in the EPIC chart.  Works in a Engineer, technical sales for [employer].  ROS:  A ROS was performed and pertinent positives and negatives are included.  Exam:  Vitals:   04/10/19 1616  BP: 100/82  Weight: 142 lb (64.4 kg)  Height: 5' 7.5" (1.715 m)   Body mass index is 21.91 kg/m.   General appearance:  Normal Thyroid:  Symmetrical, normal in size, without palpable masses or nodularity. Respiratory  Auscultation:  Clear without wheezing or rhonchi Cardiovascular  Auscultation:  Regular rate, without rubs, murmurs or gallops  Edema/varicosities:  Not grossly evident Abdominal  Soft,nontender, without masses, guarding or rebound.  Liver/spleen:  No organomegaly noted  Hernia:  None appreciated  Skin  Inspection:  Grossly normal   Breasts: Examined lying and sitting.     Right: Mobile 2 cm mass at areola nontender, without masses, retractions, discharge or axillary adenopathy.     Left: Without masses, retractions, discharge or axillary adenopathy. Gentitourinary   Inguinal/mons:  Normal without inguinal adenopathy  External genitalia:  Normal  BUS/Urethra/Skene's glands:  Normal  Vagina:  Normal  Cervix:  Normal  Uterus:   normal in size, shape and contour.  Midline and mobile  Adnexa/parametria:     Rt: Without masses or tenderness.   Lt: Without masses or tenderness.  Anus and  perineum: Normal  Digital rectal exam: Normal sphincter tone without palpated masses or tenderness  Assessment/Plan:  43 y.o. MWF G0 for annual exam with no complaints.  Continuous OCs/amenorrheic for migraine prevention Fibromyalgia, anxiety/depression-primary care manages labs and supplements Migraines without aura 2 cm mass right breast  Plan: Diagnostic mammogram/ultrasound of right breast . options reviewed we will try NuvaRing continuously, reviewed it is good for 28 days can leave in 4 weeks replace, instructed to call if any bleeding/spotting or increase in headaches.  Reviewed it is a lower estrogen then Zovia and may have less headaches.  Risks of blood clots and strokes reviewed with any OC or NuvaRing.  SBEs, reviewed importance of annual screening mammogram, breast center information given instructed to schedule.  Congratulated on weight loss with diet and exercise, continue healthy lifestyle, calcium rich foods, continue vitamin D supplement.  Self-care and leisure activities encouraged.  Pap normal 2019, new screening guidelines reviewed.  [place], 5:15 PM 04/10/2019

## 2019-04-11 ENCOUNTER — Telehealth: Payer: Self-pay | Admitting: *Deleted

## 2019-04-11 DIAGNOSIS — N63 Unspecified lump in unspecified breast: Secondary | ICD-10-CM

## 2019-04-11 NOTE — Telephone Encounter (Signed)
She had a 2cm mobile smooth nodule/ mass non tender at the areola on the right breast 9:00.   I made an addendum to the chart, I did forget to put it in the chart and send you the message.

## 2019-04-11 NOTE — Telephone Encounter (Signed)
Holly Jenkins please see the below note, I didn't  received a message to schedule this, I called patient and she said you found a breast lump? I reviewed the visit for today and didn't see anything regarding this. Please advise

## 2019-04-11 NOTE — Telephone Encounter (Signed)
Patient scheduled on 04/18/19 @ 8:00am at breast center of [facility], patient informed, to come alone to visit, wear face mask due to COVID.

## 2019-04-11 NOTE — Telephone Encounter (Signed)
-----   Message from Sinclair Grooms sent at 04/10/2019  5:02 PM EDT ----- Regarding: diagnostic mammo Holly Jenkins will send you a order for diagnostic mammogram patient wants it to be the earliest in the morning or the latest in the day.

## 2019-04-12 HISTORY — PX: BREAST BIOPSY: SHX20

## 2019-04-18 ENCOUNTER — Other Ambulatory Visit: Payer: Self-pay

## 2019-04-18 ENCOUNTER — Ambulatory Visit
Admission: RE | Admit: 2019-04-18 | Discharge: 2019-04-18 | Disposition: A | Discharge status: Home / Self Care | Payer: BC Managed Care – PPO | Source: Ambulatory Visit | Attending: Gynecology | Admitting: Gynecology

## 2019-04-18 ENCOUNTER — Ambulatory Visit
Admission: RE | Admit: 2019-04-18 | Discharge: 2019-04-18 | Disposition: A | Discharge status: Home / Self Care | Payer: 59 | Source: Ambulatory Visit | Attending: Gynecology | Admitting: Gynecology

## 2019-04-18 ENCOUNTER — Other Ambulatory Visit: Payer: Self-pay | Admitting: Gynecology

## 2019-04-18 DIAGNOSIS — N63 Unspecified lump in unspecified breast: Secondary | ICD-10-CM

## 2019-04-18 DIAGNOSIS — N6310 Unspecified lump in the right breast, unspecified quadrant: Secondary | ICD-10-CM

## 2019-04-21 ENCOUNTER — Ambulatory Visit
Admission: RE | Admit: 2019-04-21 | Discharge: 2019-04-21 | Disposition: A | Discharge status: Home / Self Care | Payer: BC Managed Care – PPO | Source: Ambulatory Visit | Attending: Gynecology | Admitting: Gynecology

## 2019-04-21 ENCOUNTER — Other Ambulatory Visit: Payer: Self-pay

## 2019-04-21 DIAGNOSIS — N6310 Unspecified lump in the right breast, unspecified quadrant: Secondary | ICD-10-CM

## 2019-04-25 ENCOUNTER — Encounter: Payer: Self-pay | Admitting: *Deleted

## 2019-04-25 ENCOUNTER — Telehealth: Payer: Self-pay | Admitting: Hematology and Oncology

## 2019-04-25 ENCOUNTER — Other Ambulatory Visit: Payer: Self-pay | Admitting: *Deleted

## 2019-04-25 DIAGNOSIS — C50511 Malignant neoplasm of lower-outer quadrant of right female breast: Secondary | ICD-10-CM

## 2019-04-25 DIAGNOSIS — Z170 Estrogen receptor positive status [ER+]: Secondary | ICD-10-CM

## 2019-04-25 NOTE — Telephone Encounter (Signed)
Spoke to patient to confirm morning [facility] appointment for 7/22, packet emailed to patient

## 2019-04-28 ENCOUNTER — Telehealth: Payer: Self-pay | Admitting: Family Medicine

## 2019-04-28 NOTE — Telephone Encounter (Signed)
Pt called wanting to discuss a bill she received. Please advise.

## 2019-05-02 NOTE — Progress Notes (Signed)
[place] NOTE  Patient Care Team: Marda Stalker, PA-C as PCP - General (Family Medicine) Mauro Kaufmann, RN as Oncology Nurse Navigator Rockwell Germany, RN as Oncology Nurse Navigator Fanny Skates, MD as Consulting Physician (General Surgery) Nicholas Lose, MD as Consulting Physician (Hematology and Oncology) Eppie Gibson, MD as Attending Physician (Radiation Oncology)  CHIEF COMPLAINTS/PURPOSE OF CONSULTATION:  Newly diagnosed breast cancer  HISTORY OF PRESENTING ILLNESS:  Holly Jenkins 43 y.o. female is here because of recent diagnosis of invasive ductal carcinoma of the right breast. The cancer was detected on a diagnostic mammogram on 04/18/19 after it was identified on a clinical examination. It showed a 1.6cm indeterminate mass in the lower outer right breast and no abnormal axillary lymph nodes. Biopsy on 04/21/19 showed invasive ductal carcinoma, grade 2, HER-2 negative (1+), ER 100%, PR 100%, Ki67 2%. She presents to the clinic today for initial evaluation.   I reviewed her records extensively and collaborated the history with the patient.  SUMMARY OF ONCOLOGIC HISTORY: Oncology History  Malignant neoplasm of lower-outer quadrant of right breast of female, estrogen receptor positive ([place])  04/25/2019 Initial Diagnosis   Right breast mass identified on clinical examination. Mammogram showed 1.6cm indeterminate mass in the lower outer right breast, no abnormal axillary lymph nodes. 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    Migraines    Mold exposure    toxicity    SURGICAL HISTORY: Past Surgical History:  Procedure Laterality Date   CERVICAL SPINE SURGERY     c5-c6-c7 fusion with disc transplants    SOCIAL HISTORY: Social History     Socioeconomic History   Marital status: Married    Spouse name: Not on file   Number of children: Not on file   Years of education: Not on file   Highest education level: Not on file  Occupational History   Not on file  Social Needs   Financial resource strain: Not on file   Food insecurity    Worry: Not on file    Inability: Not on file   Transportation needs    Medical: Not on file    Non-medical: Not on file  Tobacco Use   Smoking status: Never Smoker   Smokeless tobacco: Never Used  Substance and Sexual Activity   Alcohol use: No    Frequency: Never   Drug use: No   Sexual activity: Yes    Comment: intercourse age 19, less than 5 sexual partners  Lifestyle   Physical activity    Days per week: Not on file    Minutes per session: Not on file   Stress: Not on file  Relationships   Social connections    Talks on phone: Not on file    Gets together: Not on file    Attends religious service: Not on file    Active member of club or organization: Not on file    Attends meetings of clubs or organizations: Not on file    Relationship status: Not on file   Intimate partner violence    Fear of current or ex partner: Not on file    Emotionally abused: Not on file    Physically abused: Not on file    Forced sexual  activity: Not on file  Other Topics Concern   Not on file  Social History Narrative   Lives at home w/ her husband    FAMILY HISTORY: Family History  Problem Relation Age of Onset   Healthy Mother    Diabetes Maternal Grandmother     ALLERGIES:  is allergic to dust mite extract; eggs or egg-derived products; gluten meal; thimerosal; peanut-containing drug products; and sulfites.  MEDICATIONS:  Current Outpatient Medications  Medication Sig Dispense Refill   AJOVY 225 MG/1.5ML SOSY INJECT 1 syringe SUBCUTANEOUSLY EVERY 30 DAYS 1.5 mL 4   Ascorbic Acid (VITA-C PO) Take 1 tablet by mouth at bedtime.      Barberry-Oreg  Grape-Goldenseal (BERBERINE COMPLEX PO) Take 1 tablet by mouth daily.     Bilberry 100 MG CAPS Take 1 capsule by mouth. Daily at night     BLACK CURRANT SEED OIL PO Take 1 tablet by mouth 2 (two) times daily.     botulinum toxin Type A (BOTOX) 100 units SOLR injection Inject IM every 3 months into head and neck muscles by provider in the office 2 vial 3   clonazePAM (KLONOPIN) 0.5 MG tablet Take 0.5 mg by mouth 2 (two) times daily as needed for anxiety.     etonogestrel-ethinyl estradiol (NUVARING) 0.12-0.015 MG/24HR vaginal ring Insert vaginally and leave in place for 4 consecutive weeks, then replace with new one. 3 each 4   Melatonin-Pyridoxine (MELATIN PO) Take 1 tablet by mouth daily.     Menaquinone-7 (VITAMIN K2 PO) Take 1 tablet by mouth daily.     Multiple Vitamins-Minerals (ZINC PO) Take 1 tablet by mouth 2 (two) times daily before a meal.      NON FORMULARY D-HIST  2 TABS PO QD     NON FORMULARY Take 1 tablet by mouth. ALA     NON FORMULARY Take 1 tablet by mouth 2 (two) times daily before a meal. [place]     NON FORMULARY Take 1 tablet by mouth daily. Trichromium daily with food     NON FORMULARY Take 2 tablets by mouth every morning. A-Drenal     NON FORMULARY Take 1 tablet by mouth 2 (two) times daily before a meal. L-Carnatine  Two times daily (morning and afternoon)     NON FORMULARY Take 1 tablet by mouth. Methylation Complete 1 tab daily in the afternoon under the tongue     NON FORMULARY Take 1 tablet by mouth daily. P-5-P one tablet daily in the afternoon     NON FORMULARY Take 4 tablets by mouth. Mega IgG 2000 daily in the afternoon     Omega-3 Fatty Acids (OMEGA 3 PO) Take 1 tablet by mouth daily.     ondansetron (ZOFRAN) 4 MG tablet Take acutely at onset of migraine or nausea. As needed.Maximum 4x a day 20 tablet 6   OVER THE COUNTER MEDICATION Take 1 tablet by mouth daily. Vitamin D3     OVER THE COUNTER MEDICATION Take 1 tablet by mouth. Iron one  tablet daily in the afternoon     PHOSPHATIDYLSERINE PO Take 2 tablets by mouth 2 (two) times daily.     propranolol (INDERAL) 10 MG tablet Take acutely for migraine 60 tablet 6   Thyroid (NATURE-THROID PO) Take by mouth daily.     TURMERIC PO Take by mouth daily.     VITAMIN B COMPLEX-C PO Take 1 tablet by mouth daily.     Diclofenac Potassium (CAMBIA) 50 MG PACK MIX 1 PACKET  WITH 1-2 OUNCES OF WATER. DRINK IMMEDIATELY AS A SINGLE DOSE 9 each 11   escitalopram (LEXAPRO) 10 MG tablet Take 10 mg by mouth daily.      metoCLOPramide (REGLAN) 10 MG tablet Take 1 tablet (10 mg total) by mouth every 6 (six) hours as needed for nausea. May take for migraine. 30 tablet 6   pregabalin (LYRICA) 75 MG capsule Take 75 mg by mouth daily.     Probiotic Product (PROBIOTIC PO) Take by mouth.     No current facility-administered medications for this visit.     REVIEW OF SYSTEMS:   Constitutional: Denies fevers, chills or abnormal night sweats Eyes: Denies blurriness of vision, double vision or watery eyes Ears, nose, mouth, throat, and face: Denies mucositis or sore throat Respiratory: Denies cough, dyspnea or wheezes Cardiovascular: Denies palpitation, chest discomfort or lower extremity swelling Gastrointestinal:  Denies nausea, heartburn or change in bowel habits Skin: Denies abnormal skin rashes Lymphatics: Denies new lymphadenopathy or easy bruising Neurological:Denies numbness, tingling or new weaknesses Behavioral/Psych: Mood is stable, no new changes  Breast: Palpable right breast lump All other systems were reviewed with the patient and are negative.  PHYSICAL EXAMINATION: ECOG PERFORMANCE STATUS: 1 - Symptomatic but completely ambulatory  Vitals:   05/03/19 0854  BP: 111/67  Pulse: 82  Resp: 20  Temp: 98.2 F (36.8 C)  SpO2: 100%   Filed Weights   05/03/19 0854  Weight: 142 lb (64.4 kg)    GENERAL:alert, no distress and comfortable SKIN: skin color, texture, turgor  are normal, no rashes or significant lesions EYES: normal, conjunctiva are pink and non-injected, sclera clear OROPHARYNX:no exudate, no erythema and lips, buccal mucosa, and tongue normal  NECK: supple, thyroid normal size, non-tender, without nodularity LYMPH:  no palpable lymphadenopathy in the cervical, axillary or inguinal LUNGS: clear to auscultation and percussion with normal breathing effort HEART: regular rate & rhythm and no murmurs and no lower extremity edema ABDOMEN:abdomen soft, non-tender and normal bowel sounds Musculoskeletal:no cyanosis of digits and no clubbing  PSYCH: alert & oriented x 3 with fluent speech NEURO: no focal motor/sensory deficits BREAST: Small palpable lump in the right breast. No palpable axillary or supraclavicular lymphadenopathy (exam performed in the presence of a chaperone)   LABORATORY DATA:  I have reviewed the data as listed Lab Results  Component Value Date   WBC 7.4 05/03/2019   HGB 12.7 05/03/2019   HCT 38.8 05/03/2019   MCV 87.8 05/03/2019   PLT 256 05/03/2019   Lab Results  Component Value Date   NA 139 05/03/2019   K 4.2 05/03/2019   CL 105 05/03/2019   CO2 24 05/03/2019    RADIOGRAPHIC STUDIES: I have personally reviewed the radiological reports and agreed with the findings in the report.  ASSESSMENT AND PLAN:  Malignant neoplasm of lower-outer quadrant of right breast of female, estrogen receptor positive ([place]) 04/25/2019:Right breast mass identified on clinical examination. Mammogram showed 1.6cm indeterminate mass in the lower outer right breast, no abnormal axillary lymph nodes. 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  Pathology and radiology counseling:Discussed with the patient, the details of pathology including the type of breast cancer,the clinical staging, the significance of ER, PR and HER-2/neu receptors and the implications for treatment. After reviewing the pathology in detail, we  proceeded to discuss the different treatment options between surgery, radiation, chemotherapy, antiestrogen therapies.  Recommendations: 1. Breast conserving surgery followed by 2. Oncotype DX testing to determine  if chemotherapy would be of any benefit followed by 3. Adjuvant radiation therapy followed by 4. Adjuvant antiestrogen therapy with tamoxifen 20 mg daily x10 years  Oncotype counseling: I discussed Oncotype DX test. I explained to the patient that this is a 21 gene panel to evaluate patient tumors DNA to calculate recurrence score. This would help determine whether patient has high risk or intermediate risk or low risk breast cancer. She understands that if her tumor was found to be high risk, she would benefit from systemic chemotherapy. If low risk, no need of chemotherapy. If she was found to be intermediate risk, we would need to evaluate the score as well as other risk factors and determine if an abbreviated chemotherapy may be of benefit.  Oral contraception: Patient stopped oral contraceptive pills when she was diagnosed with breast cancer. She was using oral contraception for migraine prevention as well. Since she is on Botox injections as well as a concoction of other drugs, we are hoping that the migraines will not recur.  Birth control: Patient is contemplating on nephrectomy at a later time for birth control.  Return to clinic after surgery to discuss final pathology report and then determine if Oncotype DX testing will need to be sent.     All questions were answered. The patient knows to call the clinic with any problems, questions or concerns.   Rulon Eisenmenger, MD 05/03/2019    I, Molly Dorshimer, am acting as scribe for Nicholas Lose, MD.  I have reviewed the above documentation for accuracy and completeness, and I agree with the above.

## 2019-05-03 ENCOUNTER — Inpatient Hospital Stay: Payer: BC Managed Care – PPO

## 2019-05-03 ENCOUNTER — Other Ambulatory Visit: Payer: Self-pay | Admitting: General Surgery

## 2019-05-03 ENCOUNTER — Inpatient Hospital Stay: Payer: BC Managed Care – PPO | Attending: Hematology and Oncology | Admitting: Hematology and Oncology

## 2019-05-03 ENCOUNTER — Other Ambulatory Visit: Payer: Self-pay | Admitting: *Deleted

## 2019-05-03 ENCOUNTER — Encounter: Payer: Self-pay | Admitting: Licensed Clinical Social Worker

## 2019-05-03 ENCOUNTER — Encounter: Payer: Self-pay | Admitting: Radiation Oncology

## 2019-05-03 ENCOUNTER — Other Ambulatory Visit: Payer: Self-pay

## 2019-05-03 ENCOUNTER — Ambulatory Visit
Admission: RE | Admit: 2019-05-03 | Discharge: 2019-05-03 | Disposition: A | Discharge status: Home / Self Care | Payer: BC Managed Care – PPO | Source: Ambulatory Visit | Attending: Radiation Oncology | Admitting: Radiation Oncology

## 2019-05-03 ENCOUNTER — Ambulatory Visit (HOSPITAL_BASED_OUTPATIENT_CLINIC_OR_DEPARTMENT_OTHER): Payer: BC Managed Care – PPO | Admitting: Licensed Clinical Social Worker

## 2019-05-03 ENCOUNTER — Encounter: Payer: Self-pay | Admitting: Hematology and Oncology

## 2019-05-03 DIAGNOSIS — C50511 Malignant neoplasm of lower-outer quadrant of right female breast: Secondary | ICD-10-CM

## 2019-05-03 DIAGNOSIS — Z170 Estrogen receptor positive status [ER+]: Secondary | ICD-10-CM

## 2019-05-03 LAB — CBC WITH DIFFERENTIAL (CANCER CENTER ONLY)
Abs Immature Granulocytes: 0.02 10*3/uL (ref 0.00–0.07)
Basophils Absolute: 0 10*3/uL (ref 0.0–0.1)
Basophils Relative: 0 %
Eosinophils Absolute: 0.1 10*3/uL (ref 0.0–0.5)
Eosinophils Relative: 1 %
HCT: 38.8 % (ref 36.0–46.0)
Hemoglobin: 12.7 g/dL (ref 12.0–15.0)
Immature Granulocytes: 0 %
Lymphocytes Relative: 22 %
Lymphs Abs: 1.7 10*3/uL (ref 0.7–4.0)
MCH: 28.7 pg (ref 26.0–34.0)
MCHC: 32.7 g/dL (ref 30.0–36.0)
MCV: 87.8 fL (ref 80.0–100.0)
Monocytes Absolute: 0.4 10*3/uL (ref 0.1–1.0)
Monocytes Relative: 5 %
Neutro Abs: 5.3 10*3/uL (ref 1.7–7.7)
Neutrophils Relative %: 72 %
Platelet Count: 256 10*3/uL (ref 150–400)
RBC: 4.42 MIL/uL (ref 3.87–5.11)
RDW: 13.2 % (ref 11.5–15.5)
WBC Count: 7.4 10*3/uL (ref 4.0–10.5)
nRBC: 0 % (ref 0.0–0.2)

## 2019-05-03 LAB — CMP (CANCER CENTER ONLY)
ALT: 11 U/L (ref 0–44)
AST: 13 U/L — ABNORMAL LOW (ref 15–41)
Albumin: 4 g/dL (ref 3.5–5.0)
Alkaline Phosphatase: 39 U/L (ref 38–126)
Anion gap: 10 (ref 5–15)
BUN: 14 mg/dL (ref 6–20)
CO2: 24 mmol/L (ref 22–32)
Calcium: 9.3 mg/dL (ref 8.9–10.3)
Chloride: 105 mmol/L (ref 98–111)
Creatinine: 0.73 mg/dL (ref 0.44–1.00)
GFR, Est AFR Am: >60 mL/min (ref >60)
GFR, Estimated: >60 mL/min (ref >60)
Glucose, Bld: 98 mg/dL (ref 70–99)
Potassium: 4.2 mmol/L (ref 3.5–5.1)
Sodium: 139 mmol/L (ref 135–145)
Total Bilirubin: 0.4 mg/dL (ref 0.3–1.2)
Total Protein: 7.1 g/dL (ref 6.5–8.1)

## 2019-05-03 NOTE — Assessment & Plan Note (Signed)
04/25/2019:Right breast mass identified on clinical examination. Mammogram showed 1.6cm indeterminate mass in the lower outer right breast, no abnormal axillary lymph nodes. 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  Pathology and radiology counseling:Discussed with the patient, the details of pathology including the type of breast cancer,the clinical staging, the significance of ER, PR and HER-2/neu receptors and the implications for treatment. After reviewing the pathology in detail, we proceeded to discuss the different treatment options between surgery, radiation, chemotherapy, antiestrogen therapies.  Recommendations: 1. Breast conserving surgery followed by 2. Oncotype DX testing to determine if chemotherapy would be of any benefit followed by 3. Adjuvant radiation therapy followed by 4. Adjuvant antiestrogen therapy with tamoxifen 20 mg daily x10 years  Oncotype counseling: I discussed Oncotype DX test. I explained to the patient that this is a 21 gene panel to evaluate patient tumors DNA to calculate recurrence score. This would help determine whether patient has high risk or intermediate risk or low risk breast cancer. She understands that if her tumor was found to be high risk, she would benefit from systemic chemotherapy. If low risk, no need of chemotherapy. If she was found to be intermediate risk, we would need to evaluate the score as well as other risk factors and determine if an abbreviated chemotherapy may be of benefit.  Oral contraception: Patient stopped oral contraceptive pills when she was diagnosed with breast cancer. She was using oral contraception for migraine prevention as well. Since she is on Botox injections as well as a concoction of other drugs, we are hoping that the migraines will not recur.  Birth control: Patient is contemplating on nephrectomy at a later time for birth control.  Return to clinic after surgery to discuss final  pathology report and then determine if Oncotype DX testing will need to be sent.

## 2019-05-03 NOTE — Progress Notes (Signed)
REFERRING PROVIDER: Nicholas Lose, MD [address] [place],  [place] [address]  PRIMARY PROVIDER:  Marda Stalker, PA-C  PRIMARY REASON FOR VISIT:  1. Malignant neoplasm of lower-outer quadrant of right breast of female, estrogen receptor positive ([place])     I connected with Holly Jenkins on 05/03/2019 at 11:15 AM EDT by Webex and verified that I am speaking with the correct person using two identifiers.    Patient location: [facility] Provider location: clinic  HISTORY OF PRESENT ILLNESS:   Holly Jenkins, a 43 y.o. female, was seen for a [facility] cancer genetics consultation at the request of Dr. Lindi Adie due to a personal history of cancer.  Holly Jenkins presents to clinic today to discuss the possibility of a hereditary predisposition to cancer, genetic testing, and to further clarify her future cancer risks, as well as potential cancer risks for family members.   In 2020, at the age of 67, Holly Jenkins was diagnosed with IDC of the right breast, ER/PR+, HER2-. The treatment plan includes surgery, possible chemotherapy, adjuvant radiation therapy and adjuvant antiestrogen therapy.   CANCER HISTORY:  Oncology History  Malignant neoplasm of lower-outer quadrant of right breast of female, estrogen receptor positive ([place])  04/25/2019 Initial Diagnosis   Right breast mass identified on clinical examination. Mammogram showed 1.6cm indeterminate mass in the lower outer right breast, no abnormal axillary lymph nodes. 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   05/03/2019 Cancer Staging   Staging form: Breast, AJCC 8th Edition - Clinical stage from 05/03/2019: Stage IA (cT1c, cN0, cM0, G2, ER+, PR+, HER2-) - Signed by Nicholas Lose, MD on 05/03/2019      RISK FACTORS:  Menarche was at age 50.  First live birth at age no children.  Ovaries intact: yes.  Hysterectomy: no.  Menopausal status: premenopausal.  Mammogram within the last year:  yes.   Past Medical History:  Diagnosis Date  . Anxiety   . Fibromyalgia   . 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 Migraines   . Mold exposure    toxicity    Past Surgical History:  Procedure Laterality Date  . CERVICAL SPINE SURGERY     c5-c6-c7 fusion with disc transplants    Social History   Socioeconomic History  . Marital status: Married    Spouse name: Not on file  . Number of children: Not on file  . Years of education: Not on file  . Highest education level: Not on file  Occupational History  . Not on file  Social Needs  . Financial resource strain: Not on file  . Food insecurity    Worry: Not on file    Inability: Not on file  . Transportation needs    Medical: Not on file    Non-medical: Not on file  Tobacco Use  . Smoking status: Never Smoker  . Smokeless tobacco: Never Used  Substance and Sexual Activity  . Alcohol use: No    Frequency: Never  . Drug use: No  . Sexual activity: Yes    Comment: intercourse age 43, less than 5 sexual partners  Lifestyle  . Physical activity    Days per week: Not on file    Minutes per session: Not on file  . Stress: Not on file  Relationships  . Social Herbalist on phone: Not on file    Gets together: Not on file  Attends religious service: Not on file    Active member of club or organization: Not on file    Attends meetings of clubs or organizations: Not on file    Relationship status: Not on file  Other Topics Concern  . Not on file  Social History Narrative   Lives at home w/ her husband     FAMILY HISTORY:  We obtained a detailed, 4-generation family history.  Significant diagnoses are listed below: Family History  Problem Relation Age of Onset  . Healthy Mother   . Diabetes Maternal Grandmother     Holly Jenkins does not have children. She has one brother, 64, no cancer diagnoses.   Holly Jenkins's mother is living at 33, no history of cancer. The  patient has 2 maternal aunts, 2 maternal uncles, no cancers. No cancers in maternal cousins. Grandmother died in her 21s, grandfather died in his 1s.   Ms. Girdler's father is living at 70, no history of cancer. The patient has 1 paternal aunt, no cancers. No cancers in paternal cousin. She does not have information about her paternal grandparents but they are deceased.  Ms. Surratt is unaware of previous family history of genetic testing for hereditary cancer risks. Patient's maternal ancestors are of [place] descent, and paternal ancestors are of [place] descent. There is no reported Ashkenazi Jewish ancestry. There is no known consanguinity.  GENETIC COUNSELING ASSESSMENT: Holly Jenkins is a 43 y.o. female with a personal history which is somewhat suggestive of a hereditary cancer syndrome and predisposition to cancer. We, therefore, discussed and recommended the following at today's visit.   DISCUSSION: We discussed that 5 - 10% of breast cancer is hereditary, with most cases associated with BRCA1/BRCA2 mutations.  There are other genes that can be associated with hereditary breast cancer syndromes.  These include PALB2, CHEK2, ATM.  We discussed that testing is beneficial for several reasons including surgical decision-making for breast cancer, knowing how to follow individuals after completing their treatment, and understand if other family members could be at risk for cancer and allow them to undergo genetic testing.   We reviewed the characteristics, features and inheritance patterns of hereditary cancer syndromes. We also discussed genetic testing, including the appropriate family members to test, the process of testing, insurance coverage and turn-around-time for results. We discussed the implications of a negative, positive and/or variant of uncertain significant result. In order to get genetic test results in a timely manner so that Holly Jenkins can use these genetic test results for surgical  decisions, we recommended Holly Jenkins pursue genetic testing for the Breast Cancer STAT Panel. Once complete, we recommend Holly Jenkins pursue reflex genetic testing to the Common Hereditary Cancers gene panel.   The STAT Breast cancer panel offered by [facility] includes sequencing and rearrangement analysis for the following 9 genes:  ATM, BRCA1, BRCA2, CDH1, CHEK2, PALB2, PTEN, STK11 and TP53.    The Common Hereditary Cancers Panel offered by [facility] includes sequencing and/or deletion duplication testing of the following 48 genes: APC, ATM, AXIN2, BARD1, BMPR1A, BRCA1, BRCA2, BRIP1, CDH1, CDKN2A (p14ARF), CDKN2A (p16INK4a), CKD4, CHEK2, CTNNA1, DICER1, EPCAM (Deletion/duplication testing only), GREM1 (promoter region deletion/duplication testing only), KIT, MEN1, MLH1, MSH2, MSH3, MSH6, MUTYH, NBN, NF1, NHTL1, PALB2, PDGFRA, PMS2, POLD1, POLE, PTEN, RAD50, RAD51C, RAD51D, RNF43, SDHB, SDHC, SDHD, SMAD4, SMARCA4. STK11, TP53, TSC1, TSC2, and VHL.  The following genes were evaluated for sequence changes only: SDHA and HOXB13 c.251G>A variant only.  Based on Holly Jenkins's personal history of cancer,  she meets medical criteria for genetic testing. Despite that she meets criteria, she may still have an out of pocket cost.   PLAN: After considering the risks, benefits, and limitations, Holly Jenkins provided informed consent to pursue genetic testing and the blood sample was sent to [facility] for analysis of the Breast Cancer STAT Panel + Common Hereditary Cancers Panel. Initial results should be available within approximately 5-12 days' time, at which point they will be disclosed by telephone to Holly Jenkins, as will any additional recommendations warranted by these results. Holly Jenkins will receive a summary of her genetic counseling visit and a copy of her results once available. This information will also be available in Epic.   Lastly, we encouraged Holly Jenkins to remain in contact with  cancer genetics annually so that we can continuously update the family history and inform her of any changes in cancer genetics and testing that may be of benefit for this family.   Holly Jenkins's questions were answered to her satisfaction today. Our contact information was provided should additional questions or concerns arise. Thank you for the referral and allowing [place] to share in the care of your patient.   Faith Rogue, MS, [place] Genetic Counselor [email or web address] Phone: [phone]  The patient was seen for a total of 15 minutes in virtual genetic counseling.  Drs. Magrinat, Lindi Adie and/or Burr Medico were available for discussion regarding this case.   _______________________________________________________________________ For Office Staff:  Number of people involved in session: 1 Was an Intern/ student involved with case: no

## 2019-05-03 NOTE — Progress Notes (Signed)
Radiation Oncology         [phone] ________________________________  Initial outpatient Consultation  Name: Holly Jenkins MRN: [id number]  Date: 05/03/2019  DOB: 10-Dec-1975  BS:JGGEZMO, [name], MD   REFERRING PHYSICIAN: Fanny Skates, MD  DIAGNOSIS:    ICD-10-CM   1. Malignant neoplasm of lower-outer quadrant of right breast of female, estrogen receptor positive ([place])  C50.511    Z17.0    Cancer Staging Malignant neoplasm of lower-outer quadrant of right breast of female, estrogen receptor positive ([place]) Staging form: Breast, AJCC 8th Edition - Clinical stage from 05/03/2019: Stage IA (cT1c, cN0, cM0, G2, ER+, PR+, HER2-) - Signed by Nicholas Lose, MD on 05/03/2019   CHIEF COMPLAINT: Here to discuss management of right breast cancer  HISTORY OF PRESENT ILLNESS::Holly Jenkins is a 43 y.o. female who presented with a palpable mass in the right breast and abnormality corresponding to this on mammography.  Ultrasound of breast (right) revealed a 1.6 cm mass at 8:00, axillary negative.   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  She is in her usual state of health.  She is a non-smoker.  She reports chronic pain in her neck head and muscles and undergoes Botox injections in her neck.  She wears contacts for vision.  She does have some anxiety.  She has a history of interstitial cystitis.  She reports that she has headaches at times and a thyroid problem.   PREVIOUS RADIATION THERAPY: No  PAST MEDICAL HISTORY:  has a past medical history of Anxiety, Fibromyalgia, Increased thyroid stimulating hormone (TSH) level, Migraines, and Mold exposure.    PAST SURGICAL HISTORY: Past Surgical History:  Procedure Laterality Date  . CERVICAL SPINE SURGERY     c5-c6-c7 fusion with disc transplants    FAMILY  HISTORY: family history includes Diabetes in her maternal grandmother; Healthy in her mother.  SOCIAL HISTORY:  reports that she has never smoked. She has never used smokeless tobacco. She reports that she does not drink alcohol or use drugs.  ALLERGIES: Dust mite extract, Eggs or egg-derived products, Gluten meal, Thimerosal, Peanut-containing drug products, and Sulfites  MEDICATIONS:  Current Outpatient Medications  Medication Sig Dispense Refill  . AJOVY 225 MG/1.5ML SOSY INJECT 1 syringe SUBCUTANEOUSLY EVERY 30 DAYS 1.5 mL 4  . Ascorbic Acid (VITA-C PO) Take 1 tablet by mouth at bedtime.     Jolyne Loa Grape-Goldenseal (BERBERINE COMPLEX PO) Take 1 tablet by mouth daily.    . Bilberry 100 MG CAPS Take 1 capsule by mouth. Daily at night    . BLACK CURRANT SEED OIL PO Take 1 tablet by mouth 2 (two) times daily.    . botulinum toxin Type A (BOTOX) 100 units SOLR injection Inject IM every 3 months into head and neck muscles by provider in the office 2 vial 3  . clonazePAM (KLONOPIN) 0.5 MG tablet Take 0.5 mg by mouth 2 (two) times daily as needed for anxiety.    . Diclofenac Potassium (CAMBIA) 50 MG PACK MIX 1 PACKET WITH 1-2 OUNCES OF WATER. DRINK IMMEDIATELY AS A SINGLE DOSE 9 each 11  . escitalopram (LEXAPRO) 10 MG tablet Take 10 mg by mouth daily.     Marland Kitchen etonogestrel-ethinyl estradiol (NUVARING) 0.12-0.015 MG/24HR vaginal ring Insert vaginally and leave in place for 4 consecutive weeks, then replace  with new one. 3 each 4  . Melatonin-Pyridoxine (MELATIN PO) Take 1 tablet by mouth daily.    . Menaquinone-7 (VITAMIN K2 PO) Take 1 tablet by mouth daily.    . metoCLOPramide (REGLAN) 10 MG tablet Take 1 tablet (10 mg total) by mouth every 6 (six) hours as needed for nausea. May take for migraine. 30 tablet 6  . Multiple Vitamins-Minerals (ZINC PO) Take 1 tablet by mouth 2 (two) times daily before a meal.     . NON FORMULARY D-HIST  2 TABS PO QD    . NON FORMULARY Take 1 tablet by mouth.  ALA    . NON FORMULARY Take 1 tablet by mouth 2 (two) times daily before a meal. [place]    . NON FORMULARY Take 1 tablet by mouth daily. Trichromium daily with food    . NON FORMULARY Take 2 tablets by mouth every morning. A-Drenal    . NON FORMULARY Take 1 tablet by mouth 2 (two) times daily before a meal. L-Carnatine  Two times daily (morning and afternoon)    . NON FORMULARY Take 1 tablet by mouth. Methylation Complete 1 tab daily in the afternoon under the tongue    . NON FORMULARY Take 1 tablet by mouth daily. P-5-P one tablet daily in the afternoon    . NON FORMULARY Take 4 tablets by mouth. Mega IgG 2000 daily in the afternoon    . Omega-3 Fatty Acids (OMEGA 3 PO) Take 1 tablet by mouth daily.    . ondansetron (ZOFRAN) 4 MG tablet Take acutely at onset of migraine or nausea. As needed.Maximum 4x a day 20 tablet 6  . OVER THE COUNTER MEDICATION Take 1 tablet by mouth daily. Vitamin D3    . OVER THE COUNTER MEDICATION Take 1 tablet by mouth. Iron one tablet daily in the afternoon    . PHOSPHATIDYLSERINE PO Take 2 tablets by mouth 2 (two) times daily.    . pregabalin (LYRICA) 75 MG capsule Take 75 mg by mouth daily.    . Probiotic Product (PROBIOTIC PO) Take by mouth.    . propranolol (INDERAL) 10 MG tablet Take acutely for migraine 60 tablet 6  . Thyroid (NATURE-THROID PO) Take by mouth daily.    . TURMERIC PO Take by mouth daily.    Marland Kitchen VITAMIN B COMPLEX-C PO Take 1 tablet by mouth daily.     No current facility-administered medications for this encounter.     REVIEW OF SYSTEMS: A 10+ POINT REVIEW OF SYSTEMS WAS OBTAINED including neurology, dermatology, psychiatry, cardiac, respiratory, lymph, extremities, GI, GU, Musculoskeletal, constitutional, breasts, reproductive, HEENT.  All pertinent positives are noted in the HPI.  All others are negative.   PHYSICAL EXAM:  Vitals with BMI 05/03/2019  Height 5' 7.5"  Weight 142 lbs  BMI 42.6  Systolic 834  Diastolic 67  Pulse 82   Respirations 20   General: Alert and oriented, in no acute distress HEENT: Head is normocephalic.   Extremities: No cyanosis or edema. Skin: No concerning lesions. Musculoskeletal: Ambulatory Neurologic: Cranial nerves II through XII are grossly intact. No obvious focalities. Speech is fluent. Coordination is intact. Psychiatric: Judgment and insight are intact. Affect is appropriate. Breasts: At the 7 o'clock position of the right breast there is a ~1.5 cm mass. No other palpable masses appreciated in the breasts or axillae bilaterally.   ECOG = 0  0 - Asymptomatic (Fully active, able to carry on all predisease activities without restriction)  1 - Symptomatic but  completely ambulatory (Restricted in physically strenuous activity but ambulatory and able to carry out work of a light or sedentary nature. For example, light housework, office work)  2 - Symptomatic, <50% in bed during the day (Ambulatory and capable of all self care but unable to carry out any work activities. Up and about more than 50% of waking hours)  3 - Symptomatic, >50% in bed, but not bedbound (Capable of only limited self-care, confined to bed or chair 50% or more of waking hours)  4 - Bedbound (Completely disabled. Cannot carry on any self-care. Totally confined to bed or chair)  5 - Death   Eustace Pen MM, Creech RH, Tormey DC, et al. [phone]). "Toxicity and response criteria of the [facility]". [place] Oncol. 5 (6): 649-55   LABORATORY DATA:  Lab Results  Component Value Date   WBC 7.4 05/03/2019   HGB 12.7 05/03/2019   HCT 38.8 05/03/2019   MCV 87.8 05/03/2019   PLT 256 05/03/2019   CMP     Component Value Date/Time   NA 139 05/03/2019 0839   K 4.2 05/03/2019 0839   CL 105 05/03/2019 0839   CO2 24 05/03/2019 0839   GLUCOSE 98 05/03/2019 0839   BUN 14 05/03/2019 0839   CREATININE 0.73 05/03/2019 0839   CALCIUM 9.3 05/03/2019 0839   PROT 7.1 05/03/2019 0839   ALBUMIN 4.0  05/03/2019 0839   AST 13 (L) 05/03/2019 0839   ALT 11 05/03/2019 0839   ALKPHOS 39 05/03/2019 0839   BILITOT 0.4 05/03/2019 0839   GFRNONAA >60 05/03/2019 0839   GFRAA >60 05/03/2019 0839         RADIOGRAPHY: US Breast Ltd Uni Right Inc Axilla  Result Date: 04/18/2019 CLINICAL DATA:  43 year old female with palpable RIGHT breast mass identified on clinical examination. Also for baseline mammogram. EXAM: DIGITAL DIAGNOSTIC BILATERAL MAMMOGRAM WITH CAD AND TOMO ULTRASOUND RIGHT BREAST COMPARISON:  None ACR Breast Density Category d: The breast tissue is extremely dense, which lowers the sensitivity of mammography. FINDINGS: 2D/3D full field views of both breasts and a spot compression view of the RIGHT breast demonstrate no definite mass, distortion or worrisome calcifications. Mammographic images were processed with CAD. On physical exam, a firm palpable mobile mass is identified at the 8 o'clock position of the RIGHT breast 2 cm from the nipple. Targeted ultrasound is performed, showing a 1.6 x 0.8 x 1 cm oval hypoechoic mass with indistinct margins at the 8 o'clock position of the RIGHT breast 2 cm from the nipple. Internal vascular flow is noted. No abnormal RIGHT axillary lymph nodes identified. IMPRESSION: 1. 1.6 cm indeterminate mass within the LOWER OUTER RIGHT breast. Tissue sampling recommended. 2. No abnormal RIGHT axillary lymph nodes. 3. No mammographic evidence of LEFT breast malignancy. RECOMMENDATION: Ultrasound-guided RIGHT breast biopsy, which will be scheduled. I have discussed the findings and recommendations with the patient. If applicable, a reminder letter will be sent to the patient regarding the next appointment. BI-RADS CATEGORY  4: Suspicious. Electronically Signed   By: Margarette Canada M.D.   On: 04/18/2019 09:17   Mm Diag Breast Tomo Bilateral  Result Date: 04/18/2019 CLINICAL DATA:  43 year old female with palpable RIGHT breast mass identified on clinical examination. Also for  baseline mammogram. EXAM: DIGITAL DIAGNOSTIC BILATERAL MAMMOGRAM WITH CAD AND TOMO ULTRASOUND RIGHT BREAST COMPARISON:  None ACR Breast Density Category d: The breast tissue is extremely dense, which lowers the sensitivity of mammography. FINDINGS: 2D/3D full field views of  both breasts and a spot compression view of the RIGHT breast demonstrate no definite mass, distortion or worrisome calcifications. Mammographic images were processed with CAD. On physical exam, a firm palpable mobile mass is identified at the 8 o'clock position of the RIGHT breast 2 cm from the nipple. Targeted ultrasound is performed, showing a 1.6 x 0.8 x 1 cm oval hypoechoic mass with indistinct margins at the 8 o'clock position of the RIGHT breast 2 cm from the nipple. Internal vascular flow is noted. No abnormal RIGHT axillary lymph nodes identified. IMPRESSION: 1. 1.6 cm indeterminate mass within the LOWER OUTER RIGHT breast. Tissue sampling recommended. 2. No abnormal RIGHT axillary lymph nodes. 3. No mammographic evidence of LEFT breast malignancy. RECOMMENDATION: Ultrasound-guided RIGHT breast biopsy, which will be scheduled. I have discussed the findings and recommendations with the patient. If applicable, a reminder letter will be sent to the patient regarding the next appointment. BI-RADS CATEGORY  4: Suspicious. Electronically Signed   By: Margarette Canada M.D.   On: 04/18/2019 09:17   Mm Clip Placement Right  Result Date: 04/21/2019 CLINICAL DATA:  Confirmation of clip placement after ultrasound-guided core needle biopsy of an indeterminate 1.6 cm mass in the LOWER OUTER QUADRANT of the RIGHT breast at POSTERIOR depth. EXAM: DIAGNOSTIC RIGHT MAMMOGRAM POST ULTRASOUND BIOPSY COMPARISON:  Previous exam(s). FINDINGS: Mammographic images were obtained following ultrasound guided biopsy of an indeterminate mass involving the LOWER OUTER QUADRANT of the RIGHT breast at POSTERIOR depth. The ribbon shaped tissue marker clip is appropriately  positioned within the biopsied mass in the [place]. Expected post biopsy changes are present without evidence of hematoma. IMPRESSION: Appropriate positioning of the ribbon shaped tissue marker clip within the biopsied mass in the LOWER OUTER QUADRANT of the RIGHT breast at POSTERIOR depth. Final Assessment: Post Procedure Mammograms for Marker Placement Electronically Signed   By: Evangeline Dakin M.D.   On: 04/21/2019 09:59   [place] Rt Breast Bx W Loc Dev 1st Lesion Img Bx Spec US Guide  Addendum Date: 04/25/2019   ADDENDUM REPORT: 04/25/2019 13:59 ADDENDUM: Pathology revealed GRADE II INVASIVE DUCTAL CARCINOMA of the Right breast, lower outer quadrant, 8 o'clock, 2 cm fn. This was found to be concordant by Dr. Peggye Fothergill. Pathology results were discussed with the patient by telephone. The patient reported doing well after the biopsy with tenderness at the site. Post biopsy instructions and care were reviewed and questions were answered. The patient was encouraged to call [facility] for any additional concerns. The patient was referred to [facility] on May 03, 2019. Pathology results reported by Terie Purser, RN on 04/25/2019. Electronically Signed   By: Evangeline Dakin M.D.   On: 04/25/2019 13:59   Result Date: 04/25/2019 CLINICAL DATA:  Palpable indeterminate 1.6 cm mass in the LOWER OUTER QUADRANT of the RIGHT breast at far POSTERIOR depth, at the 8 o'clock position approximately 2 cm from the nipple. EXAM: ULTRASOUND GUIDED RIGHT BREAST CORE NEEDLE BIOPSY COMPARISON:  Previous exam(s). FINDINGS: I met with the patient and we discussed the procedure of ultrasound-guided biopsy, including benefits and alternatives. We discussed the high likelihood of a successful procedure. We discussed the risks of the procedure, including infection, bleeding, tissue injury, clip migration, and  inadequate sampling. Informed written consent was given. The usual time-out protocol was performed immediately prior to the procedure. Lesion quadrant: LOWER OUTER QUADRANT Using sterile technique with chlorhexidine as skin  antisepsis, 1% lidocaine and 1% lidocaine with epinephrine as local anesthetic, under direct ultrasound visualization, a 14 gauge Bard Marquee core needle device placed through a 13 gauge introducer needle was used to perform biopsy of the mass in the LOWER OUTER QUADRANT of the RIGHT breast using a LATERAL approach. At the conclusion of the procedure a ribbon shaped tissue marker clip was deployed into the biopsy cavity. Follow up 2 view mammogram was performed and dictated separately. IMPRESSION: Ultrasound guided biopsy of an indeterminate palpable mass in the LOWER OUTER QUADRANT of the RIGHT breast. No apparent complications. Electronically Signed: By: Evangeline Dakin M.D. On: 04/21/2019 09:57      IMPRESSION/PLAN: right breast cancer  She has been discussed at our multidisciplinary tumor board.  The consensus is that she would be a good candidate for breast conservation. I talked to her about the option of a mastectomy and informed her that her expected overall survival would be equivalent between mastectomy and breast conservation, based upon randomized controlled data. She is enthusiastic about breast conservation.  It was a pleasure meeting the patient today. We discussed the risks, benefits, and side effects of radiotherapy. I recommend radiotherapy to the right breast to reduce her risk of locoregional recurrence by 2/3.  We discussed that radiation would take approximately 4-6 weeks to complete and that I would give the patient a few weeks to heal following surgery before starting treatment planning.  If chemotherapy were to be given, this would precede radiotherapy. We spoke about acute effects including skin irritation and fatigue as well as much less common late effects  including internal organ injury or irritation. We spoke about the latest technology that is used to minimize the risk of late effects for patients undergoing radiotherapy to the breast or chest wall. No guarantees of treatment were given. The patient is enthusiastic about proceeding with treatment. I look forward to participating in the patient's care.  I will await her referral back to me for postoperative follow-up and eventual CT simulation/treatment planning.  She has been taking hormonal birth control.  I asked nurse navigation to verify that she be instructed to stop this and will defer to medical oncology on advice for for future safe birth control. __________________________________________   Eppie Gibson, MD

## 2019-05-09 ENCOUNTER — Other Ambulatory Visit: Payer: Self-pay

## 2019-05-09 ENCOUNTER — Ambulatory Visit (HOSPITAL_COMMUNITY)
Admission: RE | Admit: 2019-05-09 | Discharge: 2019-05-09 | Disposition: A | Discharge status: Home / Self Care | Payer: BC Managed Care – PPO | Source: Ambulatory Visit | Attending: General Surgery | Admitting: General Surgery

## 2019-05-09 ENCOUNTER — Telehealth: Payer: Self-pay | Admitting: Family Medicine

## 2019-05-09 DIAGNOSIS — C50511 Malignant neoplasm of lower-outer quadrant of right female breast: Secondary | ICD-10-CM | POA: Diagnosis present

## 2019-05-09 DIAGNOSIS — Z170 Estrogen receptor positive status [ER+]: Secondary | ICD-10-CM | POA: Insufficient documentation

## 2019-05-09 MED ORDER — GADOBUTROL 1 MMOL/ML IV SOLN
6.0000 mL | Freq: Once | INTRAVENOUS | Status: AC | PRN
  Administered 2019-05-09: 15:00:00 6 mL via INTRAVENOUS

## 2019-05-09 NOTE — Telephone Encounter (Signed)
Pt is asking for a call to discuss best medication to take for her migraines, please call

## 2019-05-10 ENCOUNTER — Encounter: Payer: Self-pay | Admitting: Licensed Clinical Social Worker

## 2019-05-10 ENCOUNTER — Ambulatory Visit: Payer: Self-pay | Admitting: Licensed Clinical Social Worker

## 2019-05-10 ENCOUNTER — Telehealth: Payer: Self-pay | Admitting: Licensed Clinical Social Worker

## 2019-05-10 DIAGNOSIS — C50511 Malignant neoplasm of lower-outer quadrant of right female breast: Secondary | ICD-10-CM

## 2019-05-10 DIAGNOSIS — Z1379 Encounter for other screening for genetic and chromosomal anomalies: Secondary | ICD-10-CM | POA: Insufficient documentation

## 2019-05-10 NOTE — Telephone Encounter (Signed)
I called pt back relayed that both Amy, NP after conferring with Dr. Jaynee Eagles that they stated that there is no evidence showing that  non hormonal options for [facility] would help headaches.  May could try progesterone only BC, but pt stated no she cannot do that.  She does have appt with GYN soon and will discuss.  Relayed that they recommend monitoring for now (for a bit).  The botox may work for her. She appreciated call back.  She will call back as needed.

## 2019-05-10 NOTE — Telephone Encounter (Signed)
Dr Jaynee Eagles, do you have any good suggestions here. I am not certain that non hormonal birth control options will treat headaches. Maybe we will need to monitor and add another preventative if needed? Any suggestions appreciated.

## 2019-05-10 NOTE — Telephone Encounter (Signed)
Sandy, please let her know that I would like to monitor her for now. With her breast cancer being estrogen receptor positive she may be able to try a progesterone only BC option like Nexplanon. There is no evidence that non hormonal BC options help with headaches. She may also want to reach out to GYN for their input as well. Let's see how her headaches due for the next little bit. Botox may be enough to carry [place] over. Have her follow up closely for worsening.

## 2019-05-10 NOTE — Telephone Encounter (Signed)
Im not sure I guess it depends on if she can take progesterone only based on her breast cancer? But I am not aware of any non-hormonal birth control methods that help with migraines. Yes, monitor and see how she does. Thank ou!

## 2019-05-10 NOTE — Telephone Encounter (Signed)
LMVM for pt to return call.   

## 2019-05-10 NOTE — Progress Notes (Signed)
HPI:  Holly Jenkins was previously seen in the [facility] due to a personal history of breast cancer and concerns regarding a hereditary predisposition to cancer. Please refer to our prior cancer genetics clinic note for more information regarding our discussion, assessment and recommendations, at the time. Holly Jenkins's recent genetic test results were disclosed to her, as were recommendations warranted by these results. These results and recommendations are discussed in more detail below.  CANCER HISTORY:  Oncology History  Malignant neoplasm of lower-outer quadrant of right breast of female, estrogen receptor positive ([place])  04/25/2019 Initial Diagnosis   Right breast mass identified on clinical examination. Mammogram showed 1.6cm indeterminate mass in the lower outer right breast, no abnormal axillary lymph nodes. 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   05/03/2019 Cancer Staging   Staging form: Breast, AJCC 8th Edition - Clinical stage from 05/03/2019: Stage IA (cT1c, cN0, cM0, G2, ER+, PR+, HER2-) - Signed by Nicholas Lose, MD on 05/03/2019    Genetic Testing   4 Variants of Uncertain Significance: VUS in BMPR1A called c.565T>C, VUS in NBN called c.724>A, VUS in NTHL1 called c.704G>A, and VUS in POLE c.1847G>A identified on the [facility] Breast Cancer STAT Panel  + Common Hereditary Cancers Panel. The STAT Breast cancer panel offered by [facility] includes sequencing and rearrangement analysis for the following 9 genes:  ATM, BRCA1, BRCA2, CDH1, CHEK2, PALB2, PTEN, STK11 and TP53.   The Common Hereditary Cancers Panel offered by [facility] includes sequencing and/or deletion duplication testing of the following 47 genes: APC, ATM, AXIN2, BARD1, BMPR1A, BRCA1, BRCA2, BRIP1, CDH1, CDKN2A (p14ARF), CDKN2A (p16INK4a), CKD4, CHEK2, CTNNA1, DICER1, EPCAM (Deletion/duplication testing only), GREM1 (promoter region deletion/duplication testing only), KIT,  MEN1, MLH1, MSH2, MSH3, MSH6, MUTYH, NBN, NF1, NHTL1, PALB2, PDGFRA, PMS2, POLD1, POLE, PTEN, RAD50, RAD51C, RAD51D, SDHB, SDHC, SDHD, SMAD4, SMARCA4. STK11, TP53, TSC1, TSC2, and VHL.  The following genes were evaluated for sequence changes only: SDHA and HOXB13 c.251G>A variant only. The report date is 05/09/2019.     FAMILY HISTORY:  We obtained a detailed, 4-generation family history.  Significant diagnoses are listed below: Family History  Problem Relation Age of Onset   Healthy Mother    Diabetes Maternal Grandmother    Ms. Pullara does not have children. She has one brother, 39, no cancer diagnoses.   Ms. Nichter's mother is living at 35, no history of cancer. The patient has 2 maternal aunts, 2 maternal uncles, no cancers. No cancers in maternal cousins. Grandmother died in her 53s, grandfather died in his 75s.   Ms. Puccio's father is living at 47, no history of cancer. The patient has 1 paternal aunt, no cancers. No cancers in paternal cousin. She does not have information about her paternal grandparents but they are deceased.  Ms. Althoff is unaware of previous family history of genetic testing for hereditary cancer risks. Patient's maternal ancestors are of [place] descent, and paternal ancestors are of [place] descent. There is no reported Ashkenazi Jewish ancestry. There is no known consanguinity.   GENETIC TEST RESULTS: Genetic testing reported out on 05/09/2019 through the [facility] Breast Cancer STAT Panel + Common Hereditary cancer panel found no pathogenic mutations. The STAT Breast cancer panel offered by [facility] includes sequencing and rearrangement analysis for the following 9 genes:  ATM, BRCA1, BRCA2, CDH1, CHEK2, PALB2, PTEN, STK11 and TP53. The Common Hereditary Cancers Panel offered by [facility] includes sequencing and/or deletion duplication testing of the following  48 genes: APC, ATM, AXIN2, BARD1, BMPR1A, BRCA1, BRCA2, BRIP1, CDH1, CDKN2A (p14ARF), CDKN2A  (p16INK4a), CKD4, CHEK2, CTNNA1, DICER1, EPCAM (Deletion/duplication testing only), GREM1 (promoter region deletion/duplication testing only), KIT, MEN1, MLH1, MSH2, MSH3, MSH6, MUTYH, NBN, NF1, NHTL1, PALB2, PDGFRA, PMS2, POLD1, POLE, PTEN, RAD50, RAD51C, RAD51D, RNF43, SDHB, SDHC, SDHD, SMAD4, SMARCA4. STK11, TP53, TSC1, TSC2, and VHL.  The following genes were evaluated for sequence changes only: SDHA and HOXB13 c.251G>A variant only.. The test report has been scanned into EPIC and is located under the Molecular Pathology section of the Results Review tab.  A portion of the result report is included below for reference.    We discussed with Ms. Garis that because current genetic testing is not perfect, it is possible there may be a gene mutation in one of these genes that current testing cannot detect, but that chance is small.  We also discussed, that there could be another gene that has not yet been discovered, or that we have not yet tested, that is responsible for the cancer diagnoses in the family. It is also possible there is a hereditary cause for the cancer in the family that Ms. Bertoni did not inherit and therefore was not identified in her testing.  Therefore, it is important to remain in touch with cancer genetics in the future so that we can continue to offer Ms. Basquez the most up to date genetic testing.   Genetic testing did identify 4 Variants of uncertain significance (VUS) - one in the BMPR1A gene called c.565T>C, a second in the NBN gene called c.724G>A, a third in the [place] gene called c.704G>A, and a fourth in the POLE gene called c.1847G>A.  At this time, it is unknown if these variants are associated with increased cancer risk or if they are normal findings, but most variants such as these get reclassified to being inconsequential. They should not be used to make medical management decisions. With time, we suspect the lab will determine the significance of these variants, if  any. If we do learn more about them, we will try to contact Ms. Corman to discuss it further. However, it is important to stay in touch with [place] periodically and keep the address and phone number up to date.  ADDITIONAL GENETIC TESTING: We discussed with Ms. Mcmichen that her genetic testing was fairly extensive.  If there are genes identified to increase cancer risk that can be analyzed in the future, we would be happy to discuss and coordinate this testing at that time.    CANCER SCREENING RECOMMENDATIONS: Ms. Kohen's test result is considered negative (normal).  This means that we have not identified a hereditary cause for her  personal  history of cancer at this time. Most cancers happen by chance and this negative test suggests that her cancer may fall into this category.    While reassuring, this does not definitively rule out a hereditary predisposition to cancer. It is still possible that there could be genetic mutations that are undetectable by current technology. There could be genetic mutations in genes that have not been tested or identified to increase cancer risk.  Therefore, it is recommended she continue to follow the cancer management and screening guidelines provided by her oncology and primary healthcare provider.   An individual's cancer risk and medical management are not determined by genetic test results alone. Overall cancer risk assessment incorporates additional factors, including personal medical history, family history, and any available genetic information that may result in a personalized  plan for cancer prevention and surveillance  RECOMMENDATIONS FOR FAMILY MEMBERS:  Relatives in this family might be at some increased risk of developing cancer, over the general population risk, simply due to the family history of cancer.  We recommended female relatives in this family have a yearly mammogram beginning at age 32, or 31 years younger than the earliest onset of cancer, an  annual clinical breast exam, and perform monthly breast self-exams. Female relatives in this family should also have a gynecological exam as recommended by their primary provider. All family members should have a colonoscopy by age 25, or as directed by their physicians.  FOLLOW-UP: Lastly, we discussed with Ms. Flock that cancer genetics is a rapidly advancing field and it is possible that new genetic tests will be appropriate for her and/or her family members in the future. We encouraged her to remain in contact with cancer genetics on an annual basis so we can update her personal and family histories and let her know of advances in cancer genetics that may benefit this family.   Our contact number was provided. Ms. Bobo's questions were answered to her satisfaction, and she knows she is welcome to call us at anytime with additional questions or concerns.   Faith Rogue, MS, [facility] [email or web address] Phone: [phone]

## 2019-05-10 NOTE — Telephone Encounter (Signed)
Pt returned call.  She has Breast Cancer (her type she is not able to take hormones).  She will be starting tamoxifen in 2 months.  Her birth control (the hormones) helped her migraines.  She is asking what are birth control options (for her relating to her migraines)   Hysterectomy? Copper IUD?  She said BOTOX is working very well.  She is amenable for a appt if needed.  Please advise.  Last seen 4/20, has annual appt.

## 2019-05-10 NOTE — Telephone Encounter (Signed)
Revealed negative genetic testing.  Revealed that 4 variants of uncertain significance (VUS) were identified: one in the BMPR1A gene, one in the NBN gene, one in the [place] gene, and one in the POLE gene. This normal result is reassuring and indicates that it is unlikely Holly Jenkins's cancer is due to a hereditary cause.  It is unlikely that there is an increased risk of another cancer due to a mutation in one of these genes.  However, genetic testing is not perfect, and cannot definitively rule out a hereditary cause.  It will be important for her to keep in contact with genetics to learn if any additional testing may be needed in the future.

## 2019-05-11 ENCOUNTER — Other Ambulatory Visit: Payer: Self-pay | Admitting: General Surgery

## 2019-05-11 ENCOUNTER — Telehealth: Payer: Self-pay | Admitting: *Deleted

## 2019-05-11 DIAGNOSIS — C50511 Malignant neoplasm of lower-outer quadrant of right female breast: Secondary | ICD-10-CM

## 2019-05-11 NOTE — Telephone Encounter (Signed)
Left vm regarding BMDC from 7.22.20. Contact information provided for questions or needs. Physician team notified of negative genetics and MRI results.

## 2019-05-12 ENCOUNTER — Other Ambulatory Visit: Payer: Self-pay

## 2019-05-15 ENCOUNTER — Ambulatory Visit (INDEPENDENT_AMBULATORY_CARE_PROVIDER_SITE_OTHER): Payer: BC Managed Care – PPO | Admitting: Women's Health

## 2019-05-15 ENCOUNTER — Encounter: Payer: Self-pay | Admitting: Women's Health

## 2019-05-15 ENCOUNTER — Other Ambulatory Visit: Payer: Self-pay

## 2019-05-15 VITALS — BP 110/74

## 2019-05-15 DIAGNOSIS — Z3009 Encounter for other general counseling and advice on contraception: Secondary | ICD-10-CM

## 2019-05-15 NOTE — Patient Instructions (Addendum)
Stop nuva ring  Vasectomy at urology office  Ablation Endometrial ablation is a procedure that destroys the thin inner layer of the lining of the uterus (endometrium). This procedure may be done:  To stop heavy periods.  To stop bleeding that is causing anemia.  To control irregular bleeding.  To treat bleeding caused by small tumors (fibroids) in the endometrium. This procedure is often an alternative to major surgery, such as removal of the uterus and cervix (hysterectomy). As a result of this procedure:  You may not be able to have children. However, if you are premenopausal (you have not gone through menopause): ? You may still have a small chance of getting pregnant. ? You will need to use a reliable method of birth control after the procedure to prevent pregnancy.  You may stop having a menstrual period, or you may have only a small amount of bleeding during your period. Menstruation may return several years after the procedure. Tell a health care provider about:  Any allergies you have.  All medicines you are taking, including vitamins, herbs, eye drops, creams, and over-the-counter medicines.  Any problems you or family members have had with the use of anesthetic medicines.  Any blood disorders you have.  Any surgeries you have had.  Any medical conditions you have. What are the risks? Generally, this is a safe procedure. However, problems may occur, including:  A hole (perforation) in the uterus or bowel.  Infection of the uterus, bladder, or vagina.  Bleeding.  Damage to other structures or organs.  An air bubble in the lung (air embolus).  Problems with pregnancy after the procedure.  Failure of the procedure.  Decreased ability to diagnose cancer in the endometrium. What happens before the procedure?  You will have tests of your endometrium to make sure there are no pre-cancerous cells or cancer cells present.  You may have an ultrasound of the  uterus.  You may be given medicines to thin the endometrium.  Ask your health care provider about: ? Changing or stopping your regular medicines. This is especially important if you take diabetes medicines or blood thinners. ? Taking medicines such as aspirin and ibuprofen. These medicines can thin your blood. Do not take these medicines before your procedure if your doctor tells you not to.  Plan to have someone take you home from the hospital or clinic. What happens during the procedure?   You will lie on an exam table with your feet and legs supported as in a pelvic exam.  To lower your risk of infection: ? Your health care team will wash or sanitize their hands and put on germ-free (sterile) gloves. ? Your genital area will be washed with soap.  An IV tube will be inserted into one of your veins.  You will be given a medicine to help you relax (sedative).  A surgical instrument with a light and camera (resectoscope) will be inserted into your vagina and moved into your uterus. This allows your surgeon to see inside your uterus.  Endometrial tissue will be removed using one of the following methods: ? Radiofrequency. This method uses a radiofrequency-alternating electric current to remove the endometrium. ? Cryotherapy. This method uses extreme cold to freeze the endometrium. ? Heated-free liquid. This method uses a heated saltwater (saline) solution to remove the endometrium. ? Microwave. This method uses high-energy microwaves to heat up the endometrium and remove it. ? Thermal balloon. This method involves inserting a catheter with a balloon tip into  the uterus. The balloon tip is filled with heated fluid to remove the endometrium. The procedure may vary among health care providers and hospitals. What happens after the procedure?  Your blood pressure, heart rate, breathing rate, and blood oxygen level will be monitored until the medicines you were given have worn off.  As  tissue healing occurs, you may notice vaginal bleeding for 4-6 weeks after the procedure. You may also experience: ? Cramps. ? Thin, watery vaginal discharge that is light pink or brown in color. ? A need to urinate more frequently than usual. ? Nausea.  Do not drive for 24 hours if you were given a sedative.  Do not have sex or insert anything into your vagina until your health care provider approves. Summary  Endometrial ablation is done to treat the many causes of heavy menstrual bleeding.  The procedure may be done only after medications have been tried to control the bleeding.  Plan to have someone take you home from the hospital or clinic. This information is not intended to replace advice given to you by your health care provider. Make sure you discuss any questions you have with your health care provider. Document Released: 08/07/2004 Document Revised: 03/15/2018 Document Reviewed: 10/15/2016 Elsevier Patient Education  2020 [employer].

## 2019-05-15 NOTE — Progress Notes (Signed)
43 year old MWF G0 presents to discuss contraception.  Had been amenorrheic on continuous NuvaRing for menstrual migraine prevention.  Long history of migraines without aura, which is biggest health concern when coming off of NuvaRing to have breast cancer surgery/treatment.  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  Scheduled for lumpectomy 05/17/2019, 1 month for healing and then 4 to 6 weeks of radiation depending on lymph node finding.  Also planning 10-year tamoxifen treatment.  Other problems include  Fibromyalgia, anxiety/depression.  Works in the [employer].  Exam:  Appears well.   Contraception management with Breast cancer ER/PR pos Migraines  Plan: non hormonal options reviewed.  Discussed IUD versus vasectomy, which husband is willing to do.  Reviewed vasectomy is done at the urologist office, not immediately effective.  Stop NuvaRing now and use condoms until negative sperm count.  Questions answered.  Has seen a neurologist in relationship to migraines and has medicines to treat.  Reviewed if cycles become heavy, problematic endometrial ablation could be an option in the future.

## 2019-05-16 ENCOUNTER — Encounter (HOSPITAL_BASED_OUTPATIENT_CLINIC_OR_DEPARTMENT_OTHER): Payer: Self-pay | Admitting: *Deleted

## 2019-05-17 ENCOUNTER — Telehealth: Payer: Self-pay | Admitting: Hematology and Oncology

## 2019-05-17 NOTE — Telephone Encounter (Signed)
Scheduled appt per 8/4 sch message - pt aware of appt date and time   

## 2019-05-20 ENCOUNTER — Other Ambulatory Visit (HOSPITAL_COMMUNITY)
Admission: RE | Admit: 2019-05-20 | Discharge: 2019-05-20 | Disposition: A | Discharge status: Home / Self Care | Payer: BC Managed Care – PPO | Source: Ambulatory Visit | Attending: General Surgery | Admitting: General Surgery

## 2019-05-20 DIAGNOSIS — Z01812 Encounter for preprocedural laboratory examination: Secondary | ICD-10-CM | POA: Insufficient documentation

## 2019-05-20 DIAGNOSIS — Z20828 Contact with and (suspected) exposure to other viral communicable diseases: Secondary | ICD-10-CM | POA: Diagnosis not present

## 2019-05-21 LAB — SARS CORONAVIRUS 2 (TAT 6-24 HRS): SARS Coronavirus 2: NEGATIVE

## 2019-05-21 NOTE — H&P (Signed)
Holly Jenkins Location: [facility] Patient #: [id number] DOB: 1976-06-01 Undefined / Language: Undefined / Race: Refused to Report/Unreported Female      History of Present Illness     This is a 43 year old female seen in the [place] today by Dr. Lindi Adie, Dr. Isidore Moos, and me. She was referred by Dr. Evangeline Dakin at the [facility] for evaluation of invasive ductal carcinoma right breast, lower outer quadrant, receptor positive. Elon Alas, PA is her gynecologic provider. Marda Stalker is her primary care provider. A nursing chaperone was present with me throughout the encounter. Her husband was present by phone intermittently.      Her gynecologic provider felt a small mass in the right breast at the 8 o'clock position. Imaging studies show that the breasts are extremely dense, homogenously, category D. Mass was not seen. This was her baseline mammogram. Ultrasound showed a 1.6 cm mass in the right breast, 8 clock position, 2 cm from the nipple. Right axillary ultrasound negative. Left breast normal. Image guided biopsy shows grade 2 invasive ductal carcinoma, ER and PR strongly positive at 100%. Ki-67 2%. HER-2 negative. She had no bruising or hematoma      Past history reveals cervical spine surgery. Migraine headaches. Fibromyalgia. Hypothyroidism. Basically healthy. Works out daily. Family history reveals mother and father are living and well. No family history of any cancer syndromes Social history reveals she is married. They live in [place]. No children. Very healthy and works out daily. Denies alcohol or tobacco. She is a Doctor, general practice in a private lab.      We had a very long discussion. One hour. I explained the nature and size of her tumor. I told her how difficult it was to evaluate her breast by mammograms and that we would need to do an MRI before we made final surgical decisions. She understands this well. We discussed  genetic testing and its influence on her personal care plan and family surveillance. She is interested in this and the blood was apparently drawn. Assuming this is a solitary tumor that she would be a good candidate for right breast lumpectomy sentinel node biopsy and radiation therapy. I compare that to mastectomy with or without reconstruction. In general she is in favor of breast conservation surgery unless the MRI shows more extensive disease I discussed the indications, details, techniques, and numerous risk of the surgery with her. She is aware of the risk of bleeding, infection, cosmetic deformity, chronic pain, arm numbness. Nerve damage, arm swelling, reoperation for positive margins or positive nodes. She understands all these issues. All of her questions are answered. She agrees with this plan.  Plan: Breast MRI scheduled for July 28 See me in the office next week after the MRI is done Tentatively plan right breast lumpectomy with RSL and right axillary sentinel lymph node biopsy. Orders have been entered in epic and posting sheet completed  hold off on surgery date until we know what the MRI shows   Addendum Note MRI shows isolated disease in the right breast. Specifically it shows 2 small masses adjacent to each other in the right breast at the 8 o'clock position spanning 1.6 cm. This correlates with the ultrasound which showed a solitary 1.6 cm mass. There were no other enhancing areas in either breast. There was no adenopathy. I called her and discussed this with her. I told her no further biopsies were necessary. She advised me that her genetic testing is negative. We discussed algorithms for  care. Her preference is to proceed with scheduling for right breast lumpectomy with radioactive seed localization and right axillary sentinel node biopsy Once again we discussed the indications techniques and risks of surgery and she is very comfortable She requested we  cancel her preop visit on August 3   The office is instructed to proceed with scheduling urgently.   Past Surgical History  Breast Biopsy  Right. Foot Surgery  Right. Oral Surgery  Spinal Surgery - Neck   Diagnostic Studies History  Colonoscopy  never Mammogram  within last year Pap Smear  1-5 years ago  Medication History  Medications Reconciled  Social History Caffeine use  Coffee, Tea. No alcohol use  No drug use  Tobacco use  Never smoker.  Family History  Depression  Family Members In General. Diabetes Mellitus  Family Members In General. Hypertension  Family Members In General. Migraine Headache  Brother.  Pregnancy / Birth History  Age at menarche  75 years. Contraceptive History  Contraceptive implant. Gravida  0 Irregular periods  Para  0  Other Problems  Anxiety Disorder  Bladder Problems  Migraine Headache  Other disease, cancer, significant illness  Thyroid Disease     Review of Systems  General Not Present- Appetite Loss, Chills, Fatigue, Fever, Night Sweats, Weight Gain and Weight Loss. Skin Not Present- Change in Wart/Mole, Dryness, Hives, Jaundice, New Lesions, Non-Healing Wounds, Rash and Ulcer. HEENT Not Present- Earache, Hearing Loss, Hoarseness, Nose Bleed, Oral Ulcers, Ringing in the Ears, Seasonal Allergies, Sinus Pain, Sore Throat, Visual Disturbances, Wears glasses/contact lenses and Yellow Eyes. Respiratory Not Present- Bloody sputum, Chronic Cough, Difficulty Breathing, Snoring and Wheezing. Breast Not Present- Breast Mass, Breast Pain, Nipple Discharge and Skin Changes. Cardiovascular Not Present- Chest Pain, Difficulty Breathing Lying Down, Leg Cramps, Palpitations, Rapid Heart Rate, Shortness of Breath and Swelling of Extremities. Gastrointestinal Not Present- Abdominal Pain, Bloating, Bloody Stool, Change in Bowel Habits, Chronic diarrhea, Constipation, Difficulty Swallowing, Excessive gas, Gets full  quickly at meals, Hemorrhoids, Indigestion, Nausea, Rectal Pain and Vomiting. Female Genitourinary Not Present- Frequency, Nocturia, Painful Urination, Pelvic Pain and Urgency. Musculoskeletal Not Present- Back Pain, Joint Pain, Joint Stiffness, Muscle Pain, Muscle Weakness and Swelling of Extremities. Neurological Not Present- Decreased Memory, Fainting, Headaches, Numbness, Seizures, Tingling, Tremor, Trouble walking and Weakness. Psychiatric Present- Anxiety. Not Present- Bipolar, Change in Sleep Pattern, Depression, Fearful and Frequent crying. Endocrine Not Present- Cold Intolerance, Excessive Hunger, Hair Changes, Heat Intolerance, Hot flashes and New Diabetes. Hematology Not Present- Blood Thinners, Easy Bruising, Excessive bleeding, Gland problems, HIV and Persistent Infections.   Physical Exam  General Mental Status-Alert. General Appearance-Consistent with stated age. Hydration-Well hydrated. Voice-Normal.  Head and Neck Head-normocephalic, atraumatic with no lesions or palpable masses. Trachea-midline. Thyroid Gland Characteristics - normal size and consistency.  Eye Eyeball - Bilateral-Extraocular movements intact. Sclera/Conjunctiva - Bilateral-No scleral icterus.  Chest and Lung Exam Chest and lung exam reveals -quiet, even and easy respiratory effort with no use of accessory muscles and on auscultation, normal breath sounds, no adventitious sounds and normal vocal resonance. Inspection Chest Wall - Normal. Back - normal.  Breast Breast - Left-Symmetric, Non Tender, No Biopsy scars, no Dimpling - Left, No Inflammation, No Lumpectomy scars, No Mastectomy scars, No Peau d' Orange. Breast - Right-Symmetric, Non Tender, No Biopsy scars, no Dimpling - Right, No Inflammation, No Lumpectomy scars, No Mastectomy scars, No Peau d' Orange. Breast Lump-No Palpable Breast Mass. Note: Breasts are medium size. A little bit lumpy. There is a 1.5 cm smooth,  mobile, dominant mass just outside the right areolar margin at the 8 o'clock position. There is no overlying skin change. There is no hematoma or ecchymoses. Other than being a little lumpy the breast are otherwise normal and there is no axillary adenopathy.   Cardiovascular Cardiovascular examination reveals -normal heart sounds, regular rate and rhythm with no murmurs and normal pedal pulses bilaterally.  Abdomen Inspection Inspection of the abdomen reveals - No Hernias. Skin - Scar - no surgical scars. Palpation/Percussion Palpation and Percussion of the abdomen reveal - Soft, Non Tender, No Rebound tenderness, No Rigidity (guarding) and No hepatosplenomegaly. Auscultation Auscultation of the abdomen reveals - Bowel sounds normal.  Neurologic Neurologic evaluation reveals -alert and oriented x 3 with no impairment of recent or remote memory. Mental Status-Normal.  Musculoskeletal Normal Exam - Left-Upper Extremity Strength Normal and Lower Extremity Strength Normal. Normal Exam - Right-Upper Extremity Strength Normal and Lower Extremity Strength Normal.  Lymphatic Head & Neck  General Head & Neck Lymphatics: Bilateral - Description - Normal. Axillary  General Axillary Region: Bilateral - Description - Normal. Tenderness - Non Tender. Femoral & Inguinal  Generalized Femoral & Inguinal Lymphatics: Bilateral - Description - Normal. Tenderness - Non Tender.    Assessment & Plan   PRIMARY CANCER OF LOWER OUTER QUADRANT OF RIGHT FEMALE BREAST (C50.511)   your gynecologic provider felt a small lump in your right breast, lower outer quadrant just outside the areolar margin Imaging studies show that for breasts are extremely dense and this mass was not well seen However, on ultrasound to 1.6 cm mass in the right breast at the 8 o'clock position, 2 cm from the nipple Ultrasound of the right axilla was normal. Imaging of the left breast was normal. Biopsy of the right  breast mass showed invasive ductal carcinoma, grade 2 Estrogen and progesterone receptors were strongly positive. HER-2/neu is negative. Proliferation index is 2% which is very low This suggests a slow-growing less aggressive tumor Nevertheless, it is an invasive tumor and has the potential to spread to lymph nodes or other parts of her body  you will be scheduled for a breast MRI to be sure that we are not missing something since your breasts are very difficult to evaluate by mammogram you'll be scheduled to see Dr. [name] next week after the MRI We discussed surgical options including lumpectomy, sentinel node biopsy, radiation therapy. We compared that to mastectomy with or without reconstruction. you seem interested in breast conservation surgery, and unless the MRI shows further disease, you are a good candidate for that  We will tentatively plan right breast lumpectomy and right axillary sentinel lymph node biopsy, as we discussed, hopefully in about 2 weeks Surgical procedure may change if we found other problems   HYPOTHYROIDISM, ADULT (E03.9)  H/O CERVICAL SPINE SURGERY (Z98.890)  HISTORY OF MIGRAINE HEADACHES (Z86.69)     Vadis Slabach M. [name], M.D., [facility] General and Minimally invasive Surgery Breast and Colorectal Surgery Office:   [phone] Pager:   [phone]

## 2019-05-22 ENCOUNTER — Encounter (HOSPITAL_BASED_OUTPATIENT_CLINIC_OR_DEPARTMENT_OTHER)
Admission: RE | Admit: 2019-05-22 | Discharge: 2019-05-22 | Disposition: A | Discharge status: Home / Self Care | Payer: BC Managed Care – PPO | Source: Ambulatory Visit | Attending: General Surgery | Admitting: General Surgery

## 2019-05-22 ENCOUNTER — Encounter: Payer: Self-pay | Admitting: *Deleted

## 2019-05-22 ENCOUNTER — Other Ambulatory Visit: Payer: Self-pay

## 2019-05-22 DIAGNOSIS — Z01812 Encounter for preprocedural laboratory examination: Secondary | ICD-10-CM | POA: Insufficient documentation

## 2019-05-22 LAB — POCT PREGNANCY, URINE: Preg Test, Ur: NEGATIVE

## 2019-05-22 NOTE — Progress Notes (Signed)
Clinical Social Work [place] Psychosocial Distress Screening [place]  Patient completed distress screening protocol and scored a 4 on the Psychosocial Distress Thermometer which indicates mild distress. Clinical Social Worker contacted patient at home after [facility] to assess for distress and other psychosocial needs. CSW left patient a message offering support and information patient of the support team and support services at [facility]. CSW provided contact information and encouraged patient to call with any questions or concerns.  ONCBCN DISTRESS SCREENING 05/22/2019  Screening Type Initial Screening  Distress experienced in past week (1-10) 4  Practical problem type Work/school  Emotional problem type Nervousness/Anxiety  Information Concerns Type Lack of info about diagnosis;Lack of info about treatment;Lack of info about complementary therapy choices;Lack of info about maintaining fitness  Physical Problem type Nausea/vomiting  Physician notified of physical symptoms Yes     Johnnye Lana, MSW, LCSW, OSW-C Clinical Social Worker [place] [phone]

## 2019-05-22 NOTE — Progress Notes (Signed)

## 2019-05-23 ENCOUNTER — Ambulatory Visit
Admission: RE | Admit: 2019-05-23 | Discharge: 2019-05-23 | Disposition: A | Discharge status: Home / Self Care | Payer: BC Managed Care – PPO | Source: Ambulatory Visit | Attending: General Surgery | Admitting: General Surgery

## 2019-05-23 ENCOUNTER — Telehealth: Payer: Self-pay | Admitting: Family Medicine

## 2019-05-23 ENCOUNTER — Other Ambulatory Visit: Payer: Self-pay | Admitting: General Surgery

## 2019-05-23 DIAGNOSIS — C50511 Malignant neoplasm of lower-outer quadrant of right female breast: Secondary | ICD-10-CM

## 2019-05-23 DIAGNOSIS — Z170 Estrogen receptor positive status [ER+]: Secondary | ICD-10-CM

## 2019-05-23 NOTE — Telephone Encounter (Signed)
Patient called billing and wanted to no what she could do about her high Co- pay . Angie printed her Itemized billing statement . I will mail to her and she will sign up for Botox savings program.

## 2019-05-24 ENCOUNTER — Encounter (HOSPITAL_COMMUNITY)
Admission: RE | Admit: 2019-05-24 | Discharge: 2019-05-24 | Disposition: A | Discharge status: Home / Self Care | Payer: BC Managed Care – PPO | Source: Ambulatory Visit | Attending: General Surgery | Admitting: General Surgery

## 2019-05-24 ENCOUNTER — Ambulatory Visit
Admission: RE | Admit: 2019-05-24 | Discharge: 2019-05-24 | Disposition: A | Discharge status: Home / Self Care | Payer: BC Managed Care – PPO | Source: Ambulatory Visit | Attending: General Surgery | Admitting: General Surgery

## 2019-05-24 ENCOUNTER — Ambulatory Visit (HOSPITAL_BASED_OUTPATIENT_CLINIC_OR_DEPARTMENT_OTHER): Payer: BC Managed Care – PPO | Admitting: Anesthesiology

## 2019-05-24 ENCOUNTER — Encounter (HOSPITAL_BASED_OUTPATIENT_CLINIC_OR_DEPARTMENT_OTHER)
Admission: RE | Disposition: A | Discharge status: Home / Self Care | Payer: Self-pay | Source: Home / Self Care | Attending: General Surgery

## 2019-05-24 ENCOUNTER — Ambulatory Visit (HOSPITAL_BASED_OUTPATIENT_CLINIC_OR_DEPARTMENT_OTHER)
Admission: RE | Admit: 2019-05-24 | Discharge: 2019-05-24 | Disposition: A | Discharge status: Home / Self Care | Payer: BC Managed Care – PPO | Attending: General Surgery | Admitting: General Surgery

## 2019-05-24 ENCOUNTER — Encounter (HOSPITAL_BASED_OUTPATIENT_CLINIC_OR_DEPARTMENT_OTHER): Payer: Self-pay

## 2019-05-24 ENCOUNTER — Other Ambulatory Visit: Payer: Self-pay

## 2019-05-24 DIAGNOSIS — Z79899 Other long term (current) drug therapy: Secondary | ICD-10-CM | POA: Insufficient documentation

## 2019-05-24 DIAGNOSIS — F419 Anxiety disorder, unspecified: Secondary | ICD-10-CM | POA: Diagnosis not present

## 2019-05-24 DIAGNOSIS — Z79890 Hormone replacement therapy (postmenopausal): Secondary | ICD-10-CM | POA: Diagnosis not present

## 2019-05-24 DIAGNOSIS — M797 Fibromyalgia: Secondary | ICD-10-CM | POA: Insufficient documentation

## 2019-05-24 DIAGNOSIS — C50511 Malignant neoplasm of lower-outer quadrant of right female breast: Secondary | ICD-10-CM

## 2019-05-24 DIAGNOSIS — E039 Hypothyroidism, unspecified: Secondary | ICD-10-CM | POA: Insufficient documentation

## 2019-05-24 DIAGNOSIS — D241 Benign neoplasm of right breast: Secondary | ICD-10-CM | POA: Diagnosis not present

## 2019-05-24 DIAGNOSIS — G43909 Migraine, unspecified, not intractable, without status migrainosus: Secondary | ICD-10-CM | POA: Insufficient documentation

## 2019-05-24 DIAGNOSIS — Z170 Estrogen receptor positive status [ER+]: Secondary | ICD-10-CM | POA: Insufficient documentation

## 2019-05-24 HISTORY — PX: BREAST LUMPECTOMY: SHX2

## 2019-05-24 HISTORY — PX: BREAST LUMPECTOMY WITH RADIOACTIVE SEED AND SENTINEL LYMPH NODE BIOPSY: SHX6550

## 2019-05-24 SURGERY — BREAST LUMPECTOMY WITH RADIOACTIVE SEED AND SENTINEL LYMPH NODE BIOPSY
Anesthesia: General | Site: Breast | Laterality: Right

## 2019-05-24 MED ORDER — GABAPENTIN 300 MG PO CAPS
300.0000 mg | ORAL_CAPSULE | ORAL | Status: AC
  Administered 2019-05-24: 300 mg via ORAL

## 2019-05-24 MED ORDER — FENTANYL CITRATE (PF) 100 MCG/2ML IJ SOLN
INTRAMUSCULAR | Status: AC
  Filled 2019-05-24: qty 2

## 2019-05-24 MED ORDER — PROPOFOL 500 MG/50ML IV EMUL
INTRAVENOUS | Status: DC | PRN
  Administered 2019-05-24: 25 ug/kg/min via INTRAVENOUS

## 2019-05-24 MED ORDER — CELECOXIB 200 MG PO CAPS
ORAL_CAPSULE | ORAL | Status: AC
  Filled 2019-05-24: qty 1

## 2019-05-24 MED ORDER — LACTATED RINGERS IV SOLN
INTRAVENOUS | Status: DC
  Administered 2019-05-24: 11:00:00 via INTRAVENOUS

## 2019-05-24 MED ORDER — PROPOFOL 10 MG/ML IV BOLUS
INTRAVENOUS | Status: DC | PRN
  Administered 2019-05-24: 50 mg via INTRAVENOUS

## 2019-05-24 MED ORDER — BUPIVACAINE-EPINEPHRINE 0.5% -1:200000 IJ SOLN
INTRAMUSCULAR | Status: DC | PRN
  Administered 2019-05-24: 16 mL

## 2019-05-24 MED ORDER — SCOPOLAMINE 1 MG/3DAYS TD PT72: 1.0000 | MEDICATED_PATCH | Freq: Once | TRANSDERMAL | Status: DC

## 2019-05-24 MED ORDER — SODIUM CHLORIDE 0.9% FLUSH: 3.0000 mL | Freq: Two times a day (BID) | INTRAVENOUS | Status: DC

## 2019-05-24 MED ORDER — TECHNETIUM TC 99M SULFUR COLLOID FILTERED
1.0000 | Freq: Once | INTRAVENOUS | Status: AC | PRN
  Administered 2019-05-24: 1 via INTRADERMAL

## 2019-05-24 MED ORDER — SODIUM CHLORIDE (PF) 0.9 % IJ SOLN
INTRAVENOUS | Status: DC | PRN
  Administered 2019-05-24: 5 mL

## 2019-05-24 MED ORDER — EPHEDRINE 5 MG/ML INJ
INTRAVENOUS | Status: AC
  Filled 2019-05-24: qty 20

## 2019-05-24 MED ORDER — LIDOCAINE HCL (CARDIAC) PF 100 MG/5ML IV SOSY
PREFILLED_SYRINGE | INTRAVENOUS | Status: DC | PRN
  Administered 2019-05-24: 30 mg via INTRAVENOUS

## 2019-05-24 MED ORDER — MIDAZOLAM HCL 2 MG/2ML IJ SOLN
1.0000 mg | INTRAMUSCULAR | Status: DC | PRN
  Administered 2019-05-24: 2 mg via INTRAVENOUS

## 2019-05-24 MED ORDER — ACETAMINOPHEN 500 MG PO TABS
ORAL_TABLET | ORAL | Status: AC
  Filled 2019-05-24: qty 2

## 2019-05-24 MED ORDER — OXYCODONE HCL 5 MG PO TABS
5.0000 mg | ORAL_TABLET | Freq: Once | ORAL | Status: AC | PRN
  Administered 2019-05-24: 5 mg via ORAL

## 2019-05-24 MED ORDER — CHLORHEXIDINE GLUCONATE CLOTH 2 % EX PADS: 6.0000 | MEDICATED_PAD | Freq: Once | CUTANEOUS | Status: DC

## 2019-05-24 MED ORDER — GABAPENTIN 300 MG PO CAPS
ORAL_CAPSULE | ORAL | Status: AC
  Filled 2019-05-24: qty 1

## 2019-05-24 MED ORDER — ROPIVACAINE HCL 5 MG/ML IJ SOLN
INTRAMUSCULAR | Status: DC | PRN
  Administered 2019-05-24: 30 mL via PERINEURAL

## 2019-05-24 MED ORDER — ACETAMINOPHEN 500 MG PO TABS
1000.0000 mg | ORAL_TABLET | ORAL | Status: AC
  Administered 2019-05-24: 1000 mg via ORAL

## 2019-05-24 MED ORDER — FENTANYL CITRATE (PF) 100 MCG/2ML IJ SOLN
50.0000 ug | INTRAMUSCULAR | Status: AC | PRN
  Administered 2019-05-24 (×4): 50 ug via INTRAVENOUS

## 2019-05-24 MED ORDER — OXYCODONE HCL 5 MG/5ML PO SOLN: 5.0000 mg | Freq: Once | ORAL | Status: AC | PRN

## 2019-05-24 MED ORDER — ONDANSETRON HCL 4 MG/2ML IJ SOLN: 4.0000 mg | Freq: Once | INTRAMUSCULAR | Status: DC | PRN

## 2019-05-24 MED ORDER — ONDANSETRON HCL 4 MG/2ML IJ SOLN
INTRAMUSCULAR | Status: DC | PRN
  Administered 2019-05-24: 4 mg via INTRAVENOUS

## 2019-05-24 MED ORDER — HYDROCODONE-ACETAMINOPHEN 5-325 MG PO TABS: 1.0000 | ORAL_TABLET | Freq: Four times a day (QID) | ORAL | 0 refills | Status: DC | PRN

## 2019-05-24 MED ORDER — FENTANYL CITRATE (PF) 100 MCG/2ML IJ SOLN: 25.0000 ug | INTRAMUSCULAR | Status: DC | PRN

## 2019-05-24 MED ORDER — CEFAZOLIN SODIUM-DEXTROSE 2-4 GM/100ML-% IV SOLN
2.0000 g | INTRAVENOUS | Status: AC
  Administered 2019-05-24: 2 g via INTRAVENOUS

## 2019-05-24 MED ORDER — MIDAZOLAM HCL 2 MG/2ML IJ SOLN
INTRAMUSCULAR | Status: AC
  Filled 2019-05-24: qty 2

## 2019-05-24 MED ORDER — CEFAZOLIN SODIUM-DEXTROSE 2-4 GM/100ML-% IV SOLN
INTRAVENOUS | Status: AC
  Filled 2019-05-24: qty 100

## 2019-05-24 MED ORDER — CELECOXIB 200 MG PO CAPS
200.0000 mg | ORAL_CAPSULE | ORAL | Status: AC
  Administered 2019-05-24: 200 mg via ORAL

## 2019-05-24 MED ORDER — DEXAMETHASONE SODIUM PHOSPHATE 4 MG/ML IJ SOLN
INTRAMUSCULAR | Status: DC | PRN
  Administered 2019-05-24: 10 mg via INTRAVENOUS

## 2019-05-24 MED ORDER — OXYCODONE HCL 5 MG PO TABS
ORAL_TABLET | ORAL | Status: AC
  Filled 2019-05-24: qty 1

## 2019-05-24 SURGICAL SUPPLY — 61 items
APPLIER CLIP 11 MED OPEN (CLIP) ×2
BINDER BREAST LRG (GAUZE/BANDAGES/DRESSINGS) ×2 IMPLANT
BINDER BREAST MEDIUM (GAUZE/BANDAGES/DRESSINGS) IMPLANT
BINDER BREAST XLRG (GAUZE/BANDAGES/DRESSINGS) IMPLANT
BINDER BREAST XXLRG (GAUZE/BANDAGES/DRESSINGS) IMPLANT
BLADE HEX COATED 2.75 (ELECTRODE) ×2 IMPLANT
BLADE SURG 15 STRL LF DISP TIS (BLADE) ×2 IMPLANT
BLADE SURG 15 STRL SS (BLADE) ×2
BNDG ELASTIC 6X5.8 VLCR STR LF (GAUZE/BANDAGES/DRESSINGS) IMPLANT
CANISTER SUCT 1200ML W/VALVE (MISCELLANEOUS) ×2 IMPLANT
CHLORAPREP W/TINT 26 (MISCELLANEOUS) ×2 IMPLANT
CLIP APPLIE 11 MED OPEN (CLIP) ×1 IMPLANT
COVER BACK TABLE REUSABLE LG (DRAPES) ×2 IMPLANT
COVER MAYO STAND REUSABLE (DRAPES) ×2 IMPLANT
COVER PROBE W GEL 5X96 (DRAPES) ×2 IMPLANT
COVER WAND RF STERILE (DRAPES) IMPLANT
DECANTER SPIKE VIAL GLASS SM (MISCELLANEOUS) IMPLANT
DERMABOND ADVANCED (GAUZE/BANDAGES/DRESSINGS) ×1
DERMABOND ADVANCED .7 DNX12 (GAUZE/BANDAGES/DRESSINGS) ×1 IMPLANT
DRAPE HALF SHEET 70X43 (DRAPES) ×2 IMPLANT
DRAPE LAPAROSCOPIC ABDOMINAL (DRAPES) ×2 IMPLANT
DRAPE UTILITY XL STRL (DRAPES) ×2 IMPLANT
DRSG PAD ABDOMINAL 8X10 ST (GAUZE/BANDAGES/DRESSINGS) ×2 IMPLANT
ELECT REM PT RETURN 9FT ADLT (ELECTROSURGICAL) ×2
ELECTRODE REM PT RTRN 9FT ADLT (ELECTROSURGICAL) ×1 IMPLANT
GAUZE SPONGE 4X4 12PLY STRL (GAUZE/BANDAGES/DRESSINGS) ×2 IMPLANT
GAUZE SPONGE 4X4 12PLY STRL LF (GAUZE/BANDAGES/DRESSINGS) IMPLANT
GLOVE BIOGEL PI IND STRL 7.5 (GLOVE) ×1 IMPLANT
GLOVE BIOGEL PI INDICATOR 7.5 (GLOVE) ×1
GLOVE EUDERMIC 7 POWDERFREE (GLOVE) ×2 IMPLANT
GLOVE EXAM NITRILE MD LF STRL (GLOVE) ×2 IMPLANT
GLOVE SURG SS PI 7.5 STRL IVOR (GLOVE) ×2 IMPLANT
GOWN STRL REUS W/ TWL LRG LVL3 (GOWN DISPOSABLE) IMPLANT
GOWN STRL REUS W/ TWL XL LVL3 (GOWN DISPOSABLE) ×2 IMPLANT
GOWN STRL REUS W/TWL LRG LVL3 (GOWN DISPOSABLE)
GOWN STRL REUS W/TWL XL LVL3 (GOWN DISPOSABLE) ×2
ILLUMINATOR WAVEGUIDE N/F (MISCELLANEOUS) IMPLANT
KIT MARKER MARGIN INK (KITS) ×2 IMPLANT
LIGHT WAVEGUIDE WIDE FLAT (MISCELLANEOUS) IMPLANT
NDL SAFETY ECLIPSE 18X1.5 (NEEDLE) IMPLANT
NEEDLE HYPO 18GX1.5 SHARP (NEEDLE)
NEEDLE HYPO 25X1 1.5 SAFETY (NEEDLE) ×4 IMPLANT
NS IRRIG 1000ML POUR BTL (IV SOLUTION) ×2 IMPLANT
PACK BASIN DAY SURGERY FS (CUSTOM PROCEDURE TRAY) ×2 IMPLANT
PAD ALCOHOL SWAB (MISCELLANEOUS) ×2 IMPLANT
PENCIL BUTTON HOLSTER BLD 10FT (ELECTRODE) ×2 IMPLANT
SLEEVE SCD COMPRESS KNEE MED (MISCELLANEOUS) ×2 IMPLANT
SPONGE LAP 18X18 RF (DISPOSABLE) IMPLANT
SPONGE LAP 4X18 RFD (DISPOSABLE) ×2 IMPLANT
SUT MNCRL AB 4-0 PS2 18 (SUTURE) ×4 IMPLANT
SUT SILK 2 0 SH (SUTURE) ×2 IMPLANT
SUT VIC AB 2-0 CT1 27 (SUTURE)
SUT VIC AB 2-0 CT1 TAPERPNT 27 (SUTURE) IMPLANT
SUT VIC AB 3-0 SH 27 (SUTURE)
SUT VIC AB 3-0 SH 27X BRD (SUTURE) IMPLANT
SUT VICRYL 3-0 CR8 SH (SUTURE) ×2 IMPLANT
SYR 10ML LL (SYRINGE) ×4 IMPLANT
TOWEL GREEN STERILE FF (TOWEL DISPOSABLE) ×2 IMPLANT
TRAY FAXITRON CT DISP (TRAY / TRAY PROCEDURE) ×2 IMPLANT
TUBE CONNECTING 20X1/4 (TUBING) ×2 IMPLANT
YANKAUER SUCT BULB TIP NO VENT (SUCTIONS) ×2 IMPLANT

## 2019-05-24 NOTE — Anesthesia Postprocedure Evaluation (Signed)
Anesthesia Post Note  Patient: Holly Jenkins  Procedure(s) Performed: RIGHT BREAST LUMPECTOMY WITH RADIOACTIVE SEED AND RIGHT AXILLARY DEEP SENTINEL LYMPH NODE BIOPSY, INJECT BLUE DYE (Right Breast)     Patient location during evaluation: PACU Anesthesia Type: General Level of consciousness: awake and alert Pain management: pain level controlled Vital Signs Assessment: post-procedure vital signs reviewed and stable Respiratory status: spontaneous breathing, nonlabored ventilation and respiratory function stable Cardiovascular status: blood pressure returned to baseline and stable Postop Assessment: no apparent nausea or vomiting Anesthetic complications: no    Last Vitals:  Vitals:   05/24/19 1345 05/24/19 1400  BP: 122/74 122/81  Pulse: 89 89  Resp: 19 20  Temp:    SpO2: 100% 100%    Last Pain:  Vitals:   05/24/19 1400  TempSrc:   PainSc: 4                  Lidia Collum

## 2019-05-24 NOTE — Anesthesia Procedure Notes (Signed)
Procedure Name: LMA Insertion Date/Time: 05/24/2019 11:57 AM Performed by: Signe Colt, CRNA Pre-anesthesia Checklist: Patient identified, Emergency Drugs available, Suction available and Patient being monitored Patient Re-evaluated:Patient Re-evaluated prior to induction Oxygen Delivery Method: Circle system utilized Preoxygenation: Pre-oxygenation with 100% oxygen Induction Type: IV induction Ventilation: Mask ventilation without difficulty LMA: LMA inserted LMA Size: 4.0 Number of attempts: 1 Airway Equipment and Method: Bite block Placement Confirmation: positive ETCO2 Tube secured with: Tape Dental Injury: Teeth and Oropharynx as per pre-operative assessment

## 2019-05-24 NOTE — Interval H&P Note (Signed)
History and Physical Interval Note:  05/24/2019 11:57 AM  Holly Jenkins  has presented today for surgery, with the diagnosis of INVASIVE RIGHT BREAST CANCER, ESTROGEN RECEPTOR POSITIVE.  The various methods of treatment have been discussed with the patient and family. After consideration of risks, benefits and other options for treatment, the patient has consented to  Procedure(s): RIGHT BREAST LUMPECTOMY WITH RADIOACTIVE SEED AND RIGHT AXILLARY DEEP SENTINEL LYMPH NODE BIOPSY INJECT BLUE DYE (Right) as a surgical intervention.  The patient's history has been reviewed, patient examined, no change in status, stable for surgery.  I have reviewed the patient's chart and labs.  Questions were answered to the patient's satisfaction.     Adin Hector

## 2019-05-24 NOTE — Progress Notes (Signed)
Assisted Eddie, nuc med tech, with nuc med injections. Side rails up, monitors on throughout procedure. Emotional support given. See vital signs in flow sheet. Tolerated Procedure well.

## 2019-05-24 NOTE — Op Note (Signed)
Patient Name:           Holly Jenkins   Date of Surgery:        05/24/2019  Pre op Diagnosis:      Invasive ductal carcinoma right breast, central retroareolar, posterior position, receptor positive  Post op Diagnosis:    Same  Procedure:                 Inject blue dye right breast, right breast lumpectomy with radioactive seed localization, right axillary deep sentinel lymph node biopsy  Surgeon:                     Edsel Petrin. [name], M.D., FACS  Assistant:                      OR staff  Operative Indications:  This is a 43 year old female seen in the [place] recently by Dr. Lindi Adie, Dr. Isidore Moos, and me. She was referred by Dr. Evangeline Dakin at the [facility] for evaluation of invasive ductal carcinoma right breast, lower outer quadrant, receptor positive. Elon Alas, PA is her gynecologic provider. Marda Stalker is her primary care provider. A nursing chaperone was present with me throughout the encounter. Her husband was present by phone intermittently.      Her gynecologic provider felt a small mass in the right breast at the 8 o'clock position. Imaging studies show that the breasts are extremely dense, homogenously, category D. Mass was not seen. This was her baseline mammogram. Ultrasound showed a 1.6 cm mass in the right breast, 8 clock position, 2 cm from the nipple. Right axillary ultrasound negative. Left breast normal. Image guided biopsy shows grade 2 invasive ductal carcinoma, ER and PR strongly positive at 100%. Ki-67 2%. HER-2 negative. She had no bruising or hematoma    Because of breast density MRI was performed this showed 2 small masses immediately adjacent to each other at the 8 o'clock position of the right breast.  No other disease was found elsewhere. . No family history of any cancer syndromes      We had a very long discussion.  She was interested in genetic testing and blood was drawn for that. Since this is a solitary tumor that she would be a good  candidate for right breast lumpectomy sentinel node biopsy and radiation therapy. I compare that to mastectomy with or without reconstruction. In general she is in favor of breast conservation surgery. I discussed the indications, details, techniques, and numerous risk of the surgery with her.  She agrees with this plan.  Operative Findings:       The tumor was in the posterior third of the right breast, immediately behind the lower areolar margin.  Her breasts are relatively small and thin.  This was very central in location and this allowed [place] to use a circumareolar incision, hidden scar technique.  The tumor was palpable and somewhat posterior.  The broad posterior margin of the lumpectomy specimen is the pectoralis muscle. The specimen mammogram looked good, suggesting the radioactive seed and the marker clip in the center of the specimen. I found 3 distinct sentinel nodes and then there was a fourth palpable node which I removed and labeled additional sentinel node.  Procedure in Detail:          Following the induction of general LMA anesthesia a surgical timeout was performed and intravenous antibiotics were given.  Following alcohol prep I injected 5 cc of dilute methylene  blue into the right breast retroareolar area and massaged the breast for a few minutes.  The entire right chest wall and axilla and arm were then prepped and draped in a sterile fashion.      Using the neoprobe I mapped out the cancer in the right breast.  I made a circumareolar incision at the areolar margin inferiorly.  The lumpectomy was performed using the neoprobe and electrocautery.  The specimen was removed and marked with silk sutures and a 6 color ink kit.  The specimen mammogram looked good as described above.  The specimen was sent to the lab where the seed was retrieved.  Hemostasis was was excellent.  The wound was irrigated.  5 metal marker clips were placed in the walls of the lumpectomy cavity.  The lumpectomy  wound was closed in layers with interrupted 3-0 Vicryl and the skin closed with a running subcuticular 4-0 Monocryl and Dermabond.    A transverse incision was made just below the hairline in the right axilla.  Dissection was carried down through the clavipectoral fascia.  We entered the axillary space.  Using the neoprobe on the technetium setting I found 3 distinct sentinel nodes which were very blue and hot.  These were sent individually.  I found a small palpable node up in level 2 behind the pectoralis muscle and removed that and sent it to the lab as well.  No other lymph nodes were removed.  The wound was irrigated.  Hemostasis was excellent.  The clavipectoral fascia was closed with 3-0 Vicryl sutures and the skin closed with running subcuticular 4-0 Monocryl and Dermabond.  Dry bandages and a breast binder were placed.  The patient tolerated the procedure well and was taken to PACU in stable condition.  EBL 20 cc or less.  Counts correct.  Complications none.    Addendum: I logged onto the PMP aware website and reviewed her prescription medication history.     Edsel Petrin. [name], M.D., FACS General and Minimally Invasive Surgery Breast and Colorectal Surgery  05/24/2019 1:14 PM

## 2019-05-24 NOTE — Discharge Instructions (Signed)
[place] Office Phone Number [phone]  BREAST BIOPSY/ PARTIAL MASTECTOMY: POST OP INSTRUCTIONS  Always review your discharge instruction sheet given to you by the facility where your surgery was performed.  IF YOU HAVE DISABILITY OR FAMILY LEAVE FORMS, YOU MUST BRING THEM TO THE OFFICE FOR PROCESSING.  DO NOT GIVE THEM TO YOUR DOCTOR.  1. A prescription for pain medication may be given to you upon discharge.  Take your pain medication as prescribed, if needed.  If narcotic pain medicine is not needed, then you may take acetaminophen (Tylenol) or ibuprofen (Advil) as needed. 2. Take your usually prescribed medications unless otherwise directed 3. If you need a refill on your pain medication, please contact your pharmacy.  They will contact our office to request authorization.  Prescriptions will not be filled after 5pm or on week-ends. 4. You should eat very light the first 24 hours after surgery, such as soup, crackers, pudding, etc.  Resume your normal diet the day after surgery. 5. Most patients will experience some swelling and bruising in the breast.  Ice packs and a good support bra will help.  Swelling and bruising can take several days to resolve.  6. It is common to experience some constipation if taking pain medication after surgery.  Increasing fluid intake and taking a stool softener will usually help or prevent this problem from occurring.  A mild laxative (Milk of Magnesia or Miralax) should be taken according to package directions if there are no bowel movements after 48 hours. 7. Unless discharge instructions indicate otherwise, you may remove your bandages 24-48 hours after surgery, and you may shower at that time.  You may have steri-strips (small skin tapes) in place directly over the incision.  These strips should be left on the skin for 7-10 days.  If your surgeon used skin glue on the incision, you may shower in 24 hours.  The glue will flake off over the  next 2-3 weeks.  Any sutures or staples will be removed at the office during your follow-up visit. 8. ACTIVITIES:  You may resume regular daily activities (gradually increasing) beginning the next day.  Wearing a good support bra or sports bra minimizes pain and swelling.  You may have sexual intercourse when it is comfortable. a. You may drive when you no longer are taking prescription pain medication, you can comfortably wear a seatbelt, and you can safely maneuver your car and apply brakes. b. RETURN TO WORK:  ______________________________________________________________________________________ 9. You should see your doctor in the office for a follow-up appointment approximately two weeks after your surgery.  Your doctors nurse will typically make your follow-up appointment when she calls you with your pathology report.  Expect your pathology report 2-3 business days after your surgery.  You may call to check if you do not hear from [place] after three days. 10. OTHER INSTRUCTIONS: _______________________________________________________________________________________________ _____________________________________________________________________________________________________________________________________ _____________________________________________________________________________________________________________________________________ _____________________________________________________________________________________________________________________________________  WHEN TO CALL YOUR DOCTOR: 1. Fever over 101.0 2. Nausea and/or vomiting. 3. Extreme swelling or bruising. 4. Continued bleeding from incision. 5. Increased pain, redness, or drainage from the incision.  The clinic staff is available to answer your questions during regular business hours.  Please dont hesitate to call and ask to speak to one of the nurses for clinical concerns.  If you have a medical emergency, go to the nearest  emergency room or call 911.  A surgeon from [facility] is always on call at the hospital.  For further questions, please visit [email or web address]  Managing Your Pain After Surgery Without Opioids    Thank you for participating in our program to help patients manage their pain after surgery without opioids. This is part of our effort to provide you with the best care possible, without exposing you or your family to the risk that opioids pose.  What pain can I expect after surgery? You can expect to have some pain after surgery. This is normal. The pain is typically worse the day after surgery, and quickly begins to get better. Many studies have found that many patients are able to manage their pain after surgery with Over-the-Counter (OTC) medications such as Tylenol and Motrin. If you have a condition that does not allow you to take Tylenol or Motrin, notify your surgical team.  How will I manage my pain? The best strategy for controlling your pain after surgery is around the clock pain control with Tylenol (acetaminophen) and Motrin (ibuprofen or Advil). Alternating these medications with each other allows you to maximize your pain control. In addition to Tylenol and Motrin, you can use heating pads or ice packs on your incisions to help reduce your pain.  How will I alternate your regular strength over-the-counter pain medication? You will take a dose of pain medication every three hours. ; Start by taking 650 mg of Tylenol (2 pills of 325 mg) ; 3 hours later take 600 mg of Motrin (3 pills of 200 mg) ; 3 hours after taking the Motrin take 650 mg of Tylenol ; 3 hours after that take 600 mg of Motrin.   - 1 -  See example - if your first dose of Tylenol is at 12:00 PM   12:00 PM Tylenol 650 mg (2 pills of 325 mg)  3:00 PM Motrin 600 mg (3 pills of 200 mg)  6:00 PM Tylenol 650 mg (2 pills of 325 mg)  9:00 PM Motrin 600 mg (3 pills of  200 mg)  Continue alternating every 3 hours   We recommend that you follow this schedule around-the-clock for at least 3 days after surgery, or until you feel that it is no longer needed. Use the table on the last page of this handout to keep track of the medications you are taking. Important: Do not take more than 3033m of Tylenol or 32012mof Motrin in a 24-hour period. Do not take ibuprofen/Motrin if you have a history of bleeding stomach ulcers, severe kidney disease, &/or actively taking a blood thinner  What if I still have pain? If you have pain that is not controlled with the over-the-counter pain medications (Tylenol and Motrin or Advil) you might have what we call breakthrough pain. You will receive a prescription for a small amount of an opioid pain medication such as Oxycodone, Tramadol, or Tylenol with Codeine. Use these opioid pills in the first 24 hours after surgery if you have breakthrough pain. Do not take more than 1 pill every 4-6 hours.  If you still have uncontrolled pain after using all opioid pills, don't hesitate to call our staff using the number provided. We will help make sure you are managing your pain in the best way possible, and if necessary, we can provide a prescription for additional pain medication.   Day 1    Time  Name of Medication Number of pills taken  Amount of Acetaminophen  Pain Level   Comments  AM PM       AM PM       AM PM  AM PM       AM PM       AM PM       AM PM       AM PM       Total Daily amount of Acetaminophen Do not take more than  3,000 mg per day      Day 2    Time  Name of Medication Number of pills taken  Amount of Acetaminophen  Pain Level   Comments  AM PM       AM PM       AM PM       AM PM       AM PM       AM PM       AM PM       AM PM       Total Daily amount of Acetaminophen Do not take more than  3,000 mg per day      Day 3    Time  Name of Medication Number of pills taken  Amount of  Acetaminophen  Pain Level   Comments  AM PM       AM PM       AM PM       AM PM          AM PM       AM PM       AM PM       AM PM       Total Daily amount of Acetaminophen Do not take more than  3,000 mg per day      Day 4    Time  Name of Medication Number of pills taken  Amount of Acetaminophen  Pain Level   Comments  AM PM       AM PM       AM PM       AM PM       AM PM       AM PM       AM PM       AM PM       Total Daily amount of Acetaminophen Do not take more than  3,000 mg per day      Day 5    Time  Name of Medication Number of pills taken  Amount of Acetaminophen  Pain Level   Comments  AM PM       AM PM       AM PM       AM PM       AM PM       AM PM       AM PM       AM PM       Total Daily amount of Acetaminophen Do not take more than  3,000 mg per day       Day 6    Time  Name of Medication Number of pills taken  Amount of Acetaminophen  Pain Level  Comments  AM PM       AM PM       AM PM       AM PM       AM PM       AM PM       AM PM       AM PM       Total Daily amount of Acetaminophen Do not take more than  3,000 mg per day      Day 7    Time  Name of Medication Number of pills taken  Amount of Acetaminophen  Pain Level   Comments  AM PM       AM PM       AM PM       AM PM       AM PM       AM PM       AM PM       AM PM       Total Daily amount of Acetaminophen Do not take more than  3,000 mg per day        For additional information about how and where to safely dispose of unused opioid medications - [employer]  Disclaimer: This document contains information and/or instructional materials adapted from [place] for the typical patient with your condition. It does not replace medical advice from your health care provider because your experience may differ from that of the typical patient. Talk to your health care provider if you have any questions about  this document, your condition or your treatment plan. Adapted from [place] Instructions  Activity: Get plenty of rest for the remainder of the day. A responsible individual must stay with you for 24 hours following the procedure.  For the next 24 hours, DO NOT: -Drive a car -Paediatric nurse -Drink alcoholic beverages -Take any medication unless instructed by your physician -Make any legal decisions or sign important papers.  Meals: Start with liquid foods such as gelatin or soup. Progress to regular foods as tolerated. Avoid greasy, spicy, heavy foods. If nausea and/or vomiting occur, drink only clear liquids until the nausea and/or vomiting subsides. Call your physician if vomiting continues.  Special Instructions/Symptoms: Your throat may feel dry or sore from the anesthesia or the breathing tube placed in your throat during surgery. If this causes discomfort, gargle with warm salt water. The discomfort should disappear within 24 hours.  If you had a scopolamine patch placed behind your ear for the management of post- operative nausea and/or vomiting:  1. The medication in the patch is effective for 72 hours, after which it should be removed.  Wrap patch in a tissue and discard in the trash. Wash hands thoroughly with soap and water. 2. You may remove the patch earlier than 72 hours if you experience unpleasant side effects which may include dry mouth, dizziness or visual disturbances. 3. Avoid touching the patch. Wash your hands with soap and water after contact with the patch.       Regional Anesthesia Blocks  1. Numbness or the inability to move the "blocked" extremity may last from 3-48 hours after placement. The length of time depends on the medication injected and your individual response to the medication. If the numbness is not going away after 48 hours, call your surgeon.  2. The extremity that is blocked will need to be  protected until the numbness is gone and the  Strength has returned. Because you cannot feel it, you will need to take extra care to avoid injury. Because it may be weak, you may have difficulty moving it or using it. You may not know what position it is in without looking at it while the block is in effect.  3. For blocks in the legs and feet, returning to weight bearing and walking needs to be done carefully. You will need  to wait until the numbness is entirely gone and the strength has returned. You should be able to move your leg and foot normally before you try and bear weight or walk. You will need someone to be with you when you first try to ensure you do not fall and possibly risk injury.  4. Bruising and tenderness at the needle site are common side effects and will resolve in a few days.  5. Persistent numbness or new problems with movement should be communicated to the surgeon or the [place] [phone] [place] [phone]).

## 2019-05-24 NOTE — Anesthesia Procedure Notes (Signed)
Anesthesia Regional Block: Pectoralis block   Pre-Anesthetic Checklist: ,, timeout performed, Correct Patient, Correct Site, Correct Laterality, Correct Procedure, Correct Position, site marked, Risks and benefits discussed,  Surgical consent,  Pre-op evaluation,  At surgeon's request and post-op pain management  Laterality: Right  Prep: chloraprep       Needles:  Injection technique: Single-shot  Needle Type: Echogenic Stimulator Needle     Needle Length: 10cm  Needle Gauge: 21     Additional Needles:   Procedures:,,,, ultrasound used (permanent image in chart),,,,  Narrative:  Start time: 05/24/2019 10:55 AM End time: 05/24/2019 11:02 AM Injection made incrementally with aspirations every 5 mL.  Performed by: Personally  Anesthesiologist: Lidia Collum, MD  Additional Notes: Monitors applied. Injection made in 5cc increments. No resistance to injection. Good needle visualization. Patient tolerated procedure well.

## 2019-05-24 NOTE — Anesthesia Preprocedure Evaluation (Addendum)
Anesthesia Evaluation  Patient identified by MRN, date of birth, ID band Patient awake    Reviewed: Allergy & Precautions, NPO status , Patient's Chart, lab work & pertinent test results  History of Anesthesia Complications Negative for: history of anesthetic complications  Airway Mallampati: II  TM Distance: >3 FB Neck ROM: Limited    Dental  (+) Teeth Intact   Pulmonary neg pulmonary ROS,    Pulmonary exam normal        Cardiovascular negative cardio ROS Normal cardiovascular exam     Neuro/Psych  Headaches, PSYCHIATRIC DISORDERS Anxiety    GI/Hepatic negative GI ROS, Neg liver ROS,   Endo/Other  negative endocrine ROS  Renal/GU negative Renal ROS  negative genitourinary   Musculoskeletal  (+) Fibromyalgia -  Abdominal   Peds  Hematology negative hematology ROS (+)   Anesthesia Other Findings   Reproductive/Obstetrics                            Anesthesia Physical Anesthesia Plan  ASA: II  Anesthesia Plan: General   Post-op Pain Management: GA combined w/ Regional for post-op pain   Induction: Intravenous  PONV Risk Score and Plan: 3 and Ondansetron, Dexamethasone, Midazolam and Treatment may vary due to age or medical condition  Airway Management Planned: LMA  Additional Equipment: None  Intra-op Plan:   Post-operative Plan: Extubation in OR  Informed Consent: I have reviewed the patients History and Physical, chart, labs and discussed the procedure including the risks, benefits and alternatives for the proposed anesthesia with the patient or authorized representative who has indicated his/her understanding and acceptance.     Dental advisory given  Plan Discussed with:   Anesthesia Plan Comments:        Anesthesia Quick Evaluation

## 2019-05-24 NOTE — Progress Notes (Signed)
Assisted Dr. Witman with right, ultrasound guided, pectoralis block. Side rails up, monitors on throughout procedure. See vital signs in flow sheet. Tolerated Procedure well. 

## 2019-05-24 NOTE — Transfer of Care (Signed)
Immediate Anesthesia Transfer of Care Note  Patient: Holly Jenkins  Procedure(s) Performed: RIGHT BREAST LUMPECTOMY WITH RADIOACTIVE SEED AND RIGHT AXILLARY DEEP SENTINEL LYMPH NODE BIOPSY, INJECT BLUE DYE (Right Breast)  Patient Location: PACU  Anesthesia Type:GA combined with regional for post-op pain  Level of Consciousness: drowsy and patient cooperative  Airway & Oxygen Therapy: Patient Spontanous Breathing and Patient connected to nasal cannula oxygen  Post-op Assessment: Report given to RN and Post -op Vital signs reviewed and stable  Post vital signs: Reviewed and stable  Last Vitals:  Vitals Value Taken Time  BP    Temp    Pulse    Resp    SpO2      Last Pain:  Vitals:   05/24/19 1032  TempSrc: Oral  PainSc: 0-No pain         Complications: No apparent anesthesia complications

## 2019-05-24 NOTE — Assessment & Plan Note (Signed)
04/25/2019:Right breast mass identified on clinical examination. Mammogram showed 1.6cm indeterminate mass in the lower outer right breast, no abnormal axillary lymph nodes. 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  Recommendations: 1. Breast conserving surgery followed by 2. Oncotype DX testing to determine if chemotherapy would be of any benefit followed by 3. Adjuvant radiation therapy followed by 4. Adjuvant antiestrogen therapy with tamoxifen 20 mg daily x10 years ---------------------------------------------------------------------------------------------------------------------------- Right lumpectomy:  Pathology counseling: I discussed the final pathology report of the patient provided  a copy of this report. I discussed the margins as well as lymph node surgeries. We also discussed the final staging along with previously performed ER/PR and HER-2/neu testing.  Return to clinic based upon Oncotype DX test results 

## 2019-05-26 ENCOUNTER — Encounter (HOSPITAL_BASED_OUTPATIENT_CLINIC_OR_DEPARTMENT_OTHER): Payer: Self-pay | Admitting: General Surgery

## 2019-05-26 NOTE — Progress Notes (Signed)
Inform patient of Pathology report,. The breast cancer was 1.1 cm diameter It is completely removed with a negative margin All 4 lymph nodes are negative for cancer This is good news and she will not need any further surgery I will discuss this with her in detail at the next office visit let  me know that you were able to contact her  hmi

## 2019-05-29 NOTE — Telephone Encounter (Signed)
Noted, thank you. DW  °

## 2019-05-30 ENCOUNTER — Telehealth: Payer: Self-pay | Admitting: *Deleted

## 2019-05-30 NOTE — Telephone Encounter (Signed)
Ordered oncotype per Dr. Gudena. Faxed requisition to pathology. °

## 2019-05-30 NOTE — Progress Notes (Signed)
Patient Care Team: Marda Stalker, PA-C as PCP - General (Family Medicine) Mauro Kaufmann, RN as Oncology Nurse Navigator Rockwell Germany, RN as Oncology Nurse Navigator Fanny Skates, MD as Consulting Physician (General Surgery) Nicholas Lose, MD as Consulting Physician (Hematology and Oncology) Eppie Gibson, MD as Attending Physician (Radiation Oncology)  DIAGNOSIS:    ICD-10-CM   1. Malignant neoplasm of lower-outer quadrant of right breast of female, estrogen receptor positive ([address])  C50.511    Z17.0     SUMMARY OF ONCOLOGIC HISTORY: Oncology History  Malignant neoplasm of lower-outer quadrant of right breast of female, estrogen receptor positive ([place])  04/25/2019 Initial Diagnosis   Right breast mass identified on clinical examination. Mammogram showed 1.6cm indeterminate mass in the lower outer right breast, no abnormal axillary lymph nodes. 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   05/03/2019 Cancer Staging   Staging form: Breast, AJCC 8th Edition - Clinical stage from 05/03/2019: Stage IA (cT1c, cN0, cM0, G2, ER+, PR+, HER2-) - Signed by Nicholas Lose, MD on 05/03/2019    Genetic Testing   4 Variants of Uncertain Significance: VUS in BMPR1A called c.565T>C, VUS in NBN called c.724>A, VUS in NTHL1 called c.704G>A, and VUS in POLE c.1847G>A identified on the [facility] Breast Cancer STAT Panel  + Common Hereditary Cancers Panel. The STAT Breast cancer panel offered by [facility] includes sequencing and rearrangement analysis for the following 9 genes:  ATM, BRCA1, BRCA2, CDH1, CHEK2, PALB2, PTEN, STK11 and TP53.   The Common Hereditary Cancers Panel offered by [facility] includes sequencing and/or deletion duplication testing of the following 47 genes: APC, ATM, AXIN2, BARD1, BMPR1A, BRCA1, BRCA2, BRIP1, CDH1, CDKN2A (p14ARF), CDKN2A (p16INK4a), CKD4, CHEK2, CTNNA1, DICER1, EPCAM (Deletion/duplication testing only), GREM1 (promoter region  deletion/duplication testing only), KIT, MEN1, MLH1, MSH2, MSH3, MSH6, MUTYH, NBN, NF1, NHTL1, PALB2, PDGFRA, PMS2, POLD1, POLE, PTEN, RAD50, RAD51C, RAD51D, SDHB, SDHC, SDHD, SMAD4, SMARCA4. STK11, TP53, TSC1, TSC2, and VHL.  The following genes were evaluated for sequence changes only: SDHA and HOXB13 c.251G>A variant only. The report date is 05/09/2019.   05/24/2019 Surgery   Right lumpectomy Holly Jenkins): IDC, grade 1, 1.1cm, with LCIS and high grade DCIS, clear margins, and no evidence of malignancy in 4 lymph nodes.      CHIEF COMPLIANT: Follow-up s/p lumpectomy to review pathology  INTERVAL HISTORY: Holly Jenkins is a 43 y.o. with above-mentioned history of right breast cancer. Breast MRI on 05/09/19 showed the biopsy proven malignancy in the right breast measuring 0.8cm, with an adjacent 0.5cm mass suspicious for a satellite lesion, spanning a total of 1.6cm and no additional evidence of malignancy. She underwent a lumpectomy on 05/24/19 with Dr. 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 She presents to the clinic today to discuss the pathology report and further treatments.   REVIEW OF SYSTEMS:   Constitutional: Denies fevers, chills or abnormal weight loss Eyes: Denies blurriness of vision Ears, nose, mouth, throat, and face: Denies mucositis or sore throat Respiratory: Denies cough, dyspnea or wheezes Cardiovascular: Denies palpitation, chest discomfort Gastrointestinal: Denies nausea, heartburn or change in bowel habits Skin: Denies abnormal skin rashes Lymphatics: Denies new lymphadenopathy or easy bruising Neurological: Denies numbness, tingling or new weaknesses Behavioral/Psych: Mood is stable, no new changes  Extremities: No lower extremity edema Breast: denies any pain or lumps or nodules in either  breasts All other systems were reviewed with the  patient and are negative.  I have reviewed the past medical history, past surgical history, social history and family history with the patient and they are unchanged from previous note.  ALLERGIES:  is allergic to dust mite extract; eggs or egg-derived products; gluten meal; thimerosal; peanut-containing drug products; and sulfites.  MEDICATIONS:  Current Outpatient Medications  Medication Sig Dispense Refill   AJOVY 225 MG/1.5ML SOSY INJECT 1 syringe SUBCUTANEOUSLY EVERY 30 DAYS 1.5 mL 4   Ascorbic Acid (VITA-C PO) Take 1 tablet by mouth at bedtime.      Bilberry 100 MG CAPS Take 1 capsule by mouth. Daily at night     BLACK CURRANT SEED OIL PO Take 1 tablet by mouth 2 (two) times daily.     botulinum toxin Type A (BOTOX) 100 units SOLR injection Inject IM every 3 months into head and neck muscles by provider in the office 2 vial 3   clonazePAM (KLONOPIN) 0.5 MG tablet Take 0.5 mg by mouth 2 (two) times daily as needed for anxiety.     Diclofenac Potassium (CAMBIA) 50 MG PACK MIX 1 PACKET WITH 1-2 OUNCES OF WATER. DRINK IMMEDIATELY AS A SINGLE DOSE 9 each 11   etonogestrel-ethinyl estradiol (NUVARING) 0.12-0.015 MG/24HR vaginal ring Insert vaginally and leave in place for 4 consecutive weeks, then replace with new one. 3 each 4   HYDROcodone-acetaminophen (NORCO) 5-325 MG tablet Take 1-2 tablets by mouth every 6 (six) hours as needed for moderate pain or severe pain. 20 tablet 0   Melatonin-Pyridoxine (MELATIN PO) Take 1 tablet by mouth daily.     Menaquinone-7 (VITAMIN K2 PO) Take 1 tablet by mouth daily.     metoCLOPramide (REGLAN) 10 MG tablet Take 1 tablet (10 mg total) by mouth every 6 (six) hours as needed for nausea. May take for migraine. 30 tablet 6   Multiple Vitamins-Minerals (ZINC PO) Take 1 tablet by mouth 2 (two) times daily before a meal.      NON FORMULARY D-HIST  2 TABS PO QD     NON FORMULARY Take 1 tablet by mouth. ALA     NON FORMULARY Take 1 tablet by  mouth 2 (two) times daily before a meal. [place]     NON FORMULARY Take 1 tablet by mouth daily. Trichromium daily with food     NON FORMULARY Take 2 tablets by mouth every morning. A-Drenal     NON FORMULARY Take 1 tablet by mouth 2 (two) times daily before a meal. L-Carnatine  Two times daily (morning and afternoon)     NON FORMULARY Take 1 tablet by mouth. Methylation Complete 1 tab daily in the afternoon under the tongue     NON FORMULARY Take 4 tablets by mouth. Mega IgG 2000 daily in the afternoon     Omega-3 Fatty Acids (OMEGA 3 PO) Take 1 tablet by mouth daily.     ondansetron (ZOFRAN) 4 MG tablet Take acutely at onset of migraine or nausea. As needed.Maximum 4x a day 20 tablet 6   OVER THE COUNTER MEDICATION Take 1 tablet by mouth daily. Vitamin D3     OVER THE COUNTER MEDICATION Take 1 tablet by mouth. Iron one tablet daily in the afternoon     PHOSPHATIDYLSERINE PO Take 2 tablets by mouth 2 (two) times daily.     propranolol (INDERAL) 10 MG tablet Take acutely for migraine 60 tablet 6   Thyroid (NATURE-THROID PO) Take by mouth daily.  TURMERIC PO Take by mouth daily.     VITAMIN B COMPLEX-C PO Take 1 tablet by mouth daily.     No current facility-administered medications for this visit.     PHYSICAL EXAMINATION: ECOG PERFORMANCE STATUS: 1 - Symptomatic but completely ambulatory  Vitals:   05/31/19 1526  BP: 115/78  Pulse: 79  Resp: 19  Temp: 98.9 F (37.2 C)  SpO2: 100%   Filed Weights   05/31/19 1526  Weight: 142 lb 12.8 oz (64.8 kg)    GENERAL: alert, no distress and comfortable SKIN: skin color, texture, turgor are normal, no rashes or significant lesions EYES: normal, Conjunctiva are pink and non-injected, sclera clear OROPHARYNX: no exudate, no erythema and lips, buccal mucosa, and tongue normal  NECK: supple, thyroid normal size, non-tender, without nodularity LYMPH: no palpable lymphadenopathy in the cervical, axillary or inguinal LUNGS:  clear to auscultation and percussion with normal breathing effort HEART: regular rate & rhythm and no murmurs and no lower extremity edema ABDOMEN: abdomen soft, non-tender and normal bowel sounds MUSCULOSKELETAL: no cyanosis of digits and no clubbing  NEURO: alert & oriented x 3 with fluent speech, no focal motor/sensory deficits EXTREMITIES: No lower extremity edema  LABORATORY DATA:  I have reviewed the data as listed CMP Latest Ref Rng & Units 05/03/2019  Glucose 70 - 99 mg/dL 98  BUN 6 - 20 mg/dL 14  Creatinine 0.44 - 1.00 mg/dL 0.73  Sodium 135 - 145 mmol/L 139  Potassium 3.5 - 5.1 mmol/L 4.2  Chloride 98 - 111 mmol/L 105  CO2 22 - 32 mmol/L 24  Calcium 8.9 - 10.3 mg/dL 9.3  Total Protein 6.5 - 8.1 g/dL 7.1  Total Bilirubin 0.3 - 1.2 mg/dL 0.4  Alkaline Phos 38 - 126 U/L 39  AST 15 - 41 U/L 13(L)  ALT 0 - 44 U/L 11    Lab Results  Component Value Date   WBC 7.4 05/03/2019   HGB 12.7 05/03/2019   HCT 38.8 05/03/2019   MCV 87.8 05/03/2019   PLT 256 05/03/2019   NEUTROABS 5.3 05/03/2019    ASSESSMENT & PLAN:  Malignant neoplasm of lower-outer quadrant of right breast of female, estrogen receptor positive ([place]) 04/25/2019:Right breast mass identified on clinical examination. Mammogram showed 1.6cm indeterminate mass in the lower outer right breast, no abnormal axillary lymph nodes. 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  Recommendations: 1. Breast conserving surgery 05/24/2019 2. Oncotype DX testing to determine if chemotherapy would be of any benefit followed by 3. Adjuvant radiation therapy followed by 4. Adjuvant antiestrogen therapy with tamoxifen 20 mg daily x10 years ---------------------------------------------------------------------------------------------------------------------------- 05/24/2019 right lumpectomy: invasive ductal carcinoma, grade 1, 1.1cm, with LCIS and high grade DCIS, clear margins, and no evidence of  malignancy in 4 lymph nodes. T1CN0 stage Ia  Pathology counseling: I discussed the final pathology report of the patient provided  a copy of this report. I discussed the margins as well as lymph node surgeries. We also discussed the final staging along with previously performed ER/PR and HER-2/neu testing.  Return to clinic based upon Oncotype DX test results    No orders of the defined types were placed in this encounter.  The patient has a good understanding of the overall plan. she agrees with it. she will call with any problems that may develop before the next visit here.  Nicholas Lose, MD 05/31/2019  Julious Oka Dorshimer am acting as scribe for Dr. Nicholas Lose.  I  have reviewed the above documentation for accuracy and completeness, and I agree with the above.

## 2019-05-31 ENCOUNTER — Other Ambulatory Visit: Payer: Self-pay

## 2019-05-31 ENCOUNTER — Inpatient Hospital Stay: Payer: BC Managed Care – PPO | Attending: Hematology and Oncology | Admitting: Hematology and Oncology

## 2019-05-31 DIAGNOSIS — Z170 Estrogen receptor positive status [ER+]: Secondary | ICD-10-CM | POA: Diagnosis not present

## 2019-05-31 DIAGNOSIS — Z793 Long term (current) use of hormonal contraceptives: Secondary | ICD-10-CM | POA: Insufficient documentation

## 2019-05-31 DIAGNOSIS — C50511 Malignant neoplasm of lower-outer quadrant of right female breast: Secondary | ICD-10-CM

## 2019-05-31 DIAGNOSIS — Z79899 Other long term (current) drug therapy: Secondary | ICD-10-CM | POA: Diagnosis not present

## 2019-06-01 ENCOUNTER — Encounter: Payer: Self-pay | Admitting: *Deleted

## 2019-06-06 ENCOUNTER — Telehealth: Payer: Self-pay | Admitting: *Deleted

## 2019-06-06 DIAGNOSIS — Z170 Estrogen receptor positive status [ER+]: Secondary | ICD-10-CM

## 2019-06-06 DIAGNOSIS — C50511 Malignant neoplasm of lower-outer quadrant of right female breast: Secondary | ICD-10-CM

## 2019-06-06 NOTE — Telephone Encounter (Signed)
Received oncotype score of 18/5%. Physician team notified. Called pt with results and discussed chemo not recommended. Informed next step will be xrt with Dr. Isidore Moos. Received verbal understanding. Denies further questions or needs. Referral placed for Dr. Isidore Moos

## 2019-06-07 ENCOUNTER — Encounter (HOSPITAL_COMMUNITY): Payer: Self-pay | Admitting: Hematology and Oncology

## 2019-06-07 NOTE — Progress Notes (Signed)
Location of Breast Cancer: Right Breast  Histology per Pathology Report:  04/21/19 Diagnosis Breast, right, needle core biopsy, LOQ, 8 o'clock, 2 cm fn - INVASIVE DUCTAL CARCINOMA.  Receptor Status: ER(100%), PR (100%), Her2-neu (NEG), Ki-(2%)  05/24/19 Diagnosis 1. Breast, lumpectomy, Right w/seed - INVASIVE DUCTAL CARCINOMA, GRADE I/III, SPANNING 1.1 CM. - LOBULAR NEOPLASIA (LOBULAR CARCINOMA IN SITU). - DUCTAL CARCINOMA IN SITU, HIGH GRADE. - FIBROADENOMA. - THE SURGICAL RESECTION MARGINS ARE NEGATIVE FOR CARCINOMA. - SEE ONCOLOGY TABLE BELOW. 2. Lymph node, sentinel, biopsy, Right axillary #1 - THERE IS NO EVIDENCE OF CARCINOMA IN 1 OF 1 LYMPH NODE (0/1). 3. Lymph node, sentinel, biopsy, Right axillary #2 - BENIGN BREAST PARENCHYMA. - THERE IS NO EVIDENCE OF MALIGNANCY. 4. Lymph node, sentinel, biopsy, Right axillary #3 - THERE IS NO EVIDENCE OF CARCINOMA IN 1 OF 1 LYMPH NODE (0/1). 5. Lymph node, sentinel, biopsy, Right axillary #4 - THERE IS NO EVIDENCE OF CARCINOMA IN 1 OF 1 LYMPH NODE (0/1). 6. Lymph node, sentinel, biopsy, Right axillary #5 - THERE IS NO EVIDENCE OF CARCINOMA IN 1 OF 1 LYMPH NODE (0/1).  Did patient present with symptoms or was this found on screening mammography?: She presented with a palpable mass to her OBGYN.  Past/Anticipated interventions by surgeon, if any: 05/24/19 Procedure:                 Inject blue dye right breast, right breast lumpectomy with radioactive seed localization, right axillary deep sentinel lymph node biopsy Surgeon:                     Edsel Petrin. [name], M.D., [facility]   Past/Anticipated interventions by medical oncology, if any:  05/31/19 Dr. Lindi Adie Recommendations: 1. Breast conserving surgery 05/24/2019 2. Oncotype DX testing to determine if chemotherapy would be of any benefit followed by 3. Adjuvant radiation therapy followed by 4. Adjuvant antiestrogen therapywith tamoxifen 20 mg daily x10  years ---------------------------------------------------------------------------------------------------------------------------- 05/24/2019 right lumpectomy: invasive ductal carcinoma, grade 1, 1.1cm, with LCIS and high grade DCIS, clear margins, and no evidence of malignancy in 4 lymph nodes. T1CN0 stage Ia  Pathology counseling: I discussed the final pathology report of the patient provided  a copy of this report. I discussed the margins as well as lymph node surgeries. We also discussed the final staging along with previously performed ER/PR and HER-2/neu testing.  Return to clinic based upon Oncotype DX test results- per note from breast navigator on 06/06/19, she will not need chemotherapy.    Lymphedema issues, if any:  She reports improvement in swelling. She has some decreased arm movement.   Pain issues, if any:  Yes, she reports sudden stabs of pain to her right breast at times.   SAFETY ISSUES:  Prior radiation? No  Pacemaker/ICD? No  Possible current pregnancy? Pregnancy test on 05/24/19 was negative. She is still having periods. She is using birth control.   Is the patient on methotrexate? No  Current Complaints / other details:      Sunil Hue, Stephani Police, RN 06/07/2019,2:16 PM

## 2019-06-08 ENCOUNTER — Encounter: Payer: Self-pay | Admitting: *Deleted

## 2019-06-12 NOTE — Progress Notes (Addendum)
Radiation Oncology         [phone] ________________________________  Name: Holly Jenkins MRN: [id number]  Date: 06/13/2019  DOB: 07-26-1976  Follow-Up Visit Note by WebEx due to pandemic precautions   Outpatient  CC: Marda Stalker, Gennette Pac, MD  Diagnosis:      ICD-10-CM   1. Malignant neoplasm of lower-outer quadrant of right breast of female, estrogen receptor positive ([place])  C50.511 Pregnancy, urine   Z17.0      Cancer Staging Malignant neoplasm of lower-outer quadrant of right breast of female, estrogen receptor positive ([place]) Staging form: Breast, AJCC 8th Edition - Clinical stage from 05/03/2019: Stage IA (cT1c, cN0, cM0, G2, ER+, PR+, HER2-) - Signed by Nicholas Lose, MD on 05/03/2019 - Pathologic stage from 05/24/2019: Stage IA (pT1c, pN0, cM0, G1, ER+, PR+, HER2-) - Signed by Gardenia Phlegm, NP on 06/07/2019   CHIEF COMPLAINT: Here to discuss management of right breast cancer  Narrative:  The patient returns today for follow-up to discuss radiation treatment options. She was seen in the multidisciplinary breast clinic on 05/03/2019.     Since consultation date, she underwent bilateral breast MRI on 05/09/2019 revealing: 8 mm biopsy-proven IDC at 8 o'clock in the right breast with 3 mm satellite lesion, together measuring 1.6 cm; no additional masses or dominant nodules suspicious for malignancy in either breast.    She also underwent genetic counseling on 05/10/2019, which showed negative genetics testing. Four VUS were identified: BMPR1A, NBN, NTHL1, and POLE.  She opted to proceed with right breast lumpectomy with sentinel lymph node biopsy on date of 05/24/2019 with pathology report revealing: tumor size of 1.1 cm; histology of ductal carcinoma; margin status to invasive disease of greater than 0.2 cm; nodal status of negative (0/4). Prognostic panel not repeated on final surgical sample. From biopsy: ER status: 100% positive; PR status 100%  positive, Her2 status negative; Grade 1.  Oncotype DX was obtained on the final surgical sample and the recurrence score of 18 predicts a risk of recurrence outside the breast over the next 9 years of 5%, if the patient's only systemic therapy is an antiestrogen for 5 years.  It also predicts no significant benefit from chemotherapy.  Symptomatically, the patient reports: healing well from surgery.  She has sudden stabs of pain in her right breast at times.  She denies prior radiotherapy and she denies current pregnancy.  She had a negative pregnancy test on 05/24/2019.  She has stopped using her NuvaRing.        ALLERGIES:  is allergic to dust mite extract; eggs or egg-derived products; gluten meal; thimerosal; peanut-containing drug products; and sulfites.  Meds: Current Outpatient Medications  Medication Sig Dispense Refill   AJOVY 225 MG/1.5ML SOSY INJECT 1 syringe SUBCUTANEOUSLY EVERY 30 DAYS 1.5 mL 4   Ascorbic Acid (VITA-C PO) Take 1 tablet by mouth at bedtime.      BLACK CURRANT SEED OIL PO Take 1 tablet by mouth 2 (two) times daily.     botulinum toxin Type A (BOTOX) 100 units SOLR injection Inject IM every 3 months into head and neck muscles by provider in the office 2 vial 3   clonazePAM (KLONOPIN) 0.5 MG tablet Take 0.5 mg by mouth 2 (two) times daily as needed for anxiety.     Diclofenac Potassium (CAMBIA) 50 MG PACK MIX 1 PACKET WITH 1-2 OUNCES OF WATER. DRINK IMMEDIATELY AS A SINGLE DOSE 9 each 11   Melatonin-Pyridoxine (MELATIN PO) Take 1 tablet  by mouth daily.     Menaquinone-7 (VITAMIN K2 PO) Take 1 tablet by mouth daily.     metoCLOPramide (REGLAN) 10 MG tablet Take 1 tablet (10 mg total) by mouth every 6 (six) hours as needed for nausea. May take for migraine. 30 tablet 6   Multiple Vitamins-Minerals (ZINC PO) Take 1 tablet by mouth 2 (two) times daily before a meal.      NON FORMULARY D-HIST  2 TABS PO QD     NON FORMULARY Take 1 tablet by mouth. Methylation  Complete 1 tab daily in the afternoon under the tongue     Omega-3 Fatty Acids (OMEGA 3 PO) Take 1 tablet by mouth daily.     ondansetron (ZOFRAN) 4 MG tablet Take acutely at onset of migraine or nausea. As needed.Maximum 4x a day 20 tablet 6   OVER THE COUNTER MEDICATION Take 1 tablet by mouth daily. Vitamin D3     OVER THE COUNTER MEDICATION Take 1 tablet by mouth. Iron one tablet daily in the afternoon     propranolol (INDERAL) 10 MG tablet Take acutely for migraine 60 tablet 6   Thyroid (NATURE-THROID PO) Take by mouth daily.     TURMERIC PO Take by mouth daily.     VITAMIN B COMPLEX-C PO Take 1 tablet by mouth daily.     Bilberry 100 MG CAPS Take 1 capsule by mouth. Daily at night     etonogestrel-ethinyl estradiol (NUVARING) 0.12-0.015 MG/24HR vaginal ring Insert vaginally and leave in place for 4 consecutive weeks, then replace with new one. 3 each 4   HYDROcodone-acetaminophen (NORCO) 5-325 MG tablet Take 1-2 tablets by mouth every 6 (six) hours as needed for moderate pain or severe pain. 20 tablet 0   NON FORMULARY Take 1 tablet by mouth. ALA     NON FORMULARY Take 1 tablet by mouth 2 (two) times daily before a meal. [place]     NON FORMULARY Take 1 tablet by mouth daily. Trichromium daily with food     NON FORMULARY Take 2 tablets by mouth every morning. A-Drenal     NON FORMULARY Take 1 tablet by mouth 2 (two) times daily before a meal. L-Carnatine  Two times daily (morning and afternoon)     NON FORMULARY Take 4 tablets by mouth. Mega IgG 2000 daily in the afternoon     PHOSPHATIDYLSERINE PO Take 2 tablets by mouth 2 (two) times daily.     No current facility-administered medications for this encounter.     Physical Findings:  vitals were not taken for this visit. .    No acute distress  Lab Findings: Lab Results  Component Value Date   WBC 7.4 05/03/2019   HGB 12.7 05/03/2019   HCT 38.8 05/03/2019   MCV 87.8 05/03/2019   PLT 256 05/03/2019      Radiographic Findings: Nm Sentinel Node Inj-no Rpt (breast)  Result Date: 05/24/2019 Sulfur colloid was injected by the nuclear medicine technologist for melanoma sentinel node.   Mm Breast Surgical Specimen  Result Date: 05/24/2019 CLINICAL DATA:  Status post surgical excision of a right breast lesion following radioactive seed localization. EXAM: SPECIMEN RADIOGRAPH OF THE RIGHT BREAST COMPARISON:  Previous exam(s). FINDINGS: Status post excision of the right breast. The radioactive seed and biopsy marker clip are present, completely intact, and were marked for pathology. IMPRESSION: Specimen radiograph of the right breast. Electronically Signed   By: Lajean Manes M.D.   On: 05/24/2019 12:42   [place] Rt Radioactive  Seed Loc  Result Date: 05/23/2019 CLINICAL DATA:  Pre lumpectomy localization of a recently diagnosed invasive ductal carcinoma in the 8 o'clock position of the right breast. EXAM: ULTRASOUND GUIDED RADIOACTIVE SEED LOCALIZATION OF THE RIGHT BREAST COMPARISON:  Previous exam(s). FINDINGS: Patient presents for radioactive seed localization prior to right lumpectomy. I met with the patient and we discussed the procedure of seed localization including benefits and alternatives. We discussed the high likelihood of a successful procedure. We discussed the risks of the procedure including infection, bleeding, tissue injury and further surgery. We discussed the low dose of radioactivity involved in the procedure. Informed, written consent was given. The usual time-out protocol was performed immediately prior to the procedure. Using ultrasound guidance, sterile technique, 1% lidocaine and an I-125 radioactive seed, the recently biopsied mass and ribbon shaped biopsy marker clip in the 8 o'clock position of the right breast, 2 cm from the nipple, was localized using a caudal approach. The follow-up mammogram images confirm the seed in the expected location and were marked for Dr. [name].  Follow-up survey of the patient confirms presence of the radioactive seed. Order number of I-125 seed:  [id number]. Total activity:  0.252 mCi reference Date: 05/10/2019 The patient tolerated the procedure well and was released from the [facility]. She was given instructions regarding seed removal. IMPRESSION: Radioactive seed localization right breast. No apparent complications. Electronically Signed   By: Claudie Revering M.D.   On: 05/23/2019 14:38   Mm Clip Placement Right  Result Date: 05/23/2019 CLINICAL DATA:  Status post ultrasound-guided right breast radioactive seed placement prior to lumpectomy. EXAM: DIAGNOSTIC RIGHT MAMMOGRAM POST ULTRASOUND-GUIDED RADIOACTIVE SEED PLACEMENT COMPARISON:  Previous exam(s). FINDINGS: Mammographic images were obtained following ultrasound-guided radioactive seed placement. These demonstrate the radioactive seed immediately adjacent to the previously placed ribbon shaped biopsy marker clip in the posterior aspect of the lower outer quadrant of the right breast. IMPRESSION: Appropriate location of the radioactive seed. Final Assessment: Post Procedure Mammograms for Seed Placement Electronically Signed   By: Claudie Revering M.D.   On: 05/23/2019 14:34    Impression/Plan: Stage IA Right Breast Cancer  We discussed adjuvant radiotherapy today.  I recommend 4 weeks directed at the right breast in order to reduce risk of local regional recurrence by two thirds.  The risks, benefits and side effects of this treatment were discussed in detail.  She understands that radiotherapy is associated with skin irritation and fatigue in the acute setting. Late effects can include cosmetic changes and rare injury to internal organs.  She is enthusiastic about proceeding with treatment.   Simulation will take place next week. A total of 3 medically necessary complex treatment devices will be fabricated and supervised by me: 2 fields with MLCs for custom blocks to protect heart, and  lungs;  and, a Vac-lok. MORE COMPLEX DEVICES MAY BE MADE IN DOSIMETRY FOR FIELD IN FIELD BEAMS FOR DOSE HOMOGENEITY.  I have requested : 3D Simulation which is medically necessary to give adequate dose to at risk tissues while sparing lungs and heart.  I have requested a DVH of the following structures: lungs, heart, right lumpectomy cavity.    The patient will receive 40.05 Gy in 15 fractions to the right breast with 2 fields.  This will be  followed by a boost.   This encounter was provided by telemedicine platform Webex.  The patient has given verbal consent for this type of encounter and has been advised to only accept a meeting of this type  in a secure network environment. The time spent during this encounter was 10 minutes. The attendants for this meeting include Eppie Gibson  and Marina Goodell.  During the encounter, Eppie Gibson was located at [facility] Radiation Oncology Department.  Marina Goodell was located at home.  _____________________________________   Eppie Gibson, MD   This document serves as a record of services personally performed by Eppie Gibson, MD. It was created on her behalf by Wilburn Mylar, a trained medical scribe. The creation of this record is based on the scribe's personal observations and the provider's statements to them. This document has been checked and approved by the attending provider.

## 2019-06-13 ENCOUNTER — Other Ambulatory Visit: Payer: Self-pay

## 2019-06-13 ENCOUNTER — Ambulatory Visit
Admission: RE | Admit: 2019-06-13 | Discharge: 2019-06-13 | Disposition: A | Discharge status: Home / Self Care | Payer: BC Managed Care – PPO | Source: Ambulatory Visit | Attending: Radiation Oncology | Admitting: Radiation Oncology

## 2019-06-13 ENCOUNTER — Encounter: Payer: Self-pay | Admitting: Radiation Oncology

## 2019-06-13 ENCOUNTER — Telehealth: Payer: Self-pay | Admitting: *Deleted

## 2019-06-13 DIAGNOSIS — C50511 Malignant neoplasm of lower-outer quadrant of right female breast: Secondary | ICD-10-CM

## 2019-06-13 DIAGNOSIS — Z170 Estrogen receptor positive status [ER+]: Secondary | ICD-10-CM

## 2019-06-13 NOTE — Telephone Encounter (Signed)
CALLED PATIENT TO INFORM OF LAB APPT., PATIENT AGREED TO COME ON 06-20-19 @ 9:15 AM AFTER SIM

## 2019-06-14 ENCOUNTER — Ambulatory Visit (INDEPENDENT_AMBULATORY_CARE_PROVIDER_SITE_OTHER): Payer: Self-pay | Admitting: Family Medicine

## 2019-06-14 ENCOUNTER — Other Ambulatory Visit: Payer: Self-pay

## 2019-06-14 ENCOUNTER — Encounter: Payer: Self-pay | Admitting: Family Medicine

## 2019-06-14 VITALS — Temp 98.4°F

## 2019-06-14 DIAGNOSIS — G43709 Chronic migraine without aura, not intractable, without status migrainosus: Secondary | ICD-10-CM

## 2019-06-14 NOTE — Progress Notes (Signed)
Consent Form Botulism Toxin Injection For Chronic Migraine  She continues to note benefit with Botox. About 6-8 migraines per month. Baseline > 20 per month.   Reviewed orally with patient, additionally signature is on file:  Botulism toxin has been approved by the Federal drug administration for treatment of chronic migraine. Botulism toxin does not cure chronic migraine and it may not be effective in some patients.  The administration of botulism toxin is accomplished by injecting a small amount of toxin into the muscles of the neck and head. Dosage must be titrated for each individual. Any benefits resulting from botulism toxin tend to wear off after 3 months with a repeat injection required if benefit is to be maintained. Injections are usually done every 3-4 months with maximum effect peak achieved by about 2 or 3 weeks. Botulism toxin is expensive and you should be sure of what costs you will incur resulting from the injection.  The side effects of botulism toxin use for chronic migraine may include:   -Transient, and usually mild, facial weakness with facial injections  -Transient, and usually mild, head or neck weakness with head/neck injections  -Reduction or loss of forehead facial animation due to forehead muscle weakness  -Eyelid drooping  -Dry eye  -Pain at the site of injection or bruising at the site of injection  -Double vision  -Potential unknown long term risks   Contraindications: You should not have Botox if you are pregnant, nursing, allergic to albumin, have an infection, skin condition, or muscle weakness at the site of the injection, or have myasthenia gravis, Lambert-Eaton syndrome, or ALS.  It is also possible that as with any injection, there may be an allergic reaction or no effect from the medication. Reduced effectiveness after repeated injections is sometimes seen and rarely infection at the injection site may occur. All care will be taken to prevent these  side effects. If therapy is given over a long time, atrophy and wasting in the muscle injected may occur. Occasionally the patient's become refractory to treatment because they develop antibodies to the toxin. In this event, therapy needs to be modified.  I have read the above information and consent to the administration of botulism toxin.    BOTOX PROCEDURE NOTE FOR MIGRAINE HEADACHE  Contraindications and precautions discussed with patient(above). Aseptic procedure was observed and patient tolerated procedure. Procedure performed by Dr. Georgia Dom  The condition has existed for more than 6 months, and pt does not have a diagnosis of ALS, Myasthenia Gravis or Lambert-Eaton Syndrome.  Risks and benefits of injections discussed and pt agrees to proceed with the procedure.  Written consent obtained  These injections are medically necessary. Pt  receives good benefits from these injections. These injections do not cause sedations or hallucinations which the oral therapies may cause.   Description of procedure:  The patient was placed in a sitting position. The standard protocol was used for Botox as follows, with 5 units of Botox injected at each site:  -Procerus muscle, midline injection  -Corrugator muscle, bilateral injection  -Frontalis muscle, bilateral injection, with 2 sites each side, medial injection was performed in the upper one third of the frontalis muscle, in the region vertical from the medial inferior edge of the superior orbital rim. The lateral injection was again in the upper one third of the forehead vertically above the lateral limbus of the cornea, 1.5 cm lateral to the medial injection site.  -Temporalis muscle injection, 4 sites, bilaterally. The first injection  was 3 cm above the tragus of the ear, second injection site was 1.5 cm to 3 cm up from the first injection site in line with the tragus of the ear. The third injection site was 1.5-3 cm forward between the first  2 injection sites. The fourth injection site was 1.5 cm posterior to the second injection site. 5th site laterally in the temporalis  muscleat the level of the outer canthus.  -Occipitalis muscle injection, 3 sites, bilaterally. The first injection was done one half way between the occipital protuberance and the tip of the mastoid process behind the ear. The second injection site was done lateral and superior to the first, 1 fingerbreadth from the first injection. The third injection site was 1 fingerbreadth superiorly and medially from the first injection site.  -Cervical paraspinal muscle injection, 2 sites, bilateral knee first injection site was 1 cm from the midline of the cervical spine, 3 cm inferior to the lower border of the occipital protuberance. The second injection site was 1.5 cm superiorly and laterally to the first injection site.  -Trapezius muscle injection was performed at 3 sites, bilaterally. The first injection site was in the upper trapezius muscle halfway between the inflection point of the neck, and the acromion. The second injection site was one half way between the acromion and the first injection site. The third injection was done between the first injection site and the inflection point of the neck.  -LS bilaterally   Will return for repeat injection in 3 months.   A 165 units of Botox was used, any Botox not injected was wasted. The patient tolerated the procedure well, there were no complications of the above procedure.

## 2019-06-14 NOTE — Progress Notes (Signed)
Botox- 100 units x 2 vials Lot: [id number] Expiration: 11/2021 NDC: [id number]  Bacteriostatic 0.9% Sodium Chloride- 25mL total Lot: [id number] Expiration: 07/13/2019 NDC: [id number]  Dx: [id number] B/B

## 2019-06-15 ENCOUNTER — Encounter: Payer: Self-pay | Admitting: *Deleted

## 2019-06-16 ENCOUNTER — Telehealth: Payer: Self-pay | Admitting: Hematology and Oncology

## 2019-06-16 NOTE — Telephone Encounter (Signed)
Scheduled appt per 9/3 sch message - mailed appt with date and time

## 2019-06-20 ENCOUNTER — Other Ambulatory Visit: Payer: Self-pay

## 2019-06-20 ENCOUNTER — Ambulatory Visit
Admission: RE | Admit: 2019-06-20 | Discharge: 2019-06-20 | Disposition: A | Discharge status: Home / Self Care | Payer: BC Managed Care – PPO | Source: Ambulatory Visit | Attending: Radiation Oncology | Admitting: Radiation Oncology

## 2019-06-20 DIAGNOSIS — Z510 Encounter for antineoplastic radiation therapy: Secondary | ICD-10-CM | POA: Diagnosis not present

## 2019-06-20 DIAGNOSIS — C50511 Malignant neoplasm of lower-outer quadrant of right female breast: Secondary | ICD-10-CM | POA: Insufficient documentation

## 2019-06-20 DIAGNOSIS — Z170 Estrogen receptor positive status [ER+]: Secondary | ICD-10-CM | POA: Insufficient documentation

## 2019-06-20 LAB — PREGNANCY, URINE: Preg Test, Ur: NEGATIVE

## 2019-06-21 DIAGNOSIS — C50511 Malignant neoplasm of lower-outer quadrant of right female breast: Secondary | ICD-10-CM | POA: Diagnosis not present

## 2019-06-23 ENCOUNTER — Telehealth: Payer: Self-pay | Admitting: Family Medicine

## 2019-06-23 NOTE — Telephone Encounter (Signed)
Pt has called stating she was to hear back from [facility] re: the scheduling of her next Botox, please call

## 2019-06-23 NOTE — Progress Notes (Signed)
  Radiation Oncology         [phone] ________________________________  Name: Holly Jenkins MRN: [id number]  Date: 06/20/2019  DOB: 05-19-1976  SIMULATION AND TREATMENT PLANNING NOTE    Outpatient  DIAGNOSIS:     ICD-10-CM   1. Malignant neoplasm of lower-outer quadrant of right breast of female, estrogen receptor positive ([place])  C50.511    Z17.0     NARRATIVE:  The patient was brought to the [facility].  Identity was confirmed.  All relevant records and images related to the planned course of therapy were reviewed.  The patient freely provided informed written consent to proceed with treatment after reviewing the details related to the planned course of therapy. The consent form was witnessed and verified by the simulation staff.    Then, the patient was set-up in a stable reproducible supine position for radiation therapy with her ipsilateral arm over her head, and her upper body secured in a custom-made Vac-lok device.  CT images were obtained.  Surface markings were placed.  The CT images were loaded into the planning software.    TREATMENT PLANNING NOTE: Treatment planning then occurred.  The radiation prescription was entered and confirmed.     A total of 3 medically necessary complex treatment devices were fabricated and supervised by me: 2 fields with MLCs for custom blocks to protect heart, and lungs;  and, a Vac-lok. MORE COMPLEX DEVICES MAY BE MADE IN DOSIMETRY FOR FIELD IN FIELD BEAMS FOR DOSE HOMOGENEITY.  I have requested : 3D Simulation which is medically necessary to give adequate dose to at risk tissues while sparing lungs and heart.  I have requested a DVH of the following structures: lungs, heart, right lumpectomy cavity.    The patient will receive 40.05 Gy in 15 fractions to the right breast with 2 tangential fields.  This will be followed by a boost.  Optical Surface Tracking Plan:  Since intensity modulated radiotherapy (IMRT) and 3D  conformal radiation treatment methods are predicated on accurate and precise positioning for treatment, intrafraction motion monitoring is medically necessary to ensure accurate and safe treatment delivery. The ability to quantify intrafraction motion without excessive ionizing radiation dose can only be performed with optical surface tracking. Accordingly, surface imaging offers the opportunity to obtain 3D measurements of patient position throughout IMRT and 3D treatments without excessive radiation exposure. I am ordering optical surface tracking for this patient's upcoming course of radiotherapy.  ________________________________   Reference:  Ursula Alert, J, et al. Surface imaging-based analysis of intrafraction motion for breast radiotherapy patients.Journal of [place], n. 6, nov. 2014. ISSN [id number].  Available at: <http://www.jacmp.org/index.php/jacmp/article/view/4957>.    -----------------------------------  Eppie Gibson, MD

## 2019-06-26 NOTE — Telephone Encounter (Signed)
I called and scheduled the patient for 10 weeks out. She is paying out of pocket and states she discussed this with Amy NP. DW

## 2019-06-29 ENCOUNTER — Other Ambulatory Visit: Payer: Self-pay

## 2019-06-29 ENCOUNTER — Ambulatory Visit
Admission: RE | Admit: 2019-06-29 | Discharge: 2019-06-29 | Disposition: A | Discharge status: Home / Self Care | Payer: BC Managed Care – PPO | Source: Ambulatory Visit | Attending: Radiation Oncology | Admitting: Radiation Oncology

## 2019-06-29 DIAGNOSIS — C50511 Malignant neoplasm of lower-outer quadrant of right female breast: Secondary | ICD-10-CM | POA: Diagnosis not present

## 2019-06-30 ENCOUNTER — Ambulatory Visit
Admission: RE | Admit: 2019-06-30 | Discharge: 2019-06-30 | Disposition: A | Discharge status: Home / Self Care | Payer: BC Managed Care – PPO | Source: Ambulatory Visit | Attending: Radiation Oncology | Admitting: Radiation Oncology

## 2019-06-30 ENCOUNTER — Other Ambulatory Visit: Payer: Self-pay

## 2019-06-30 DIAGNOSIS — C50511 Malignant neoplasm of lower-outer quadrant of right female breast: Secondary | ICD-10-CM | POA: Diagnosis not present

## 2019-07-03 ENCOUNTER — Other Ambulatory Visit: Payer: Self-pay

## 2019-07-03 ENCOUNTER — Ambulatory Visit
Admission: RE | Admit: 2019-07-03 | Discharge: 2019-07-03 | Disposition: A | Discharge status: Home / Self Care | Payer: BC Managed Care – PPO | Source: Ambulatory Visit | Attending: Radiation Oncology | Admitting: Radiation Oncology

## 2019-07-03 DIAGNOSIS — C50511 Malignant neoplasm of lower-outer quadrant of right female breast: Secondary | ICD-10-CM

## 2019-07-03 MED ORDER — ALRA NON-METALLIC DEODORANT (RAD-ONC)
1.0000 "application " | Freq: Once | TOPICAL | Status: AC
  Administered 2019-07-03: 1 via TOPICAL

## 2019-07-03 MED ORDER — SONAFINE EX EMUL
1.0000 "application " | Freq: Two times a day (BID) | CUTANEOUS | Status: DC
  Administered 2019-07-03: 1 via TOPICAL

## 2019-07-04 ENCOUNTER — Other Ambulatory Visit: Payer: Self-pay

## 2019-07-04 ENCOUNTER — Ambulatory Visit
Admission: RE | Admit: 2019-07-04 | Discharge: 2019-07-04 | Disposition: A | Discharge status: Home / Self Care | Payer: BC Managed Care – PPO | Source: Ambulatory Visit | Attending: Radiation Oncology | Admitting: Radiation Oncology

## 2019-07-04 ENCOUNTER — Encounter: Payer: Self-pay | Admitting: Gynecology

## 2019-07-04 DIAGNOSIS — C50511 Malignant neoplasm of lower-outer quadrant of right female breast: Secondary | ICD-10-CM | POA: Diagnosis not present

## 2019-07-05 ENCOUNTER — Other Ambulatory Visit: Payer: Self-pay

## 2019-07-05 ENCOUNTER — Ambulatory Visit
Admission: RE | Admit: 2019-07-05 | Discharge: 2019-07-05 | Disposition: A | Discharge status: Home / Self Care | Payer: BC Managed Care – PPO | Source: Ambulatory Visit | Attending: Radiation Oncology | Admitting: Radiation Oncology

## 2019-07-05 DIAGNOSIS — C50511 Malignant neoplasm of lower-outer quadrant of right female breast: Secondary | ICD-10-CM | POA: Diagnosis not present

## 2019-07-06 ENCOUNTER — Telehealth: Payer: Self-pay | Admitting: *Deleted

## 2019-07-06 ENCOUNTER — Other Ambulatory Visit: Payer: Self-pay

## 2019-07-06 ENCOUNTER — Ambulatory Visit
Admission: RE | Admit: 2019-07-06 | Discharge: 2019-07-06 | Disposition: A | Discharge status: Home / Self Care | Payer: BC Managed Care – PPO | Source: Ambulatory Visit | Attending: Radiation Oncology | Admitting: Radiation Oncology

## 2019-07-06 DIAGNOSIS — C50511 Malignant neoplasm of lower-outer quadrant of right female breast: Secondary | ICD-10-CM | POA: Diagnosis not present

## 2019-07-06 NOTE — Telephone Encounter (Signed)
Received PA request from surescripts on ajovy asking for PA on this medication.  I LMVM for her to call back, is she using ajovy?  Using savings card?

## 2019-07-07 ENCOUNTER — Ambulatory Visit
Admission: RE | Admit: 2019-07-07 | Discharge: 2019-07-07 | Disposition: A | Discharge status: Home / Self Care | Payer: BC Managed Care – PPO | Source: Ambulatory Visit | Attending: Radiation Oncology | Admitting: Radiation Oncology

## 2019-07-07 ENCOUNTER — Encounter: Payer: Self-pay | Admitting: *Deleted

## 2019-07-07 ENCOUNTER — Encounter: Payer: Self-pay | Admitting: Family Medicine

## 2019-07-07 DIAGNOSIS — C50511 Malignant neoplasm of lower-outer quadrant of right female breast: Secondary | ICD-10-CM | POA: Diagnosis not present

## 2019-07-10 ENCOUNTER — Ambulatory Visit
Admission: RE | Admit: 2019-07-10 | Discharge: 2019-07-10 | Disposition: A | Discharge status: Home / Self Care | Payer: BC Managed Care – PPO | Source: Ambulatory Visit | Attending: Radiation Oncology | Admitting: Radiation Oncology

## 2019-07-10 DIAGNOSIS — C50511 Malignant neoplasm of lower-outer quadrant of right female breast: Secondary | ICD-10-CM | POA: Diagnosis not present

## 2019-07-11 ENCOUNTER — Other Ambulatory Visit: Payer: Self-pay

## 2019-07-11 ENCOUNTER — Ambulatory Visit
Admission: RE | Admit: 2019-07-11 | Discharge: 2019-07-11 | Disposition: A | Discharge status: Home / Self Care | Payer: BC Managed Care – PPO | Source: Ambulatory Visit | Attending: Radiation Oncology | Admitting: Radiation Oncology

## 2019-07-11 DIAGNOSIS — C50511 Malignant neoplasm of lower-outer quadrant of right female breast: Secondary | ICD-10-CM | POA: Diagnosis not present

## 2019-07-12 ENCOUNTER — Ambulatory Visit
Admission: RE | Admit: 2019-07-12 | Discharge: 2019-07-12 | Disposition: A | Discharge status: Home / Self Care | Payer: BC Managed Care – PPO | Source: Ambulatory Visit | Attending: Radiation Oncology | Admitting: Radiation Oncology

## 2019-07-12 ENCOUNTER — Other Ambulatory Visit: Payer: Self-pay

## 2019-07-12 DIAGNOSIS — C50511 Malignant neoplasm of lower-outer quadrant of right female breast: Secondary | ICD-10-CM | POA: Diagnosis not present

## 2019-07-13 ENCOUNTER — Other Ambulatory Visit: Payer: Self-pay

## 2019-07-13 ENCOUNTER — Ambulatory Visit
Admission: RE | Admit: 2019-07-13 | Discharge: 2019-07-13 | Disposition: A | Discharge status: Home / Self Care | Payer: BC Managed Care – PPO | Source: Ambulatory Visit | Attending: Radiation Oncology | Admitting: Radiation Oncology

## 2019-07-13 DIAGNOSIS — Z170 Estrogen receptor positive status [ER+]: Secondary | ICD-10-CM | POA: Insufficient documentation

## 2019-07-13 DIAGNOSIS — Z510 Encounter for antineoplastic radiation therapy: Secondary | ICD-10-CM | POA: Diagnosis not present

## 2019-07-13 DIAGNOSIS — C50511 Malignant neoplasm of lower-outer quadrant of right female breast: Secondary | ICD-10-CM | POA: Diagnosis present

## 2019-07-14 ENCOUNTER — Ambulatory Visit
Admission: RE | Admit: 2019-07-14 | Discharge: 2019-07-14 | Disposition: A | Discharge status: Home / Self Care | Payer: BC Managed Care – PPO | Source: Ambulatory Visit | Attending: Radiation Oncology | Admitting: Radiation Oncology

## 2019-07-14 ENCOUNTER — Other Ambulatory Visit: Payer: Self-pay

## 2019-07-14 DIAGNOSIS — C50511 Malignant neoplasm of lower-outer quadrant of right female breast: Secondary | ICD-10-CM | POA: Diagnosis not present

## 2019-07-17 ENCOUNTER — Ambulatory Visit
Admission: RE | Admit: 2019-07-17 | Discharge: 2019-07-17 | Disposition: A | Discharge status: Home / Self Care | Payer: BC Managed Care – PPO | Source: Ambulatory Visit | Attending: Radiation Oncology | Admitting: Radiation Oncology

## 2019-07-17 ENCOUNTER — Other Ambulatory Visit: Payer: Self-pay

## 2019-07-17 DIAGNOSIS — C50511 Malignant neoplasm of lower-outer quadrant of right female breast: Secondary | ICD-10-CM | POA: Diagnosis not present

## 2019-07-18 ENCOUNTER — Ambulatory Visit
Admission: RE | Admit: 2019-07-18 | Discharge: 2019-07-18 | Disposition: A | Discharge status: Home / Self Care | Payer: BC Managed Care – PPO | Source: Ambulatory Visit | Attending: Radiation Oncology | Admitting: Radiation Oncology

## 2019-07-18 DIAGNOSIS — C50511 Malignant neoplasm of lower-outer quadrant of right female breast: Secondary | ICD-10-CM | POA: Diagnosis not present

## 2019-07-19 ENCOUNTER — Other Ambulatory Visit: Payer: Self-pay

## 2019-07-19 ENCOUNTER — Ambulatory Visit
Admission: RE | Admit: 2019-07-19 | Discharge: 2019-07-19 | Disposition: A | Discharge status: Home / Self Care | Payer: BC Managed Care – PPO | Source: Ambulatory Visit | Attending: Radiation Oncology | Admitting: Radiation Oncology

## 2019-07-19 DIAGNOSIS — C50511 Malignant neoplasm of lower-outer quadrant of right female breast: Secondary | ICD-10-CM | POA: Diagnosis not present

## 2019-07-20 ENCOUNTER — Other Ambulatory Visit: Payer: Self-pay

## 2019-07-20 ENCOUNTER — Ambulatory Visit
Admission: RE | Admit: 2019-07-20 | Discharge: 2019-07-20 | Disposition: A | Discharge status: Home / Self Care | Payer: BC Managed Care – PPO | Source: Ambulatory Visit | Attending: Radiation Oncology | Admitting: Radiation Oncology

## 2019-07-20 DIAGNOSIS — C50511 Malignant neoplasm of lower-outer quadrant of right female breast: Secondary | ICD-10-CM | POA: Diagnosis not present

## 2019-07-21 ENCOUNTER — Other Ambulatory Visit: Payer: Self-pay

## 2019-07-21 ENCOUNTER — Ambulatory Visit
Admission: RE | Admit: 2019-07-21 | Discharge: 2019-07-21 | Disposition: A | Discharge status: Home / Self Care | Payer: BC Managed Care – PPO | Source: Ambulatory Visit | Attending: Radiation Oncology | Admitting: Radiation Oncology

## 2019-07-21 DIAGNOSIS — C50511 Malignant neoplasm of lower-outer quadrant of right female breast: Secondary | ICD-10-CM | POA: Diagnosis not present

## 2019-07-22 NOTE — Progress Notes (Signed)
Patient Care Team: Marda Stalker, PA-C as PCP - General (Family Medicine) Mauro Kaufmann, RN as Oncology Nurse Navigator Rockwell Germany, RN as Oncology Nurse Navigator Fanny Skates, MD as Consulting Physician (General Surgery) Nicholas Lose, MD as Consulting Physician (Hematology and Oncology) Eppie Gibson, MD as Attending Physician (Radiation Oncology)  DIAGNOSIS:    ICD-10-CM   1. Malignant neoplasm of lower-outer quadrant of right breast of female, estrogen receptor positive ([place])  C50.511    Z17.0     SUMMARY OF ONCOLOGIC HISTORY: Oncology History  Malignant neoplasm of lower-outer quadrant of right breast of female, estrogen receptor positive ([place])  04/25/2019 Initial Diagnosis   Right breast mass identified on clinical examination. Mammogram showed 1.6cm indeterminate mass in the lower outer right breast, no abnormal axillary lymph nodes. 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   05/03/2019 Cancer Staging   Staging form: Breast, AJCC 8th Edition - Clinical stage from 05/03/2019: Stage IA (cT1c, cN0, cM0, G2, ER+, PR+, HER2-) - Signed by Nicholas Lose, MD on 05/03/2019    Genetic Testing   4 Variants of Uncertain Significance: VUS in BMPR1A called c.565T>C, VUS in NBN called c.724>A, VUS in NTHL1 called c.704G>A, and VUS in POLE c.1847G>A identified on the [facility] Breast Cancer STAT Panel  + Common Hereditary Cancers Panel. The STAT Breast cancer panel offered by [facility] includes sequencing and rearrangement analysis for the following 9 genes:  ATM, BRCA1, BRCA2, CDH1, CHEK2, PALB2, PTEN, STK11 and TP53.   The Common Hereditary Cancers Panel offered by [facility] includes sequencing and/or deletion duplication testing of the following 47 genes: APC, ATM, AXIN2, BARD1, BMPR1A, BRCA1, BRCA2, BRIP1, CDH1, CDKN2A (p14ARF), CDKN2A (p16INK4a), CKD4, CHEK2, CTNNA1, DICER1, EPCAM (Deletion/duplication testing only), GREM1 (promoter region  deletion/duplication testing only), KIT, MEN1, MLH1, MSH2, MSH3, MSH6, MUTYH, NBN, NF1, NHTL1, PALB2, PDGFRA, PMS2, POLD1, POLE, PTEN, RAD50, RAD51C, RAD51D, SDHB, SDHC, SDHD, SMAD4, SMARCA4. STK11, TP53, TSC1, TSC2, and VHL.  The following genes were evaluated for sequence changes only: SDHA and HOXB13 c.251G>A variant only. The report date is 05/09/2019.   05/24/2019 Surgery   Right lumpectomy [name]): IDC, grade 1, 1.1cm, with LCIS and high grade DCIS, clear margins, and no evidence of malignancy in 4 lymph nodes.    05/24/2019 Cancer Staging   Staging form: Breast, AJCC 8th Edition - Pathologic stage from 05/24/2019: Stage IA (pT1c, pN0, cM0, G1, ER+, PR+, HER2-) - Signed by Gardenia Phlegm, NP on 06/07/2019   05/24/2019 Oncotype testing   Oncotype score of 18/5%   06/30/2019 -  Radiation Therapy   Adjuvant radiation     CHIEF COMPLIANT: Follow-up to discuss further treatment  INTERVAL HISTORY: Holly Jenkins is a 43 y.o. with above-mentioned history of right breast cancer treated with lumpectomy and who is currently undergoing radiation. She presents to the clinic today for follow-up to discuss further treatment.   REVIEW OF SYSTEMS:   Constitutional: Denies fevers, chills or abnormal weight loss Eyes: Denies blurriness of vision Ears, nose, mouth, throat, and face: Denies mucositis or sore throat Respiratory: Denies cough, dyspnea or wheezes Cardiovascular: Denies palpitation, chest discomfort Gastrointestinal: Denies nausea, heartburn or change in bowel habits Skin: Denies abnormal skin rashes Lymphatics: Denies new lymphadenopathy or easy bruising Neurological: Denies numbness, tingling or new weaknesses Behavioral/Psych: Mood is stable, no new changes  Extremities: No lower extremity edema Breast: denies any pain or lumps or nodules in either breasts All other systems were reviewed with the  patient and are negative.  I have reviewed the past medical history, past  surgical history, social history and family history with the patient and they are unchanged from previous note.  ALLERGIES:  is allergic to dust mite extract; eggs or egg-derived products; gluten meal; thimerosal; peanut-containing drug products; and sulfites.  MEDICATIONS:  Current Outpatient Medications  Medication Sig Dispense Refill   AJOVY 225 MG/1.5ML SOSY INJECT 1 syringe SUBCUTANEOUSLY EVERY 30 DAYS 1.5 mL 4   Ascorbic Acid (VITA-C PO) Take 1 tablet by mouth at bedtime.      Bilberry 100 MG CAPS Take 1 capsule by mouth. Daily at night     BLACK CURRANT SEED OIL PO Take 1 tablet by mouth 2 (two) times daily.     botulinum toxin Type A (BOTOX) 100 units SOLR injection Inject IM every 3 months into head and neck muscles by provider in the office 2 vial 3   clonazePAM (KLONOPIN) 0.5 MG tablet Take 0.5 mg by mouth 2 (two) times daily as needed for anxiety.     Diclofenac Potassium (CAMBIA) 50 MG PACK MIX 1 PACKET WITH 1-2 OUNCES OF WATER. DRINK IMMEDIATELY AS A SINGLE DOSE 9 each 11   etonogestrel-ethinyl estradiol (NUVARING) 0.12-0.015 MG/24HR vaginal ring Insert vaginally and leave in place for 4 consecutive weeks, then replace with new one. 3 each 4   HYDROcodone-acetaminophen (NORCO) 5-325 MG tablet Take 1-2 tablets by mouth every 6 (six) hours as needed for moderate pain or severe pain. 20 tablet 0   Melatonin-Pyridoxine (MELATIN PO) Take 1 tablet by mouth daily.     Menaquinone-7 (VITAMIN K2 PO) Take 1 tablet by mouth daily.     metoCLOPramide (REGLAN) 10 MG tablet Take 1 tablet (10 mg total) by mouth every 6 (six) hours as needed for nausea. May take for migraine. 30 tablet 6   Multiple Vitamins-Minerals (ZINC PO) Take 1 tablet by mouth 2 (two) times daily before a meal.      NON FORMULARY D-HIST  2 TABS PO QD     NON FORMULARY Take 1 tablet by mouth. ALA     NON FORMULARY Take 1 tablet by mouth 2 (two) times daily before a meal. [place]     NON FORMULARY Take 1  tablet by mouth daily. Trichromium daily with food     NON FORMULARY Take 2 tablets by mouth every morning. A-Drenal     NON FORMULARY Take 1 tablet by mouth 2 (two) times daily before a meal. L-Carnatine  Two times daily (morning and afternoon)     NON FORMULARY Take 1 tablet by mouth. Methylation Complete 1 tab daily in the afternoon under the tongue     NON FORMULARY Take 4 tablets by mouth. Mega IgG 2000 daily in the afternoon     Omega-3 Fatty Acids (OMEGA 3 PO) Take 1 tablet by mouth daily.     ondansetron (ZOFRAN) 4 MG tablet Take acutely at onset of migraine or nausea. As needed.Maximum 4x a day 20 tablet 6   OVER THE COUNTER MEDICATION Take 1 tablet by mouth daily. Vitamin D3     OVER THE COUNTER MEDICATION Take 1 tablet by mouth. Iron one tablet daily in the afternoon     PHOSPHATIDYLSERINE PO Take 2 tablets by mouth 2 (two) times daily.     propranolol (INDERAL) 10 MG tablet Take acutely for migraine 60 tablet 6   Thyroid (NATURE-THROID PO) Take by mouth daily.     TURMERIC PO Take by mouth daily.  VITAMIN B COMPLEX-C PO Take 1 tablet by mouth daily.     No current facility-administered medications for this visit.     PHYSICAL EXAMINATION: ECOG PERFORMANCE STATUS: 1 - Symptomatic but completely ambulatory  Vitals:   07/24/19 1514  BP: 112/68  Pulse: 90  Resp: 18  Temp: 98.2 F (36.8 C)  SpO2: 100%   Filed Weights   07/24/19 1514  Weight: 145 lb 9.6 oz (66 kg)    GENERAL: alert, no distress and comfortable SKIN: skin color, texture, turgor are normal, no rashes or significant lesions EYES: normal, Conjunctiva are pink and non-injected, sclera clear OROPHARYNX: no exudate, no erythema and lips, buccal mucosa, and tongue normal  NECK: supple, thyroid normal size, non-tender, without nodularity LYMPH: no palpable lymphadenopathy in the cervical, axillary or inguinal LUNGS: clear to auscultation and percussion with normal breathing effort HEART: regular  rate & rhythm and no murmurs and no lower extremity edema ABDOMEN: abdomen soft, non-tender and normal bowel sounds MUSCULOSKELETAL: no cyanosis of digits and no clubbing  NEURO: alert & oriented x 3 with fluent speech, no focal motor/sensory deficits EXTREMITIES: No lower extremity edema  LABORATORY DATA:  I have reviewed the data as listed CMP Latest Ref Rng & Units 05/03/2019  Glucose 70 - 99 mg/dL 98  BUN 6 - 20 mg/dL 14  Creatinine 0.44 - 1.00 mg/dL 0.73  Sodium 135 - 145 mmol/L 139  Potassium 3.5 - 5.1 mmol/L 4.2  Chloride 98 - 111 mmol/L 105  CO2 22 - 32 mmol/L 24  Calcium 8.9 - 10.3 mg/dL 9.3  Total Protein 6.5 - 8.1 g/dL 7.1  Total Bilirubin 0.3 - 1.2 mg/dL 0.4  Alkaline Phos 38 - 126 U/L 39  AST 15 - 41 U/L 13(L)  ALT 0 - 44 U/L 11    Lab Results  Component Value Date   WBC 7.4 05/03/2019   HGB 12.7 05/03/2019   HCT 38.8 05/03/2019   MCV 87.8 05/03/2019   PLT 256 05/03/2019   NEUTROABS 5.3 05/03/2019    ASSESSMENT & PLAN:  Malignant neoplasm of lower-outer quadrant of right breast of female, estrogen receptor positive ([place]) receptor positive ([place]) 04/25/2019:Right breast mass identified on clinical examination. Mammogram showed 1.6cm indeterminate mass in the lower outer right breast, no abnormal axillary lymph nodes. 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  Recommendations: 1. Breast conserving surgery 05/24/2019 2. Oncotype DX testing to determine if chemotherapy would be of any benefit followed by 3. Adjuvant radiation therapy 06/30/2019- 4. Adjuvant antiestrogen therapywith tamoxifen 20 mg daily x10 years ---------------------------------------------------------------------------------------------------------------------------- 05/24/2019 right lumpectomy: invasive ductal carcinoma, grade 1, 1.1cm, with LCIS and high grade DCIS, clear margins, and no evidence of malignancy in 4 lymph nodes. T1CN0 stage Ia  Adjuvant radiation  06/30/2019-07/28/2019  Treatment plan: Adjuvant antiestrogen therapy with tamoxifen 20 mg daily x10 years (patient will start at 10 mg daily and increase to 20 if she tolerates it)  Tamoxifen counseling:We discussed the risks and benefits of tamoxifen. These include but not limited to insomnia, hot flashes, mood changes, vaginal dryness, and weight gain. Although rare, serious side effects including endometrial cancer, risk of blood clots were also discussed. We strongly believe that the benefits far outweigh the risks. Patient understands these risks and consented to starting treatment. Planned treatment duration is 10 years.  Return to clinic in 3 months for survivorship care plan visit  No orders of the defined types were placed in this encounter.  The  patient has a good understanding of the overall plan. she agrees with it. she will call with any problems that may develop before the next visit here.  Nicholas Lose, MD 07/24/2019  Julious Oka Dorshimer am acting as scribe for Dr. Nicholas Lose.  I have reviewed the above documentation for accuracy and completeness, and I agree with the above.

## 2019-07-24 ENCOUNTER — Ambulatory Visit
Admission: RE | Admit: 2019-07-24 | Discharge: 2019-07-24 | Disposition: A | Discharge status: Home / Self Care | Payer: BC Managed Care – PPO | Source: Ambulatory Visit | Attending: Radiation Oncology | Admitting: Radiation Oncology

## 2019-07-24 ENCOUNTER — Inpatient Hospital Stay: Payer: BC Managed Care – PPO | Attending: Hematology and Oncology | Admitting: Hematology and Oncology

## 2019-07-24 ENCOUNTER — Other Ambulatory Visit: Payer: Self-pay

## 2019-07-24 DIAGNOSIS — Z793 Long term (current) use of hormonal contraceptives: Secondary | ICD-10-CM | POA: Insufficient documentation

## 2019-07-24 DIAGNOSIS — C50511 Malignant neoplasm of lower-outer quadrant of right female breast: Secondary | ICD-10-CM

## 2019-07-24 DIAGNOSIS — Z170 Estrogen receptor positive status [ER+]: Secondary | ICD-10-CM | POA: Diagnosis not present

## 2019-07-24 DIAGNOSIS — Z79899 Other long term (current) drug therapy: Secondary | ICD-10-CM | POA: Insufficient documentation

## 2019-07-24 MED ORDER — SONAFINE EX EMUL
1.0000 "application " | Freq: Two times a day (BID) | CUTANEOUS | Status: DC
  Administered 2019-07-24: 1 via TOPICAL

## 2019-07-24 MED ORDER — TAMOXIFEN CITRATE 20 MG PO TABS: 20.0000 mg | ORAL_TABLET | Freq: Every day | ORAL | 3 refills | Status: DC

## 2019-07-24 MED ORDER — FIRST-TESTOSTERONE MC 2 % TD CREA: 0.2500 g | TOPICAL_CREAM | Freq: Every day | TRANSDERMAL | 0 refills | Status: DC

## 2019-07-24 NOTE — Assessment & Plan Note (Signed)
receptor positive ([place]) 04/25/2019:Right breast mass identified on clinical examination. Mammogram showed 1.6cm indeterminate mass in the lower outer right breast, no abnormal axillary lymph nodes. 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  Recommendations: 1. Breast conserving surgery 05/24/2019 2. Oncotype DX testing to determine if chemotherapy would be of any benefit followed by 3. Adjuvant radiation therapy 06/30/2019- 4. Adjuvant antiestrogen therapywith tamoxifen 20 mg daily x10 years ---------------------------------------------------------------------------------------------------------------------------- 05/24/2019 right lumpectomy: invasive ductal carcinoma, grade 1, 1.1cm, with LCIS and high grade DCIS, clear margins, and no evidence of malignancy in 4 lymph nodes. T1CN0 stage Ia  Adjuvant radiation 06/30/2019-  Treatment plan: Adjuvant antiestrogen therapy with tamoxifen 20 mg daily x10 years Tamoxifen counseling:We discussed the risks and benefits of tamoxifen. These include but not limited to insomnia, hot flashes, mood changes, vaginal dryness, and weight gain. Although rare, serious side effects including endometrial cancer, risk of blood clots were also discussed. We strongly believe that the benefits far outweigh the risks. Patient understands these risks and consented to starting treatment. Planned treatment duration is 10 years.  Return to clinic in 3 months for survivorship care plan visit

## 2019-07-25 ENCOUNTER — Telehealth: Payer: Self-pay | Admitting: Hematology and Oncology

## 2019-07-25 ENCOUNTER — Other Ambulatory Visit: Payer: Self-pay

## 2019-07-25 ENCOUNTER — Ambulatory Visit
Admission: RE | Admit: 2019-07-25 | Discharge: 2019-07-25 | Disposition: A | Discharge status: Home / Self Care | Payer: BC Managed Care – PPO | Source: Ambulatory Visit | Attending: Radiation Oncology | Admitting: Radiation Oncology

## 2019-07-25 ENCOUNTER — Telehealth: Payer: Self-pay | Admitting: *Deleted

## 2019-07-25 ENCOUNTER — Other Ambulatory Visit: Payer: Self-pay | Admitting: Radiation Oncology

## 2019-07-25 DIAGNOSIS — C50511 Malignant neoplasm of lower-outer quadrant of right female breast: Secondary | ICD-10-CM

## 2019-07-25 NOTE — Telephone Encounter (Signed)
I talk with patient regarding schedule  

## 2019-07-25 NOTE — Telephone Encounter (Signed)
CALLED PATIENT TO INFORM OF PT APPT. FOR 08-15-19 - ARRIVAL TIME- 12:55 PM , SPOKE WITH PATIENT AND SHE IS AWARE OF THIS APPT. DATE AND TIME

## 2019-07-26 ENCOUNTER — Encounter: Payer: Self-pay | Admitting: Radiation Oncology

## 2019-07-26 ENCOUNTER — Ambulatory Visit
Admission: RE | Admit: 2019-07-26 | Discharge: 2019-07-26 | Disposition: A | Discharge status: Home / Self Care | Payer: BC Managed Care – PPO | Source: Ambulatory Visit | Attending: Radiation Oncology | Admitting: Radiation Oncology

## 2019-07-26 ENCOUNTER — Other Ambulatory Visit: Payer: Self-pay

## 2019-07-26 ENCOUNTER — Encounter: Payer: Self-pay | Admitting: *Deleted

## 2019-07-26 DIAGNOSIS — C50511 Malignant neoplasm of lower-outer quadrant of right female breast: Secondary | ICD-10-CM | POA: Diagnosis not present

## 2019-07-31 ENCOUNTER — Other Ambulatory Visit: Payer: Self-pay | Admitting: Neurology

## 2019-08-15 ENCOUNTER — Ambulatory Visit: Payer: BC Managed Care – PPO | Attending: Radiation Oncology | Admitting: Physical Therapy

## 2019-08-15 ENCOUNTER — Other Ambulatory Visit: Payer: Self-pay

## 2019-08-15 DIAGNOSIS — I890 Lymphedema, not elsewhere classified: Secondary | ICD-10-CM | POA: Diagnosis present

## 2019-08-15 DIAGNOSIS — Z483 Aftercare following surgery for neoplasm: Secondary | ICD-10-CM

## 2019-08-15 NOTE — Therapy (Signed)
[place], [place], [address] Phone: [phone]   Fax:  [phone]  Physical Therapy Evaluation  Patient Details  Name: Holly Jenkins MRN: [id number] Date of Birth: [birth year] Referring Provider (PT): Dr. Isidore Moos  (also Dr. [name] and Dr. Lindi Adie)   Encounter Date: 08/15/2019  PT End of Session - 08/15/19 1620    Visit Number  1    Number of Visits  9    Date for PT Re-Evaluation  09/14/19    PT Start Time  1300    PT Stop Time  1345    PT Time Calculation (min)  45 min    Activity Tolerance  Patient tolerated treatment well    Behavior During Therapy  [facility] for tasks assessed/performed       Past Medical History:  Diagnosis Date  . Anxiety   . Breast cancer ([place]) 2020  . Fibromyalgia   . 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 Migraines   . Mold exposure    toxicity    Past Surgical History:  Procedure Laterality Date  . BREAST LUMPECTOMY WITH RADIOACTIVE SEED AND SENTINEL LYMPH NODE BIOPSY Right 05/24/2019   Procedure: RIGHT BREAST LUMPECTOMY WITH RADIOACTIVE SEED AND RIGHT AXILLARY DEEP SENTINEL LYMPH NODE BIOPSY, INJECT BLUE DYE;  Surgeon: Fanny Skates, MD;  Location: [facility];  Service: General;  Laterality: Right;  . CERVICAL SPINE SURGERY     c5-c6-c7 fusion with disc transplants    There were no vitals filed for this visit.   Subjective Assessment - 08/15/19 1304    Subjective  " I'm having a lot of heaviness like someone is squeezing it....deep in armpit " She had no skin opening with radiation. she is having pain from a sports bra in the axilla    Pertinent History  right breast cancer diagnosed on 04/25/2019 with right lumpectomy on 05/24/2019 with 0/4 nodes removed followed by radiation from 9/18- 07/26/2019 she will have taxoifen but no other chemo    Patient Stated Goals  to get rid o the pain and swelling iher armpit    Currently in Pain?  No/denies         [facility] PT Assessment - 08/15/19 0001      Assessment   Medical Diagnosis  right breast cancer     Referring Provider (PT)  Dr. Isidore Moos    also Dr. [name] and Dr. Lindi Adie   Onset Date/Surgical Date  04/25/19    Hand Dominance  Right      Precautions   Precautions  Other (comment)    Precaution Comments  lymphedema risk       Restrictions   Weight Bearing Restrictions  No      Balance Screen   Has the patient fallen in the past 6 months  No    Has the patient had a decrease in activity level because of a fear of falling?   No    Is the patient reluctant to leave their home because of a fear of falling?   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  Leisure  prior to dx exercises 4 days a week with gym, yoga  and [employer]      Cognition   Overall Cognitive Status  Within Functional Limits for tasks assessed      Observation/Other Assessments   Observations  pt has visible fullness in left axilla at axillary node scar and at inferior breast below incision     Skin Integrity  well headed, no discoloration     Quick DASH   15.91      Sensation   Light Touch  Not tested      Coordination   Gross Motor Movements are Fluid and Coordinated  Yes      Posture/Postural Control   Posture/Postural Control  Postural limitations    Postural Limitations  Forward head      ROM / Strength   AROM / PROM / Strength  AROM;Strength      AROM   Overall AROM   Within functional limits for tasks performed    Right Shoulder Flexion  170 Degrees    Right Shoulder ABduction  170 Degrees    Right Shoulder External Rotation  90 Degrees      Strength   Overall  Strength  Within functional limits for tasks performed    Overall Strength Comments  pt thin but with good muscle tone       Palpation   Palpation comment  firmess in right axilla at area of node incision and also along right lateral chest and breast         LYMPHEDEMA/ONCOLOGY QUESTIONNAIRE - 08/15/19 1615      Type   Cancer Type  right breast       Surgeries   Lumpectomy Date  05/24/19    Number Lymph Nodes Removed  4      Treatment   Active Chemotherapy Treatment  No    Past Chemotherapy Treatment  No    Active Radiation Treatment  No    Past Radiation Treatment  Yes    Date  07/26/19    Body Site  right upper quadrant     Current Hormone Treatment  Yes    Date  08/13/19    Drug Name  tamoxifen      What other symptoms do you have   Are you Having Heaviness or Tightness  Yes    Are you having Pain  Yes    Are you having pitting edema  Yes    Body Site  right breast    Is it Hard or Difficult finding clothes that fit  Yes   bras are very uncomfortable    Do you have infections  No    Is there Decreased scar mobility  Yes    [place] Sign  No    Other Symptoms  firmness in axilla       Right Upper Extremity Lymphedema   15 cm Proximal to Olecranon Process  26 cm    Olecranon Process  24 cm    15 cm Proximal to Ulnar Styloid Process  23 cm    Just Proximal to Ulnar Styloid Process  16 cm    Across Hand at [employer]  19.5 cm    At [place] of 2nd Digit  6 cm      Left Upper Extremity Lymphedema   15 cm Proximal to Olecranon Process  26 cm    Olecranon Process  24 cm    15 cm Proximal to Ulnar Styloid Process  23 cm    Just Proximal to Ulnar Styloid Process  16 cm    Across Hand at [employer]  19 cm    At [place] of 2nd Digit  6 cm          Quick Dash - 08/15/19 0001    Open a tight or new jar  Mild difficulty    Do heavy household chores (wash walls, wash floors)  Mild difficulty    Carry a shopping bag or briefcase  No difficulty    Wash your back   No difficulty    Use a knife to cut food  No difficulty    Recreational activities in which you take some force or impact through your arm, shoulder, or hand (golf, hammering, tennis)  Mild difficulty    During the past week, to what extent has your arm, shoulder or hand problem interfered with your normal social activities with family, friends, neighbors, or groups?  Not at all    During the past week, to what extent has your arm, shoulder or hand problem limited your work or other regular daily activities  Slightly    Arm, shoulder, or hand pain.  Moderate    Tingling (pins and needles) in your arm, shoulder, or hand  None    Difficulty Sleeping  Mild difficulty    DASH Score  15.91 %        Objective measurements completed on examination: See above findings.      [place] Adult PT Treatment/Exercise - 08/15/19 0001      Self-Care   Self-Care  Other Self-Care Comments    Other Self-Care Comments   gave pt chip pack to wear in right axilla in sports bra and showed her examples of prarie bra... she would benefit from the prarie vida  fgave pt information about ABC class              PT Education - 08/15/19 1619    Education Details  about compression bras and how to use chip pack    Person(s) Educated  Patient    Methods  Explanation;Demonstration    Comprehension  Verbalized understanding          PT Long Term Goals - 08/15/19 1629      PT LONG TERM GOAL #1   Title  Pt wil report the fullness in her left axilla is dereased by 75%    Time  4    Period  Weeks    Status  New      PT LONG TERM GOAL #2   Title  Pt will be independent in self manual lymph drainage and use of compression to manage fullness in left axilla and breast 4    Period  Weeks    Status  New      PT LONG TERM GOAL #3   Title  Pt will be independent in a gradually progressive exercise program    Time  4    Period  Weeks    Status  New      PT LONG TERM GOAL #4   Title  Pt will decrease Quick  DASH score to < 7 indicating an improvment in function of left arm    Baseline  15.91    Time  4    Period  Weeks    Status  New             Plan - 08/15/19 1620    Clinical Impression Statement  Pt presents to PT after right breast lumpectomy with 4 axillary nodes removed and after completion of radiation.  She has firm area in left axilla at site of node removal and lymphedema in her right breast.  She has full shoulder ROM and strength and no increase in the circumferences of her arms.  She will benefit from PT to  manage lymphedema and decrease the fullness and pain in her axilla She has found her sports bras to be painful. Sent script for Prarie vida compression bra and prophylactic compression sleeve and gauntlet as pt does have a risk of lymphedema    Personal Factors and Comorbidities  Comorbidity 2    Comorbidities  surgery, radiation    Examination-Activity Limitations  Other   wearing clothes as they can be painful   Stability/Clinical Decision Making  Stable/Uncomplicated    Clinical Decision Making  Low    Rehab Potential  Excellent    PT Frequency  2x / week    PT Duration  4 weeks    PT Treatment/Interventions  ADLs/Self Care Home Management;Therapeutic activities;Therapeutic exercise;Neuromuscular re-education;Patient/family education;Orthotic Fit/Training;Manual techniques;Manual lymph drainage;Compression bandaging;Scar mobilization;Passive range of motion;Taping    PT Next Visit Plan  check to see if script is back (and make a copy for pt as she may need one for bra and one for sleeve if she goes to 2 different places)  check to see how chip pack worked in her bra.  Performa and Teach MLD with focus on left axilla and left breast. assist with compression bra and upgrade foam as needed  see if pt signed up for ABC class. prior to discharge issue Strength ABC program    Recommended Other Services  script sent to Dr. Lindi Adie for bra/sleeve/?gaunlet    Consulted and Agree  with Plan of Care  Patient       Patient will benefit from skilled therapeutic intervention in order to improve the following deficits and impairments:  Increased edema, Impaired perceived functional ability, Decreased scar mobility, Decreased knowledge of precautions, Decreased knowledge of use of DME, Increased fascial restricitons, Impaired UE functional use, Postural dysfunction, Pain  Visit Diagnosis: Aftercare following surgery for neoplasm - Plan: PT plan of care cert/re-cert  Lymphedema, not elsewhere classified - Plan: PT plan of care cert/re-cert     Problem List Patient Active Problem List   Diagnosis Date Noted  . Genetic testing 05/10/2019  . Malignant neoplasm of lower-outer quadrant of right breast of female, estrogen receptor positive ([place]) 04/25/2019  . Chronic migraine w/o aura w/o status migrainosus, not intractable 05/26/2016  . Cervical dystonia 05/26/2016   Donato Heinz. Owens Shark PT  Norwood Levo 08/15/2019, 4:34 PM  [place], [place], [address] Phone: [phone]   Fax:  [phone]  Name: Holly Jenkins MRN: [id number] Date of Birth: 12-06-1975

## 2019-08-15 NOTE — Patient Instructions (Signed)
First of all, check with your insurance company to see if provider is in network    [facility] (for wigs and compression sleeves / gloves/gauntlets )  [address] [place], [place] [address] [phone]  Will file some insurances --- call for appointment   [facility] (for mastectomy prosthetics and garments) [address] [place], [place] [address] [phone] Will file some insurances --- call for appointment  [facility]  [address]  [place], [place] [address] [phone] Lower extremity garments  [facility] [address] [place], [place]  [address] [phone]  Cathy Rubel ( Medicaid certified lymphedema fitter) [phone] [email or web address]  Melissa Meares  [facility]  [phone]  [facility] [address] [place], [place] [address] [phone]  Other Resources: National Lymphedema Network:  www.lymphnet.org www.Klosetraining.com for patient articles and self manual lymph drainage information www.lymphedemablog.com has informative articles.  www.compressionguru.com www.lymphedemaproducts.com www.brightlifedirect.com www.compressionguru.com 

## 2019-08-18 ENCOUNTER — Other Ambulatory Visit: Payer: Self-pay

## 2019-08-18 ENCOUNTER — Ambulatory Visit: Payer: BC Managed Care – PPO | Admitting: Physical Therapy

## 2019-08-18 ENCOUNTER — Encounter: Payer: Self-pay | Admitting: Physical Therapy

## 2019-08-18 DIAGNOSIS — Z483 Aftercare following surgery for neoplasm: Secondary | ICD-10-CM | POA: Diagnosis not present

## 2019-08-18 DIAGNOSIS — I890 Lymphedema, not elsewhere classified: Secondary | ICD-10-CM

## 2019-08-18 NOTE — Therapy (Signed)
[place], [place], [address] Phone: [phone]   Fax:  [phone]  Physical Therapy Treatment  Patient Details  Name: Holly Jenkins MRN: [id number] Date of Birth: 1976/09/21 Referring Provider (PT): Dr. Isidore Moos  (also Dr. [name] and Dr. Lindi Adie)   Encounter Date: 08/18/2019  PT End of Session - 08/18/19 1003    Visit Number  2    Number of Visits  9    Date for PT Re-Evaluation  09/14/19    PT Start Time  0910   pt arrived late   PT Stop Time  0952    PT Time Calculation (min)  42 min    Activity Tolerance  Patient tolerated treatment well    Behavior During Therapy  [facility] for tasks assessed/performed       Past Medical History:  Diagnosis Date  . Anxiety   . Breast cancer ([place]) 2020  . Fibromyalgia   . 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 Migraines   . Mold exposure    toxicity    Past Surgical History:  Procedure Laterality Date  . BREAST LUMPECTOMY WITH RADIOACTIVE SEED AND SENTINEL LYMPH NODE BIOPSY Right 05/24/2019   Procedure: RIGHT BREAST LUMPECTOMY WITH RADIOACTIVE SEED AND RIGHT AXILLARY DEEP SENTINEL LYMPH NODE BIOPSY, INJECT BLUE DYE;  Surgeon: Fanny Skates, MD;  Location: [facility];  Service: General;  Laterality: Right;  . CERVICAL SPINE SURGERY     c5-c6-c7 fusion with disc transplants    There were no vitals filed for this visit.  Subjective Assessment - 08/18/19 0911    Subjective  I did wear a sports bra with the chip pack for like 3 hours and then I was swollen and in pain for the next few days.    Pertinent History  right breast cancer diagnosed on 04/25/2019 with right lumpectomy on 05/24/2019 with 0/4 nodes removed followed by radiation from 9/18- 07/26/2019 she will have taxoifen but no other chemo    Patient Stated Goals  to get rid o the pain and swelling iher armpit    Currently in Pain?  Yes    Pain Score  2      Pain Location  Axilla    Pain Orientation  Right    Pain Descriptors / Indicators  Heaviness;Tightness    Pain Type  Surgical pain    Pain Onset  More than a month ago    Pain Frequency  Intermittent    Aggravating Factors   wearing sports bra    Pain Relieving Factors  not wearing bra or camisole    Effect of Pain on Daily Activities  unable to sleep on that side                       [facility] Adult PT Treatment/Exercise - 08/18/19 0001      Manual Therapy   Manual Therapy  Soft tissue mobilization;Myofascial release;Edema management    Edema Management  Issued script for compression bra and sleeve and glove and gave pt info to get measured    Soft tissue mobilization  along R breast just inferior to nipple to area of fibrosis and scar tissue with softening noted    Myofascial Release  to R axilla in area of tightness along tight band/cord with significant release noted                  PT Long  Term Goals - 08/15/19 1629      PT LONG TERM GOAL #1   Title  Pt wil report the fullness in her left axilla is dereased by 75%    Time  4    Period  Weeks    Status  New      PT LONG TERM GOAL #2   Title  Pt will be independent in self manual lymph drainage and use of compression to manage fullness in left axilla and breast 4    Period  Weeks    Status  New      PT LONG TERM GOAL #3   Title  Pt will be independent in a gradually progressive exercise program    Time  4    Period  Weeks    Status  New      PT LONG TERM GOAL #4   Title  Pt will decrease Quick DASH score to < 7 indicating an improvment in function of left arm    Baseline  15.91    Time  4    Period  Weeks    Status  New            Plan - 08/18/19 1000    Clinical Impression Statement  Pt reports she tried to wear her sports bra with the chip pack for 3 hours and then had pain in her right axilla for several days after that. Therapist palpated tight band in right axilla and spent  session doing myofascial release and soft tissue mobilization to the area to decrease tightness. Also performed soft tissue mobilization to right breast just inferior to nipple where increased fibrosis was palpated. Pt felt more loose by end of session. Issued script for pt to be fitted for compression bra, sleeve and glove.    PT Frequency  2x / week    PT Duration  4 weeks    PT Treatment/Interventions  ADLs/Self Care Home Management;Therapeutic activities;Therapeutic exercise;Neuromuscular re-education;Patient/family education;Orthotic Fit/Training;Manual techniques;Manual lymph drainage;Compression bandaging;Scar mobilization;Passive range of motion;Taping    PT Next Visit Plan  see if pt was measured for bra, sleeve and glove, see how pt felt after myofascial and soft tissue moblization and if this decreased discomfort, check to see how chip pack worked in her bra.  Perform and Teach MLD with focus on right axilla and left breast. assist with compression bra and upgrade foam as needed  see if pt signed up for ABC class. prior to discharge issue Strength ABC program    Consulted and Agree with Plan of Care  Patient       Patient will benefit from skilled therapeutic intervention in order to improve the following deficits and impairments:  Increased edema, Impaired perceived functional ability, Decreased scar mobility, Decreased knowledge of precautions, Decreased knowledge of use of DME, Increased fascial restricitons, Impaired UE functional use, Postural dysfunction, Pain  Visit Diagnosis: Aftercare following surgery for neoplasm  Lymphedema, not elsewhere classified     Problem List Patient Active Problem List   Diagnosis Date Noted  . Genetic testing 05/10/2019  . Malignant neoplasm of lower-outer quadrant of right breast of female, estrogen receptor positive ([place]) 04/25/2019  . Chronic migraine w/o aura w/o status migrainosus, not intractable 05/26/2016  . Cervical dystonia  05/26/2016    Allyson Sabal [facility] 08/18/2019, 10:04 AM  [place], [place], [address] Phone: [phone]   Fax:  [phone]  Name: Holly Jenkins MRN: [id number] Date of  Birth: 1976-09-01  Manus Gunning, PT 08/18/19 10:04 AM

## 2019-08-22 ENCOUNTER — Other Ambulatory Visit: Payer: Self-pay

## 2019-08-22 ENCOUNTER — Ambulatory Visit: Payer: BC Managed Care – PPO

## 2019-08-22 DIAGNOSIS — Z483 Aftercare following surgery for neoplasm: Secondary | ICD-10-CM

## 2019-08-22 DIAGNOSIS — I890 Lymphedema, not elsewhere classified: Secondary | ICD-10-CM

## 2019-08-22 NOTE — Therapy (Signed)
[place], [place], [address] Phone: [phone]   Fax:  [phone]  Physical Therapy Treatment  Patient Details  Name: Holly Jenkins MRN: [id number] Date of Birth: 1976/05/11 Referring Provider (PT): Dr. Isidore Moos  (also Dr. [name] and Dr. Lindi Adie)   Encounter Date: 08/22/2019  PT End of Session - 08/22/19 0913    Visit Number  3    Number of Visits  9    Date for PT Re-Evaluation  09/14/19    PT Start Time  0805    PT Stop Time  0900    PT Time Calculation (min)  55 min    Behavior During Therapy  [facility] for tasks assessed/performed       Past Medical History:  Diagnosis Date  . Anxiety   . Breast cancer ([place]) 2020  . Fibromyalgia   . 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 Migraines   . Mold exposure    toxicity    Past Surgical History:  Procedure Laterality Date  . BREAST LUMPECTOMY WITH RADIOACTIVE SEED AND SENTINEL LYMPH NODE BIOPSY Right 05/24/2019   Procedure: RIGHT BREAST LUMPECTOMY WITH RADIOACTIVE SEED AND RIGHT AXILLARY DEEP SENTINEL LYMPH NODE BIOPSY, INJECT BLUE DYE;  Surgeon: Fanny Skates, MD;  Location: [facility];  Service: General;  Laterality: Right;  . CERVICAL SPINE SURGERY     c5-c6-c7 fusion with disc transplants    There were no vitals filed for this visit.  Subjective Assessment - 08/22/19 0808    Subjective  I got the compression bra [facility]) Friday and wore it for 2 hours and felt good. Then wore it for about 4 hours Saturday and started having fullness in the axilla so took off the bra and then a few hours later started having pain in the Rt axilla and tingling in fingers.    Pertinent History  right breast cancer diagnosed on 04/25/2019 with right lumpectomy on 05/24/2019 with 0/4 nodes removed followed by radiation from 9/18- 07/26/2019 she will have taxoifen but no other chemo    Patient Stated Goals  to get rid  of the pain and swelling in her armpit    Currently in Pain?  Yes    Pain Score  4     Pain Location  Axilla    Pain Orientation  Right    Pain Descriptors / Indicators  Tingling;Numbness    Pain Type  Surgical pain    Pain Onset  More than a month ago    Pain Frequency  Intermittent    Aggravating Factors   apparently too much compression    Pain Relieving Factors  not wearing anything tight; MLD helped last time                       [facility] Adult PT Treatment/Exercise - 08/22/19 0001      Manual Therapy   Manual Therapy  Myofascial release;Edema management;Manual Lymphatic Drainage (MLD)    Soft tissue mobilization  along R breast just inferior to nipple to area of fibrosis and scar tissue with softening noted    Myofascial Release  to R axilla in area of tightness along tightness, but no band noted today.     Manual Lymphatic Drainage (MLD)  In Supine: Short neck, 5 diaphragmatic breaths, Rt inguinal and Lt axillary nodes, Rt axillo-inguinal and anterior inter-axillary anastomosis, then focused on Rt breast and upper arm/axilla redirecting towards pathways  and beginning to instruct pt throughout.                   PT Long Term Goals - 08/15/19 1629      PT LONG TERM GOAL #1   Title  Pt wil report the fullness in her left axilla is dereased by 75%    Time  4    Period  Weeks    Status  New      PT LONG TERM GOAL #2   Title  Pt will be independent in self manual lymph drainage and use of compression to manage fullness in left axilla and breast 4    Period  Weeks    Status  New      PT LONG TERM GOAL #3   Title  Pt will be independent in a gradually progressive exercise program    Time  4    Period  Weeks    Status  New      PT LONG TERM GOAL #4   Title  Pt will decrease Quick DASH score to < 7 indicating an improvment in function of left arm    Baseline  15.91    Time  4    Period  Weeks    Status  New            Plan - 08/22/19 0914     Clinical Impression Statement  Pt returns today reporting having gotten a compression bra since last visit. She was able to wear first day for 2 hours with no increased sympotms. Then second day had to take off after 4 hours due to beginning to note fullness in axilla, then later that day axilla began to hurt and noticed tingling in fingers. So suggested for pt to try wearing compression bra for a few days to a week for only 2 hours at a time and then next session she will learn self MLD and be able to do that as well. Also spoke with pt about seeing if insurance would cover a compression shirt with [facility] and to be measured on 11/23 with fitter, Melissa. Pt was agreeable to this so will fax demographics to [facility] today. She reports feeling good after session today with some relief of symptoms.    Personal Factors and Comorbidities  Comorbidity 2    Comorbidities  surgery, radiation    Examination-Activity Limitations  Other   wearing clothes as they can be painful   Stability/Clinical Decision Making  Stable/Uncomplicated    Rehab Potential  Excellent    PT Frequency  2x / week    PT Duration  4 weeks    PT Treatment/Interventions  ADLs/Self Care Home Management;Therapeutic activities;Therapeutic exercise;Neuromuscular re-education;Patient/family education;Orthotic Fit/Training;Manual techniques;Manual lymph drainage;Compression bandaging;Scar mobilization;Passive range of motion;Taping    PT Next Visit Plan  See how pt felt after this session later in day; able to wear bra for ~2 hrs/day? Instruct pt having her return demo and cont (self) manual lymph drianage to Rt breast and include upper arm; cont manual therapy to decrease scar tissue inferior to breast and tightness at axilla/improve end shoulder ROM. ABC class??    Recommended Other Services  Demographics sent to [facility] (compression shirt? sleeve/gauntlet?)    Consulted and Agree with Plan of Care  Patient       Patient will benefit from  skilled therapeutic intervention in order to improve the following deficits and impairments:  Increased edema, Impaired perceived functional ability, Decreased scar mobility, Decreased knowledge  of precautions, Decreased knowledge of use of DME, Increased fascial restricitons, Impaired UE functional use, Postural dysfunction, Pain  Visit Diagnosis: Aftercare following surgery for neoplasm  Lymphedema, not elsewhere classified     Problem List Patient Active Problem List   Diagnosis Date Noted  . Genetic testing 05/10/2019  . Malignant neoplasm of lower-outer quadrant of right breast of female, estrogen receptor positive ([place]) 04/25/2019  . Chronic migraine w/o aura w/o status migrainosus, not intractable 05/26/2016  . Cervical dystonia 05/26/2016    [name], PTA 08/22/2019, 9:26 AM  [place], [place], [address] Phone: [phone]   Fax:  [phone]  Name: Holly Jenkins MRN: [id number] Date of Birth: [birth year]

## 2019-08-22 NOTE — Progress Notes (Addendum)
Consent Form Botulism Toxin Injection For Chronic Migraine  She continues to note benefit with Botox. About 6-8 migraines per month. Baseline > 20 per month. She is cash pay but anticipates new insurance in a couple of weeks.   Reviewed orally with patient, additionally signature is on file:  Botulism toxin has been approved by the Federal drug administration for treatment of chronic migraine. Botulism toxin does not cure chronic migraine and it may not be effective in some patients.  The administration of botulism toxin is accomplished by injecting a small amount of toxin into the muscles of the neck and head. Dosage must be titrated for each individual. Any benefits resulting from botulism toxin tend to wear off after 3 months with a repeat injection required if benefit is to be maintained. Injections are usually done every 3-4 months with maximum effect peak achieved by about 2 or 3 weeks. Botulism toxin is expensive and you should be sure of what costs you will incur resulting from the injection.  The side effects of botulism toxin use for chronic migraine may include:   -Transient, and usually mild, facial weakness with facial injections  -Transient, and usually mild, head or neck weakness with head/neck injections  -Reduction or loss of forehead facial animation due to forehead muscle weakness  -Eyelid drooping  -Dry eye  -Pain at the site of injection or bruising at the site of injection  -Double vision  -Potential unknown long term risks   Contraindications: You should not have Botox if you are pregnant, nursing, allergic to albumin, have an infection, skin condition, or muscle weakness at the site of the injection, or have myasthenia gravis, Lambert-Eaton syndrome, or ALS.  It is also possible that as with any injection, there may be an allergic reaction or no effect from the medication. Reduced effectiveness after repeated injections is sometimes seen and rarely infection at  the injection site may occur. All care will be taken to prevent these side effects. If therapy is given over a long time, atrophy and wasting in the muscle injected may occur. Occasionally the patient's become refractory to treatment because they develop antibodies to the toxin. In this event, therapy needs to be modified.  I have read the above information and consent to the administration of botulism toxin.    BOTOX PROCEDURE NOTE FOR MIGRAINE HEADACHE  Contraindications and precautions discussed with patient(above). Aseptic procedure was observed and patient tolerated procedure. Procedure performed by Debbora Presto, FNP-C.  The condition has existed for more than 6 months, and pt does not have a diagnosis of ALS, Myasthenia Gravis or Lambert-Eaton Syndrome.  Risks and benefits of injections discussed and pt agrees to proceed with the procedure.  Written consent obtained  These injections are medically necessary. Pt  receives good benefits from these injections. These injections do not cause sedations or hallucinations which the oral therapies may cause.   Description of procedure:  The patient was placed in a sitting position. The standard protocol was used for Botox as follows, with 5 units of Botox injected at each site:  -Procerus muscle, midline injection  -Corrugator muscle, bilateral injection  -Frontalis muscle, bilateral injection, with 2 sites each side, medial injection was performed in the upper one third of the frontalis muscle, in the region vertical from the medial inferior edge of the superior orbital rim. The lateral injection was again in the upper one third of the forehead vertically above the lateral limbus of the cornea, 1.5 cm lateral to the  medial injection site.  -Temporalis muscle injection, 4 sites, bilaterally. The first injection was 3 cm above the tragus of the ear, second injection site was 1.5 cm to 3 cm up from the first injection site in line with the tragus of  the ear. The third injection site was 1.5-3 cm forward between the first 2 injection sites. The fourth injection site was 1.5 cm posterior to the second injection site. 5th site laterally in the temporalis  muscleat the level of the outer canthus.  -Occipitalis muscle injection, 3 sites, bilaterally. The first injection was done one half way between the occipital protuberance and the tip of the mastoid process behind the ear. The second injection site was done lateral and superior to the first, 1 fingerbreadth from the first injection. The third injection site was 1 fingerbreadth superiorly and medially from the first injection site.  -Cervical paraspinal muscle injection, 2 sites, bilateral knee first injection site was 1 cm from the midline of the cervical spine, 3 cm inferior to the lower border of the occipital protuberance. The second injection site was 1.5 cm superiorly and laterally to the first injection site.  -Trapezius muscle injection was performed at 3 sites, bilaterally. The first injection site was in the upper trapezius muscle halfway between the inflection point of the neck, and the acromion. The second injection site was one half way between the acromion and the first injection site. The third injection was done between the first injection site and the inflection point of the neck.  -LS bilaterally   Will return for repeat injection in 3 months.   A 165 units of Botox was used, any Botox not injected was wasted. The patient tolerated the procedure well, there were no complications of the above procedure.  Agree with procedure. Dr Jaynee Eagles

## 2019-08-23 ENCOUNTER — Encounter: Payer: Self-pay | Admitting: Family Medicine

## 2019-08-23 ENCOUNTER — Ambulatory Visit (INDEPENDENT_AMBULATORY_CARE_PROVIDER_SITE_OTHER): Payer: BC Managed Care – PPO | Admitting: Family Medicine

## 2019-08-23 DIAGNOSIS — G43709 Chronic migraine without aura, not intractable, without status migrainosus: Secondary | ICD-10-CM | POA: Diagnosis not present

## 2019-08-23 NOTE — Progress Notes (Signed)
Botox- 100 units x 2 vials Lot: [id number] Expiration: 05/2022 NDC: [id number]  Bacteriostatic 0.9% Sodium Chloride- 61mL total Lot: [id number] Expiration: 04/11/2020 NDC: [id number]  Dx: [id number] B/B

## 2019-08-24 ENCOUNTER — Encounter: Payer: BC Managed Care – PPO | Admitting: Rehabilitation

## 2019-08-24 ENCOUNTER — Other Ambulatory Visit: Payer: Self-pay

## 2019-08-24 ENCOUNTER — Ambulatory Visit: Payer: BC Managed Care – PPO

## 2019-08-24 DIAGNOSIS — Z483 Aftercare following surgery for neoplasm: Secondary | ICD-10-CM

## 2019-08-24 DIAGNOSIS — I890 Lymphedema, not elsewhere classified: Secondary | ICD-10-CM

## 2019-08-24 NOTE — Patient Instructions (Signed)
Self manual lymph drainage: Perform this sequence once a day.  Only give enough pressure to make your skin move.  Hug yourself.  Do circles at your neck just above your collarbones.  Repeat this 10 times.  Diaphragmatic - Supine   Inhale through nose making navel move out toward hands. Exhale through puckered lips, hands follow navel in. Repeat _5__ times. Rest _10__ seconds between repeats.    Axilla - One at a Time   Using full weight of flat hand and fingers at center of uninvolved armpit, make _10__ in-place circles.   Copyright  VHI. All rights reserved.  LEG: Inguinal Nodes Stimulation   With small finger side of hand against hip crease on involved side, gently perform circles at the crease. Repeat __10_ times.   Copyright  VHI. All rights reserved.  1) Axilla to Inguinal Nodes - Sweep   On involved side, sweep _4__ times from armpit along side of trunk to hip crease.  Now gently stretch skin from the involved side to the uninvolved side across the chest at the shoulder line.  Repeat that 4 times.  Draw an imaginary diagonal line from upper outer breast through the nipple area toward lower inner breast.  Direct fluid upward and inward from this line toward the pathway across your upper chest .  Do this in three rows to treat all of the upper inner breast tissue, and do each row 3-4x.      Direct fluid to treat all of lower outer breast tissue downward and outward toward pathway that is aimed at the left groin.  Finish by doing the pathways as described above going from your involved armpit to the same side groin and going across your upper chest from the involved shoulder to the uninvolved shoulder.  Repeat the steps above where you do circles in your right groin and left armpit.

## 2019-08-24 NOTE — Therapy (Signed)
[place], [place], [address] Phone: [phone]   Fax:  [phone]  Physical Therapy Treatment  Patient Details  Name: Holly Jenkins MRN: [id number] Date of Birth: [birth year] Referring Provider (PT): Dr. Isidore Jenkins  (also Dr. Dalbert Jenkins and Dr. Lindi Jenkins)   Encounter Date: 08/24/2019  PT End of Session - 08/24/19 1002    Visit Number  4    Number of Visits  9    Date for PT Re-Evaluation  09/14/19    PT Start Time  0912   pt arrived late   PT Stop Time  1001    PT Time Calculation (min)  49 min    Activity Tolerance  Patient tolerated treatment well    Behavior During Therapy  [facility] for tasks assessed/performed       Past Medical History:  Diagnosis Date  . Anxiety   . Breast cancer ([place]) 2020  . Fibromyalgia   . 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 Migraines   . Mold exposure    toxicity    Past Surgical History:  Procedure Laterality Date  . BREAST LUMPECTOMY WITH RADIOACTIVE SEED AND SENTINEL LYMPH NODE BIOPSY Right 05/24/2019   Procedure: RIGHT BREAST LUMPECTOMY WITH RADIOACTIVE SEED AND RIGHT AXILLARY DEEP SENTINEL LYMPH NODE BIOPSY, INJECT BLUE DYE;  Surgeon: Fanny Skates, MD;  Location: [place];  Service: General;  Laterality: Right;  . CERVICAL SPINE SURGERY     c5-c6-c7 fusion with disc transplants    There were no vitals filed for this visit.  Subjective Assessment - 08/24/19 0913    Subjective  Overall my Rt breast has been feeling alot better. The fullness in my axilla and upper breast is improved and I'm starting to notice more feeling in my axilla. Did not trying wearng the compression bra again yet.    Pertinent History  right breast cancer diagnosed on 04/25/2019 with right lumpectomy on 05/24/2019 with 0/4 nodes removed followed by radiation from 9/18- 07/26/2019 she will have taxoifen but no other chemo    Patient Stated  Goals  to get rid of the pain and swelling in her armpit    Currently in Pain?  No/denies                       [facility] Adult PT Treatment/Exercise - 08/24/19 0001      Manual Therapy   Soft tissue mobilization  along R breast just inferior to nipple to area of fibrosis and scar tissue with softening noted    Myofascial Release  to R axilla in area of tightness along tightness, but no band noted today.     Manual Lymphatic Drainage (MLD)  In Supine: Short neck, 5 diaphragmatic breaths, Rt inguinal and Lt axillary nodes, Rt axillo-inguinal and anterior inter-axillary anastomosis, then focused on Rt breast and upper arm/axilla redirecting towards pathways and beginning to instruct pt throughout.                   PT Long Term Goals - 08/15/19 1629      PT LONG TERM GOAL #1   Title  Pt wil report the fullness in her left axilla is dereased by 75%    Time  4    Period  Weeks    Status  New      PT LONG TERM GOAL #2   Title  Pt will be independent in self  manual lymph drainage and use of compression to manage fullness in left axilla and breast 4    Period  Weeks    Status  New      PT LONG TERM GOAL #3   Title  Pt will be independent in a gradually progressive exercise program    Time  4    Period  Weeks    Status  New      PT LONG TERM GOAL #4   Title  Pt will decrease Quick DASH score to < 7 indicating an improvment in function of left arm    Baseline  15.91    Time  4    Period  Weeks    Status  New            Plan - 08/24/19 1004    Clinical Impression Statement  Continued with manual therapy today and instructed pt in this as well. Had her return demo of each step using hand over hand pressure to teach correct pressure and skin stretch. Pt was able to return good demo by end of session and verbalize good understanding of sequence. She plans to try to start wearing her compression bra daily for at least 2 hrs at a time and follow with MLD. Pt  will be out of town next week.    Personal Factors and Comorbidities  Comorbidity 2    Comorbidities  surgery, radiation    Examination-Activity Limitations  Other   wearing clothes as they can be painful   Stability/Clinical Decision Making  Stable/Uncomplicated    Rehab Potential  Excellent    PT Frequency  2x / week    PT Duration  4 weeks    PT Treatment/Interventions  ADLs/Self Care Home Management;Therapeutic activities;Therapeutic exercise;Neuromuscular re-education;Patient/family education;Orthotic Fit/Training;Manual techniques;Manual lymph drainage;Compression bandaging;Scar mobilization;Passive range of motion;Taping    PT Next Visit Plan  Assess and cont Rt breast MLD; able to start tolerating brief wear of compression bra yet? Cont manual therapy to decrease scar tissue inferior to breast and tightness at axilla    Consulted and Agree with Plan of Care  Patient       Patient will benefit from skilled therapeutic intervention in order to improve the following deficits and impairments:  Increased edema, Impaired perceived functional ability, Decreased scar mobility, Decreased knowledge of precautions, Decreased knowledge of use of DME, Increased fascial restricitons, Impaired UE functional use, Postural dysfunction, Pain  Visit Diagnosis: Aftercare following surgery for neoplasm  Lymphedema, not elsewhere classified     Problem List Patient Active Problem List   Diagnosis Date Noted  . Genetic testing 05/10/2019  . Malignant neoplasm of lower-outer quadrant of right breast of female, estrogen receptor positive ([place]) 04/25/2019  . Chronic migraine w/o aura w/o status migrainosus, not intractable 05/26/2016  . Cervical dystonia 05/26/2016    [name], PTA 08/24/2019, 10:07 AM  [place], [place], [address] Phone: [phone]   Fax:  [phone]  Name: Holly Jenkins MRN: [id number] Date of Birth: [birth year]

## 2019-08-28 ENCOUNTER — Telehealth: Payer: Self-pay | Admitting: *Deleted

## 2019-08-28 NOTE — Telephone Encounter (Signed)
Received call from pt stating since starting the Tamoxifen, she has been experiencing increased hyperactivity, increased anxiety unrelieved with klonopin, and is unable to sleep at night.  Pt believes these symptoms are related to the tamoxifen.  RN educated pt to stop taking tamoxifen x2 weeks to see if symptoms resolve.  RN also educated pt to call the office if symptoms become worse before the 2 weeks.  Pt verbalized understanding.

## 2019-08-30 ENCOUNTER — Ambulatory Visit: Payer: Self-pay | Admitting: Radiation Oncology

## 2019-09-04 ENCOUNTER — Ambulatory Visit: Payer: BC Managed Care – PPO | Admitting: Physical Therapy

## 2019-09-04 ENCOUNTER — Other Ambulatory Visit: Payer: Self-pay

## 2019-09-04 ENCOUNTER — Encounter: Payer: Self-pay | Admitting: Physical Therapy

## 2019-09-04 ENCOUNTER — Ambulatory Visit: Payer: BC Managed Care – PPO

## 2019-09-04 DIAGNOSIS — Z483 Aftercare following surgery for neoplasm: Secondary | ICD-10-CM | POA: Diagnosis not present

## 2019-09-04 DIAGNOSIS — I890 Lymphedema, not elsewhere classified: Secondary | ICD-10-CM

## 2019-09-04 NOTE — Therapy (Signed)
[place], [place], [address] Phone: [phone]   Fax:  [phone]  Physical Therapy Treatment  Patient Details  Name: Holly Jenkins MRN: [id number] Date of Birth: 10-08-1976 Referring Provider (PT): Dr. Isidore Moos  (also Dr. [name] and Dr. Lindi Adie)   Encounter Date: 09/04/2019  PT End of Session - 09/04/19 1455    Visit Number  5    Number of Visits  9    Date for PT Re-Evaluation  09/14/19    PT Start Time  [id number]    PT Stop Time  1450    PT Time Calculation (min)  45 min    Activity Tolerance  Patient tolerated treatment well    Behavior During Therapy  [facility] for tasks assessed/performed       Past Medical History:  Diagnosis Date  . Anxiety   . Breast cancer ([place]) 2020  . Fibromyalgia   . 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 Migraines   . Mold exposure    toxicity    Past Surgical History:  Procedure Laterality Date  . BREAST LUMPECTOMY WITH RADIOACTIVE SEED AND SENTINEL LYMPH NODE BIOPSY Right 05/24/2019   Procedure: RIGHT BREAST LUMPECTOMY WITH RADIOACTIVE SEED AND RIGHT AXILLARY DEEP SENTINEL LYMPH NODE BIOPSY, INJECT BLUE DYE;  Surgeon: Fanny Skates, MD;  Location: [place];  Service: General;  Laterality: Right;  . CERVICAL SPINE SURGERY     c5-c6-c7 fusion with disc transplants    There were no vitals filed for this visit.  Subjective Assessment - 09/04/19 1408    Subjective  I stopped taking the Tamoxifen because I could not sleep at night. I think the swelling is a little better. I can tolerate the bra but I don't think it makes me feel better.    Pertinent History  right breast cancer diagnosed on 04/25/2019 with right lumpectomy on 05/24/2019 with 0/4 nodes removed followed by radiation from 9/18- 07/26/2019 she will have taxoifen but no other chemo    Patient Stated Goals  to get rid of the pain and swelling in her armpit                        [facility] Adult PT Treatment/Exercise - 09/04/19 0001      Manual Therapy   Soft tissue mobilization  along R breast just inferior to nipple to area of fibrosis and scar tissue with softening noted    Manual Lymphatic Drainage (MLD)  In Supine: Short neck, superficial and deep abdominals, Rt inguinal and Lt axillary nodes, Rt axillo-inguinal and anterior inter-axillary anastomosis, then focused on Rt breast and upper arm/axilla redirecting towards pathways                  PT Long Term Goals - 08/15/19 1629      PT LONG TERM GOAL #1   Title  Pt wil report the fullness in her left axilla is dereased by 75%    Time  4    Period  Weeks    Status  New      PT LONG TERM GOAL #2   Title  Pt will be independent in self manual lymph drainage and use of compression to manage fullness in left axilla and breast 4    Period  Weeks    Status  New      PT LONG TERM GOAL #3   Title  Pt  will be independent in a gradually progressive exercise program    Time  4    Period  Weeks    Status  New      PT LONG TERM GOAL #4   Title  Pt will decrease Quick DASH score to < 7 indicating an improvment in function of left arm    Baseline  15.91    Time  4    Period  Weeks    Status  New            Plan - 09/04/19 1456    Clinical Impression Statement  Conitnued with MLD to R breast and UE where pt has increased discomfort in these areas. Also performed soft tissue mobilization along area of scar tissue near nipple. Pt is stil not feeling relief from wearing bra because it makes the axilla more tender and swollen when she does. Pt was measured for a compression shirt and sleeve today and should receive it in a few weeks. Will decrease to 1x/wk beginning next week.    PT Frequency  2x / week   decrease to 1x/wk at next visit   PT Duration  4 weeks    PT Treatment/Interventions  ADLs/Self Care Home Management;Therapeutic activities;Therapeutic  exercise;Neuromuscular re-education;Patient/family education;Orthotic Fit/Training;Manual techniques;Manual lymph drainage;Compression bandaging;Scar mobilization;Passive range of motion;Taping    PT Next Visit Plan  Ensure pt can do self MLD, decrease to 1x/wk for another 4 weeks, Assess and cont Rt breast MLD; able to start tolerating brief wear of compression bra yet? Cont manual therapy to decrease scar tissue inferior to breast and tightness at axilla    Consulted and Agree with Plan of Care  Patient       Patient will benefit from skilled therapeutic intervention in order to improve the following deficits and impairments:  Increased edema, Impaired perceived functional ability, Decreased scar mobility, Decreased knowledge of precautions, Decreased knowledge of use of DME, Increased fascial restricitons, Impaired UE functional use, Postural dysfunction, Pain  Visit Diagnosis: Lymphedema, not elsewhere classified  Aftercare following surgery for neoplasm     Problem List Patient Active Problem List   Diagnosis Date Noted  . Genetic testing 05/10/2019  . Malignant neoplasm of lower-outer quadrant of right breast of female, estrogen receptor positive ([place]) 04/25/2019  . Chronic migraine w/o aura w/o status migrainosus, not intractable 05/26/2016  . Cervical dystonia 05/26/2016    Allyson Sabal [facility] 09/04/2019, 2:59 PM  [place], [place], [address] Phone: [phone]   Fax:  [phone]  Name: Holly Jenkins MRN: [id number] Date of Birth: 03-18-1976  Manus Gunning, PT 09/04/19 2:59 PM

## 2019-09-06 ENCOUNTER — Ambulatory Visit: Payer: BC Managed Care – PPO | Admitting: Rehabilitation

## 2019-09-06 ENCOUNTER — Other Ambulatory Visit: Payer: Self-pay

## 2019-09-06 ENCOUNTER — Encounter: Payer: Self-pay | Admitting: Rehabilitation

## 2019-09-06 DIAGNOSIS — Z483 Aftercare following surgery for neoplasm: Secondary | ICD-10-CM | POA: Diagnosis not present

## 2019-09-06 DIAGNOSIS — I890 Lymphedema, not elsewhere classified: Secondary | ICD-10-CM

## 2019-09-06 NOTE — Patient Instructions (Signed)
Access Code: [email or web address]  URL: [email or web address]  Date: 09/06/2019  Prepared by: Shan Levans   Exercises  Neta Mends with Blocks - 1-3 reps - 1-3 sets - 30-60 second hold - 1x daily - 7x weekly  Neta Mends with Blocks Underneath Hands, Knees Apart, and Hands Forward - 1-3 reps - 1-3 sets - 30-60 second hold - 1x daily - 7x weekly  Reverse Warrior Pose - 10 reps - 1-3 sets - 20-30 seconds hold - 1x daily - 7x weekly

## 2019-09-06 NOTE — Therapy (Signed)
[place], [place], [address] Phone: [phone]   Fax:  [phone]  Physical Therapy Treatment  Patient Details  Name: Holly Jenkins MRN: [id number] Date of Birth: April 05, 1976 Referring Provider (PT): Dr. Isidore Moos  (also Dr. [name] and Dr. Lindi Adie)   Encounter Date: 09/06/2019  PT End of Session - 09/06/19 0954    Visit Number  6    Number of Visits  9    Date for PT Re-Evaluation  09/14/19    PT Start Time  0802    PT Stop Time  0855    PT Time Calculation (min)  53 min    Activity Tolerance  Patient tolerated treatment well    Behavior During Therapy  [facility] for tasks assessed/performed       Past Medical History:  Diagnosis Date  . Anxiety   . Breast cancer ([place]) 2020  . Fibromyalgia   . 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 Migraines   . Mold exposure    toxicity    Past Surgical History:  Procedure Laterality Date  . BREAST LUMPECTOMY WITH RADIOACTIVE SEED AND SENTINEL LYMPH NODE BIOPSY Right 05/24/2019   Procedure: RIGHT BREAST LUMPECTOMY WITH RADIOACTIVE SEED AND RIGHT AXILLARY DEEP SENTINEL LYMPH NODE BIOPSY, INJECT BLUE DYE;  Surgeon: Fanny Skates, MD;  Location: [facility];  Service: General;  Laterality: Right;  . CERVICAL SPINE SURGERY     c5-c6-c7 fusion with disc transplants    There were no vitals filed for this visit.  Subjective Assessment - 09/06/19 0806    Subjective  I wore the bra and it still makes me swell more.    Pertinent History  right breast cancer diagnosed on 04/25/2019 with right lumpectomy on 05/24/2019 with 0/4 nodes removed followed by radiation from 9/18- 07/26/2019 she will have taxoifen but no other chemo    Patient Stated Goals  to get rid of the pain and swelling in her armpit    Currently in Pain?  No/denies                       [facility] Adult PT Treatment/Exercise - 09/06/19 0001       Exercises   Exercises  Other Exercises    Other Exercises   gave pt handout on childspose elevated on block with and without blocks and reverse warrior as good yoga poses for latissimus stretching      Manual Therapy   Soft tissue mobilization  along latissimus wing, pectoralis and in sidleying to the axillary region and pectoralis with tightness here    Manual Lymphatic Drainage (MLD)  In Supine: Short neck, superficial and deep abdominals, Rt inguinal and bil axillary nodes, Rt axillo-inguinal and anterior inter-axillary anastomosis, then focused on Rt breast and upper arm/axilla redirecting towards pathways, then in sidelying lateral chest wall and breast posterior interaxillary work                  PT Long Term Goals - 08/15/19 1629      PT LONG TERM GOAL #1   Title  Pt wil report the fullness in her left axilla is dereased by 75%    Time  4    Period  Weeks    Status  New      PT LONG TERM GOAL #2   Title  Pt will be independent in self manual lymph drainage and use of compression  to manage fullness in left axilla and breast 4    Period  Weeks    Status  New      PT LONG TERM GOAL #3   Title  Pt will be independent in a gradually progressive exercise program    Time  4    Period  Weeks    Status  New      PT LONG TERM GOAL #4   Title  Pt will decrease Quick DASH score to < 7 indicating an improvment in function of left arm    Baseline  15.91    Time  4    Period  Weeks    Status  New            Plan - 09/06/19 0955    Clinical Impression Statement  continued with Rt breast MLD inferior and lateral focus and Rt axilla focus.  Pectoralis tightness evident mostly in sidelying and around scar tissue near the SLNB site.  Gave pt some yoga to work on with her normal yoga vidoes for latissimus mobility.    PT Frequency  1x / week    PT Duration  4 weeks    PT Treatment/Interventions  ADLs/Self Care Home Management;Therapeutic activities;Therapeutic  exercise;Neuromuscular re-education;Patient/family education;Orthotic Fit/Training;Manual techniques;Manual lymph drainage;Compression bandaging;Scar mobilization;Passive range of motion;Taping    PT Next Visit Plan  Assess and cont Rt breast MLD; Cont manual therapy to decrease scar tissue inferior to breast and tightness at axilla in sidlying at pectoralis    Consulted and Agree with Plan of Care  Patient       Patient will benefit from skilled therapeutic intervention in order to improve the following deficits and impairments:     Visit Diagnosis: Lymphedema, not elsewhere classified  Aftercare following surgery for neoplasm     Problem List Patient Active Problem List   Diagnosis Date Noted  . Genetic testing 05/10/2019  . Malignant neoplasm of lower-outer quadrant of right breast of female, estrogen receptor positive ([place]) 04/25/2019  . Chronic migraine w/o aura w/o status migrainosus, not intractable 05/26/2016  . Cervical dystonia 05/26/2016    Stark Bray 09/06/2019, 9:57 AM  [place], [place], [address] Phone: [phone]   Fax:  [phone]  Name: Holly Jenkins MRN: [id number] Date of Birth: [birth year]

## 2019-09-11 ENCOUNTER — Other Ambulatory Visit: Payer: Self-pay

## 2019-09-11 ENCOUNTER — Ambulatory Visit: Payer: BC Managed Care – PPO

## 2019-09-11 DIAGNOSIS — I890 Lymphedema, not elsewhere classified: Secondary | ICD-10-CM

## 2019-09-11 DIAGNOSIS — Z483 Aftercare following surgery for neoplasm: Secondary | ICD-10-CM | POA: Diagnosis not present

## 2019-09-11 NOTE — Therapy (Signed)
[place], [place], [address] Phone: [phone]   Fax:  [phone]  Physical Therapy Treatment  Patient Details  Name: Holly Jenkins MRN: [id number] Date of Birth: 26-Nov-1975 Referring Provider (PT): Dr. Isidore Moos  (also Dr. [name] and Dr. Lindi Adie)   Encounter Date: 09/11/2019  PT End of Session - 09/11/19 1417    Visit Number  7    Number of Visits  9    Date for PT Re-Evaluation  09/14/19    PT Start Time  2620    PT Stop Time  1402    PT Time Calculation (min)  59 min    Activity Tolerance  Patient tolerated treatment well    Behavior During Therapy  [facility] for tasks assessed/performed       Past Medical History:  Diagnosis Date  . Anxiety   . Breast cancer ([place]) 2020  . Fibromyalgia   . 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 Migraines   . Mold exposure    toxicity    Past Surgical History:  Procedure Laterality Date  . BREAST LUMPECTOMY WITH RADIOACTIVE SEED AND SENTINEL LYMPH NODE BIOPSY Right 05/24/2019   Procedure: RIGHT BREAST LUMPECTOMY WITH RADIOACTIVE SEED AND RIGHT AXILLARY DEEP SENTINEL LYMPH NODE BIOPSY, INJECT BLUE DYE;  Surgeon: Fanny Skates, MD;  Location: [place];  Service: General;  Laterality: Right;  . CERVICAL SPINE SURGERY     c5-c6-c7 fusion with disc transplants    There were no vitals filed for this visit.  Subjective Assessment - 09/11/19 1308    Subjective  I got the compression shirt Friday and I absolutely love it! It fits great and has helped the swelling under my arm. I haven't worn it with the sleeve yet, but Melissa (fitter) said it will be even more beneficial when wearing with the compression sleeve.    Pertinent History  right breast cancer diagnosed on 04/25/2019 with right lumpectomy on 05/24/2019 with 0/4 nodes removed followed by radiation from 9/18- 07/26/2019 she will have taxoifen but no other  chemo    Patient Stated Goals  to get rid of the pain and swelling in her armpit    Currently in Pain?  No/denies                       [facility] Adult PT Treatment/Exercise - 09/11/19 0001      Manual Therapy   Manual Therapy  Soft tissue mobilization;Myofascial release;Manual Lymphatic Drainage (MLD);Passive ROM    Soft tissue mobilization  along latissimus wing, pectoralis and in sidleying to the axillary region and pectoralis with tightness here    Myofascial Release  To Rt axilla during P/ROM to tight band which softened well by end of stretching    Manual Lymphatic Drainage (MLD)  In Supine: Short neck, superficial and deep abdominals, Rt inguinal and bil axillary nodes, Rt axillo-inguinal and anterior inter-axillary anastomosis, then focused on Rt breast and upper arm/axilla redirecting towards pathways, then in sidelying lateral chest wall and breast posterior interaxillary work    Passive ROM  In Supine to Rt shoulder into flexion, abduction and D2 to pts end ROM                  PT Long Term Goals - 09/11/19 1400      PT LONG TERM GOAL #1   Title  Pt wil report the fullness in her right axilla  is dereased by 75%    Baseline  50% improvement at this time-09/11/19    Status  On-going      PT LONG TERM GOAL #2   Title  Pt will be independent in self manual lymph drainage and use of compression to manage fullness in right axilla and breast      PT LONG TERM GOAL #3   Title  Pt will be independent in a gradually progressive exercise program    Baseline  Pt reports independence with her own strenthening exercise program, has been instructed in Rt lateral trunk stretches and today new axillary stretches-09/11/19    Status  Partially Met      PT LONG TERM GOAL #4   Title  Pt will decrease Quick DASH score to < 7 indicating an improvment in function of left arm    Status  On-going            Plan - 09/11/19 1419    Clinical Impression Statement  Pt  reports her new compression shirt and sleeve arrived Friday and she was worn the shirt for past 2 days with excellent results. Has not worn with the compression sleeve yet but plans to do that in next few days as fitter, Melissa, said that wearing both would give pt optimal results. Her Rt breast swelling seems much imporved, including at area of incision inferior to breast. She is limited in end P/ROM with axillary tightness that responded well today to P/ROM and myofascial release. Pt reports she feels confident with her own current HEP for general strengthening but would like to learn all she can to improve Rt UE function. Instructed her how to include end ROM stretches into her ADLs including but not limited to stretching arm OH in doorway and reaching to higher cabinet shelving with a forward lean into end motion.    Personal Factors and Comorbidities  Comorbidity 2    Comorbidities  surgery, radiation    Examination-Activity Limitations  Other   wearing clothes as they can be painful   Stability/Clinical Decision Making  Stable/Uncomplicated    Rehab Potential  Excellent    PT Frequency  1x / week    PT Duration  4 weeks    PT Treatment/Interventions  ADLs/Self Care Home Management;Therapeutic activities;Therapeutic exercise;Neuromuscular re-education;Patient/family education;Orthotic Fit/Training;Manual techniques;Manual lymph drainage;Compression bandaging;Scar mobilization;Passive range of motion;Taping    PT Next Visit Plan  Determine renewal or if pt ready for D/C next. Cont Rt end ROM stretching of Rt shoulder including myofascial release to Rt axilla at area of tightness. All new compression still ok?    Consulted and Agree with Plan of Care  Patient       Patient will benefit from skilled therapeutic intervention in order to improve the following deficits and impairments:  Increased edema, Impaired perceived functional ability, Decreased scar mobility, Decreased knowledge of precautions,  Decreased knowledge of use of DME, Increased fascial restricitons, Impaired UE functional use, Postural dysfunction, Pain  Visit Diagnosis: Lymphedema, not elsewhere classified  Aftercare following surgery for neoplasm     Problem List Patient Active Problem List   Diagnosis Date Noted  . Genetic testing 05/10/2019  . Malignant neoplasm of lower-outer quadrant of right breast of female, estrogen receptor positive ([place]) 04/25/2019  . Chronic migraine w/o aura w/o status migrainosus, not intractable 05/26/2016  . Cervical dystonia 05/26/2016    [name], PTA 09/11/2019, 2:29 PM  [place], [place],  [address] Phone: [phone]   Fax:  [phone]  Name: BAYLEN DEA MRN: [id number] Date of Birth: [birth year]

## 2019-09-12 ENCOUNTER — Telehealth: Payer: Self-pay | Admitting: Family Medicine

## 2019-09-12 NOTE — Progress Notes (Signed)
I called the patient today about her upcoming follow-up appointment in radiation oncology.   Given the state of the COVID-19 pandemic, concerning case numbers in our community, and guidance from [facility], I offered a phone assessment with the patient to determine if coming to the clinic was necessary. She accepted.  I let the patient know that I had spoken with Dr. Isidore Moos, and she wanted them to know the importance of washing their hands for at least 20 seconds at a time, especially after going out in public, and before they eat.  Limit going out in public whenever possible. Do not touch your face, unless your hands are clean, such as when bathing. Get plenty of rest, eat well, and stay hydrated.   The patient denies any symptomatic concerns.  Specifically, they report good healing of their skin in the radiation fields.  Skin is intact.    I recommended that she continue skin care by applying oil or lotion with vitamin E to the skin in the radiation fields, BID, for 2 more months.  Continue follow-up with medical oncology - follow-up is scheduled on 11/03/19 with Wilber Bihari from [place].  I explained that yearly mammograms are important for patients with intact breast tissue, and physical exams are important after mastectomy for patients that cannot undergo mammography.  I encouraged her to call if she had further questions or concerns about her healing. Otherwise, she will follow-up PRN in radiation oncology. Patient is pleased with this plan, and we will cancel her upcoming follow-up to reduce the risk of COVID-19 transmission.

## 2019-09-12 NOTE — Telephone Encounter (Signed)
Pt has called to provide her new insurance information for Botox. [employer],  [place] [id number]  [phone] RX [phone]

## 2019-09-13 ENCOUNTER — Inpatient Hospital Stay
Admission: RE | Admit: 2019-09-13 | Discharge: 2019-09-13 | Disposition: A | Discharge status: Home / Self Care | Payer: BC Managed Care – PPO | Source: Ambulatory Visit | Attending: Radiation Oncology | Admitting: Radiation Oncology

## 2019-09-18 ENCOUNTER — Ambulatory Visit: Payer: BC Managed Care – PPO | Attending: Radiation Oncology | Admitting: Physical Therapy

## 2019-09-18 ENCOUNTER — Telehealth: Payer: Self-pay | Admitting: Hematology and Oncology

## 2019-09-18 ENCOUNTER — Encounter: Payer: Self-pay | Admitting: Physical Therapy

## 2019-09-18 ENCOUNTER — Other Ambulatory Visit: Payer: Self-pay

## 2019-09-18 DIAGNOSIS — I890 Lymphedema, not elsewhere classified: Secondary | ICD-10-CM | POA: Insufficient documentation

## 2019-09-18 DIAGNOSIS — Z483 Aftercare following surgery for neoplasm: Secondary | ICD-10-CM

## 2019-09-18 NOTE — Therapy (Signed)
[place], [place], [address] Phone: [phone]   Fax:  [phone]  Physical Therapy Treatment  Patient Details  Name: Holly Jenkins MRN: [id number] Date of Birth: 07-06-1976 Referring Provider (PT): Dr. Isidore Moos  (also Dr. [name] and Dr. Lindi Adie)   Encounter Date: 09/18/2019  PT End of Session - 09/18/19 0854    Visit Number  8    Number of Visits  17    Date for PT Re-Evaluation  10/16/19    PT Start Time  0809    PT Stop Time  0850    PT Time Calculation (min)  41 min    Activity Tolerance  Patient tolerated treatment well    Behavior During Therapy  [facility] for tasks assessed/performed       Past Medical History:  Diagnosis Date  . Anxiety   . Breast cancer ([place]) 2020  . Fibromyalgia   . 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 Migraines   . Mold exposure    toxicity    Past Surgical History:  Procedure Laterality Date  . BREAST LUMPECTOMY WITH RADIOACTIVE SEED AND SENTINEL LYMPH NODE BIOPSY Right 05/24/2019   Procedure: RIGHT BREAST LUMPECTOMY WITH RADIOACTIVE SEED AND RIGHT AXILLARY DEEP SENTINEL LYMPH NODE BIOPSY, INJECT BLUE DYE;  Surgeon: Fanny Skates, MD;  Location: [facility];  Service: General;  Laterality: Right;  . CERVICAL SPINE SURGERY     c5-c6-c7 fusion with disc transplants    There were no vitals filed for this visit.  Subjective Assessment - 09/18/19 0810    Subjective  My breast dropped some all of a sudden which is great. It is still not normal but it is better than it was. There is way less pain in my upper arm but I can still feel it. I love the compression shirt.    Pertinent History  right breast cancer diagnosed on 04/25/2019 with right lumpectomy on 05/24/2019 with 0/4 nodes removed followed by radiation from 9/18- 07/26/2019 she will have taxoifen but no other chemo    Patient Stated Goals  to get rid of the pain and  swelling in her armpit    Currently in Pain?  No/denies    Pain Score  0-No pain               [name] - 09/18/19 0001    Open a tight or new jar  Mild difficulty    Do heavy household chores (wash walls, wash floors)  Mild difficulty    Carry a shopping bag or briefcase  No difficulty    Wash your back  No difficulty    Use a knife to cut food  No difficulty    Recreational activities in which you take some force or impact through your arm, shoulder, or hand (golf, hammering, tennis)  Moderate difficulty    During the past week, to what extent has your arm, shoulder or hand problem interfered with your normal social activities with family, friends, neighbors, or groups?  Slightly    During the past week, to what extent has your arm, shoulder or hand problem limited your work or other regular daily activities  Slightly    Arm, shoulder, or hand pain.  Mild    Tingling (pins and needles) in your arm, shoulder, or hand  None    Difficulty Sleeping  Mild difficulty    DASH Score  18.18 %  [facility] Adult PT Treatment/Exercise - 09/18/19 0001      Manual Therapy   Soft tissue mobilization  along right breast just inferior to nipple in area of increased scar tissue, along right lateral trunk then in to left sidelying to lateral edge of latissimus where increased tightness was palpable and there were numerous knots that did decrease with treatment- pt reports this is the area of her pain and discomfort                  PT Long Term Goals - 09/18/19 0811      PT LONG TERM GOAL #1   Title  Pt wil report the fullness in her right axilla is dereased by 75%    Baseline  50% improvement at this time-09/11/19, 09/18/19- 70% improvement    Time  4    Period  Weeks    Status  On-going      PT LONG TERM GOAL #2   Title  Pt will be independent in self manual lymph drainage and use of compression to manage fullness in right axilla and breast    Baseline   09/18/19- pt is independent in this    Period  Weeks    Status  Achieved      PT LONG TERM GOAL #3   Title  Pt will be independent in a gradually progressive exercise program    Baseline  Pt reports independence with her own strenthening exercise program, has been instructed in Rt lateral trunk stretches and today new axillary stretches-09/11/19    Time  4    Period  Weeks    Status  Achieved      PT LONG TERM GOAL #4   Title  Pt will decrease Quick DASH score to < 7 indicating an improvment in function of left arm    Baseline  15.91, 09/18/19- 18.18    Time  4    Period  Weeks    Status  On-going      PT LONG TERM GOAL #5   Title  Pt will be able to sleep on right without increased pain, discomfort and tightnening.    Time  4    Period  Weeks    Status  New    Target Date  10/16/19            Plan - 09/18/19 0854    Clinical Impression Statement  Assessed pt's progress towards goals in therapy. Pt reports her right breast finally dropped some and is not as tight.  Pt is progressing towards all goals but is still having difficulty sleeping on her right side and wearing a bra due to pain and discomfort along right lateral trunk. Updated goals today and added new goal to address sleep. Focused on soft tissue mobilization to lateral lattisimus where numerous knots are palpable and this is pt's area of increased discomfort. Pt would benefit from additional skilled PT session to continue to progress towards goals and improve comfort to allow pt to wear a bra and sleep on her right side without discomfort.    PT Frequency  2x / week    PT Duration  4 weeks    PT Treatment/Interventions  ADLs/Self Care Home Management;Therapeutic activities;Therapeutic exercise;Neuromuscular re-education;Patient/family education;Orthotic Fit/Training;Manual techniques;Manual lymph drainage;Compression bandaging;Scar mobilization;Passive range of motion;Taping    PT Next Visit Plan  see if pts discomfort  was improved after STM last session, continue STM especially along lateral lastissimus and myofascial/STM to area of scar tissue  just below areola    Consulted and Agree with Plan of Care  Patient       Patient will benefit from skilled therapeutic intervention in order to improve the following deficits and impairments:  Increased edema, Impaired perceived functional ability, Decreased scar mobility, Decreased knowledge of precautions, Decreased knowledge of use of DME, Increased fascial restricitons, Impaired UE functional use, Postural dysfunction, Pain  Visit Diagnosis: Lymphedema, not elsewhere classified  Aftercare following surgery for neoplasm     Problem List Patient Active Problem List   Diagnosis Date Noted  . Genetic testing 05/10/2019  . Malignant neoplasm of lower-outer quadrant of right breast of female, estrogen receptor positive ([place]) 04/25/2019  . Chronic migraine w/o aura w/o status migrainosus, not intractable 05/26/2016  . Cervical dystonia 05/26/2016    Allyson Sabal [facility] 09/18/2019, 8:58 AM  [place], [place], [address] Phone: [phone]   Fax:  [phone]  Name: LINELLE HAYNIE MRN: [id number] Date of Birth: 09/22/1976  Manus Gunning, PT 09/18/19 8:58 AM

## 2019-09-18 NOTE — Telephone Encounter (Signed)
Returned patient's phone call regarding scheduling an appointment, transferred patient to speak with providers nurse.

## 2019-09-20 NOTE — Progress Notes (Signed)
Is Dr. Truman Jenkins 19 I called in a prescription for patient was all goodness so sorry I did the wrong #5 patient  Patient Care Team: Marda Stalker, PA-C as PCP - General (Family Medicine) Mauro Kaufmann, RN as Oncology Nurse Navigator Rockwell Germany, RN as Oncology Nurse Navigator Fanny Skates, MD as Consulting Physician (General Surgery) Nicholas Lose, MD as Consulting Physician (Hematology and Oncology) Eppie Gibson, MD as Attending Physician (Radiation Oncology)  DIAGNOSIS:    ICD-10-CM   1. Malignant neoplasm of lower-outer quadrant of right breast of female, estrogen receptor positive ([place])  C50.511    Z17.0     SUMMARY OF ONCOLOGIC HISTORY: Oncology History  Malignant neoplasm of lower-outer quadrant of right breast of female, estrogen receptor positive ([place])  04/25/2019 Initial Diagnosis   Right breast mass identified on clinical examination. Mammogram showed 1.6cm indeterminate mass in the lower outer right breast, no abnormal axillary lymph nodes. 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   05/03/2019 Cancer Staging   Staging form: Breast, AJCC 8th Edition - Clinical stage from 05/03/2019: Stage IA (cT1c, cN0, cM0, G2, ER+, PR+, HER2-) - Signed by Nicholas Lose, MD on 05/03/2019    Genetic Testing   4 Variants of Uncertain Significance: VUS in BMPR1A called c.565T>C, VUS in NBN called c.724>A, VUS in NTHL1 called c.704G>A, and VUS in POLE c.1847G>A identified on the [facility] Breast Cancer STAT Panel  + Common Hereditary Cancers Panel. The STAT Breast cancer panel offered by [facility] includes sequencing and rearrangement analysis for the following 9 genes:  ATM, BRCA1, BRCA2, CDH1, CHEK2, PALB2, PTEN, STK11 and TP53.   The Common Hereditary Cancers Panel offered by [facility] includes sequencing and/or deletion duplication testing of the following 47 genes: APC, ATM, AXIN2, BARD1, BMPR1A, BRCA1, BRCA2, BRIP1, CDH1, CDKN2A (p14ARF), CDKN2A (p16INK4a),  CKD4, CHEK2, CTNNA1, DICER1, EPCAM (Deletion/duplication testing only), GREM1 (promoter region deletion/duplication testing only), KIT, MEN1, MLH1, MSH2, MSH3, MSH6, MUTYH, NBN, NF1, NHTL1, PALB2, PDGFRA, PMS2, POLD1, POLE, PTEN, RAD50, RAD51C, RAD51D, SDHB, SDHC, SDHD, SMAD4, SMARCA4. STK11, TP53, TSC1, TSC2, and VHL.  The following genes were evaluated for sequence changes only: SDHA and HOXB13 c.251G>A variant only. The report date is 05/09/2019.   05/24/2019 Surgery   Right lumpectomy Holly Jenkins): IDC, grade 1, 1.1cm, with LCIS and high grade DCIS, clear margins, and no evidence of malignancy in 4 lymph nodes.    05/24/2019 Cancer Staging   Staging form: Breast, AJCC 8th Edition - Pathologic stage from 05/24/2019: Stage IA (pT1c, pN0, cM0, G1, ER+, PR+, HER2-) - Signed by Holly Phlegm, NP on 06/07/2019   05/24/2019 Oncotype testing   Oncotype score of 18/5%   06/30/2019 -  Radiation Therapy   Adjuvant radiation   07/28/2019 -  Anti-estrogen oral therapy   Tamoxifen 24m daily, plan for 10 years     CHIEF COMPLIANT: Follow-up of right breast cancer on tamoxifen  INTERVAL HISTORY: Holly GOREYis a 43y.o. with above-mentioned history of right breast cancer treated with lumpectomy, radiation, and who is currently on antiestrogen therapy with tamoxifen, which was discontinued 3 weeks ago. She presents to the clinic today for follow-up of increased hyperactivity, increased anxiety unrelieved with klonopin, and difficulty sleeping, which she attributes to tamoxifen.  She has problems with anxiety, vaginal dryness and inability to control body temperature with both feeling extreme fatigue and extreme cold as well.  REVIEW OF SYSTEMS:   Constitutional: Denies fevers, chills or abnormal weight loss  Eyes: Denies blurriness of vision Ears, nose, mouth, throat, and face: Denies mucositis or sore throat Respiratory: Denies cough, dyspnea or wheezes Cardiovascular: Denies palpitation,  chest discomfort Gastrointestinal: Denies nausea, heartburn or change in bowel habits Skin: Denies abnormal skin rashes Lymphatics: Denies new lymphadenopathy or easy bruising Neurological: Denies numbness, tingling or new weaknesses Behavioral/Psych: Anxiety Extremities: No lower extremity edema Breast: denies any pain or lumps or nodules in either breasts All other systems were reviewed with the patient and are negative.  I have reviewed the past medical history, past surgical history, social history and family history with the patient and they are unchanged from previous note.  ALLERGIES:  is allergic to dust mite extract; eggs or egg-derived products; gluten meal; thimerosal; peanut-containing drug products; and sulfites.  MEDICATIONS:  Current Outpatient Medications  Medication Sig Dispense Refill  . AJOVY 225 MG/1.5ML SOSY INJECT 1 syringe SUBCUTANEOUSLY EVERY 30 DAYS 1.5 mL 4  . Ascorbic Acid (VITA-C PO) Take 1 tablet by mouth at bedtime.     Marland Kitchen BLACK CURRANT SEED OIL PO Take 1 tablet by mouth 2 (two) times daily.    . botulinum toxin Type A (BOTOX) 100 units SOLR injection Inject IM every 3 months into head and neck muscles by provider in the office 2 vial 3  . clonazePAM (KLONOPIN) 0.5 MG tablet Take 0.5 mg by mouth 2 (two) times daily as needed for anxiety.    . Diclofenac Potassium (CAMBIA) 50 MG PACK MIX 1 PACKET WITH 1-2 OUNCES OF WATER. DRINK IMMEDIATELY AS A SINGLE DOSE 9 each 11  . Melatonin-Pyridoxine (MELATIN PO) Take 1 tablet by mouth daily.    . Menaquinone-7 (VITAMIN K2 PO) Take 1 tablet by mouth daily.    . Multiple Vitamins-Minerals (ZINC PO) Take 1 tablet by mouth 2 (two) times daily before a meal.     . NON FORMULARY D-HIST  2 TABS PO QD    . NON FORMULARY Take 1 tablet by mouth. Methylation Complete 1 tab daily in the afternoon under the tongue    . Omega-3 Fatty Acids (OMEGA 3 PO) Take 1 tablet by mouth daily.    . ondansetron (ZOFRAN) 4 MG tablet Take acutely  at onset of migraine or nausea. As needed.Maximum 4x a day 20 tablet 6  . OVER THE COUNTER MEDICATION Take 1 tablet by mouth daily. Vitamin D3    . OVER THE COUNTER MEDICATION Take 1 tablet by mouth. Iron one tablet daily in the afternoon    . propranolol (INDERAL) 10 MG tablet Take acutely for migraine 60 tablet 6  . tamoxifen (NOLVADEX) 20 MG tablet Take 1 tablet (20 mg total) by mouth daily. 90 tablet 3  . Testosterone Propionate (FIRST-TESTOSTERONE MC) 2 % CREA Place 0.25 g onto the skin daily.  0  . Thyroid (NATURE-THROID PO) Take by mouth daily.    . TURMERIC PO Take by mouth daily.    Marland Kitchen VITAMIN B COMPLEX-C PO Take 1 tablet by mouth daily.     No current facility-administered medications for this visit.    PHYSICAL EXAMINATION: ECOG PERFORMANCE STATUS: 1 - Symptomatic but completely ambulatory  Vitals:   09/21/19 0808  BP: 94/80  Pulse: 99  Resp: 18  Temp: 98.7 F (37.1 C)  SpO2: 100%   Filed Weights   09/21/19 0808  Weight: 141 lb 1.6 oz (64 kg)    GENERAL: alert, no distress and comfortable SKIN: skin color, texture, turgor are normal, no rashes or significant lesions EYES: normal, Conjunctiva  are pink and non-injected, sclera clear OROPHARYNX: no exudate, no erythema and lips, buccal mucosa, and tongue normal  NECK: supple, thyroid normal size, non-tender, without nodularity LYMPH: no palpable lymphadenopathy in the cervical, axillary or inguinal LUNGS: clear to auscultation and percussion with normal breathing effort HEART: regular rate & rhythm and no murmurs and no lower extremity edema ABDOMEN: abdomen soft, non-tender and normal bowel sounds MUSCULOSKELETAL: no cyanosis of digits and no clubbing  NEURO: alert & oriented x 3 with fluent speech, no focal motor/sensory deficits EXTREMITIES: No lower extremity edema  LABORATORY DATA:  I have reviewed the data as listed CMP Latest Ref Rng & Units 05/03/2019  Glucose 70 - 99 mg/dL 98  BUN 6 - 20 mg/dL 14   Creatinine 0.44 - 1.00 mg/dL 0.73  Sodium 135 - 145 mmol/L 139  Potassium 3.5 - 5.1 mmol/L 4.2  Chloride 98 - 111 mmol/L 105  CO2 22 - 32 mmol/L 24  Calcium 8.9 - 10.3 mg/dL 9.3  Total Protein 6.5 - 8.1 g/dL 7.1  Total Bilirubin 0.3 - 1.2 mg/dL 0.4  Alkaline Phos 38 - 126 U/L 39  AST 15 - 41 U/L 13(L)  ALT 0 - 44 U/L 11    Lab Results  Component Value Date   WBC 7.4 05/03/2019   HGB 12.7 05/03/2019   HCT 38.8 05/03/2019   MCV 87.8 05/03/2019   PLT 256 05/03/2019   NEUTROABS 5.3 05/03/2019    ASSESSMENT & PLAN:  Malignant neoplasm of lower-outer quadrant of right breast of female, estrogen receptor positive ([place]) 04/25/2019:Right breast mass identified on clinical examination. Mammogram showed 1.6cm indeterminate mass in the lower outer right breast, no abnormal axillary lymph nodes. 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  Recommendations: 1. Breast conserving surgery8/09/2019 2. Oncotype DX testing to determine if chemotherapy would be of any benefit followed by 3. Adjuvant radiation therapy 06/30/2019- 4. 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 T1CN0 stage Ia  Adjuvant radiation 06/30/2019-07/28/2019  Treatment plan: Adjuvant antiestrogen therapy with tamoxifen  She took tamoxifen at 10 mg dose and could not tolerate it.  It is currently discontinued.  Tamoxifen Toxicities: 1.  Severe hot flashes 2.  Mood swings 3.  Anxiety attacks 4.  Vaginal dryness  Based on the symptoms we discontinue tamoxifen currently. I discussed with her that if in 3 to 4 months she would like to restart it at 5 mg every other day, she can try  that. I explained to her that her risk of recurrence would be around 10% in the next 9 years if she did not take tamoxifen.  This is based on Oncotype DX test.  If she cannot take tamoxifen there are no other options and hence she would then discontinue it permanently.  Breast cancer surveillance: 1. Breast exam 2. Mammogram will be done in July 2021. 3.  Surveillance with annual MRIs will be done in January 2022  Return to clinic in 6 months for follow-up and after that we can see her once a year.    No orders of the defined types were placed in this encounter.  The patient has a good understanding of the overall plan. she agrees with it. she will call with any problems that may develop before the next visit here.  Nicholas Lose,  MD 09/21/2019  Julious Oka Dorshimer, am acting as scribe for Dr. Nicholas Lose.  I have reviewed the above documentation for accuracy and completeness, and I agree with the above.

## 2019-09-21 ENCOUNTER — Inpatient Hospital Stay: Payer: BC Managed Care – PPO | Attending: Hematology and Oncology | Admitting: Hematology and Oncology

## 2019-09-21 ENCOUNTER — Telehealth: Payer: Self-pay | Admitting: Hematology and Oncology

## 2019-09-21 ENCOUNTER — Other Ambulatory Visit: Payer: Self-pay

## 2019-09-21 ENCOUNTER — Ambulatory Visit: Payer: BC Managed Care – PPO | Admitting: Rehabilitation

## 2019-09-21 ENCOUNTER — Encounter: Payer: Self-pay | Admitting: Rehabilitation

## 2019-09-21 DIAGNOSIS — Z79899 Other long term (current) drug therapy: Secondary | ICD-10-CM | POA: Insufficient documentation

## 2019-09-21 DIAGNOSIS — I890 Lymphedema, not elsewhere classified: Secondary | ICD-10-CM

## 2019-09-21 DIAGNOSIS — C50511 Malignant neoplasm of lower-outer quadrant of right female breast: Secondary | ICD-10-CM

## 2019-09-21 DIAGNOSIS — Z170 Estrogen receptor positive status [ER+]: Secondary | ICD-10-CM | POA: Diagnosis not present

## 2019-09-21 DIAGNOSIS — Z79810 Long term (current) use of selective estrogen receptor modulators (SERMs): Secondary | ICD-10-CM | POA: Insufficient documentation

## 2019-09-21 DIAGNOSIS — Z483 Aftercare following surgery for neoplasm: Secondary | ICD-10-CM

## 2019-09-21 NOTE — Therapy (Signed)
[place], [place], [address] Phone: [phone]   Fax:  [phone]  Physical Therapy Treatment  Patient Details  Name: Holly Jenkins MRN: [id number] Date of Birth: 03-06-1976 Referring Provider (PT): Dr. Isidore Moos  (also Dr. [name] and Dr. Lindi Adie)   Encounter Date: 09/21/2019  PT End of Session - 09/21/19 1603    Visit Number  9    Number of Visits  17    Date for PT Re-Evaluation  10/16/19    PT Start Time  1600    PT Stop Time  1650    PT Time Calculation (min)  50 min    Activity Tolerance  Patient tolerated treatment well    Behavior During Therapy  [facility] for tasks assessed/performed       Past Medical History:  Diagnosis Date  . Anxiety   . Breast cancer ([place]) 2020  . Fibromyalgia   . 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 Migraines   . Mold exposure    toxicity    Past Surgical History:  Procedure Laterality Date  . BREAST LUMPECTOMY WITH RADIOACTIVE SEED AND SENTINEL LYMPH NODE BIOPSY Right 05/24/2019   Procedure: RIGHT BREAST LUMPECTOMY WITH RADIOACTIVE SEED AND RIGHT AXILLARY DEEP SENTINEL LYMPH NODE BIOPSY, INJECT BLUE DYE;  Surgeon: Fanny Skates, MD;  Location: [facility];  Service: General;  Laterality: Right;  . CERVICAL SPINE SURGERY     c5-c6-c7 fusion with disc transplants    There were no vitals filed for this visit.  Subjective Assessment - 09/21/19 1602    Subjective  My shirt is great.  the sleeve makes my hand feel cold after 2 hours.    Pertinent History  right breast cancer diagnosed on 04/25/2019 with right lumpectomy on 05/24/2019 with 0/4 nodes removed followed by radiation from 9/18- 07/26/2019 she will have taxoifen but no other chemo    Patient Stated Goals  to get rid of the pain and swelling in her armpit    Currently in Pain?  No/denies                       [facility] Adult PT  Treatment/Exercise - 09/21/19 0001      Exercises   Exercises  Shoulder      Shoulder Exercises: Supine   Horizontal ABduction  Both;5 reps    Theraband Level (Shoulder Horizontal ABduction)  Level 1 (Yellow)    External Rotation  Both;5 reps    Theraband Level (Shoulder External Rotation)  Level 1 (Yellow)    Flexion  Both;5 reps;Theraband    Theraband Level (Shoulder Flexion)  Level 1 (Yellow)      Manual Therapy   Soft tissue mobilization  along right breast lateral border into serratus with most tenderness and tightness here today, pectoralis and lateral latissimus border with arm overhead into abd and ER position and in sidelying to the latissimus but without significant tightness today    Manual Lymphatic Drainage (MLD)  incorporated stationary circles at the right lateral chest towards the Rt axillary nodes     Passive ROM  to the Rt shoulder into flexion and abduction and ER                  PT Long Term Goals - 09/18/19 [id number]      PT LONG TERM GOAL #1   Title  Pt wil report the fullness in her right axilla  is dereased by 75%    Baseline  50% improvement at this time-09/11/19, 09/18/19- 70% improvement    Time  4    Period  Weeks    Status  On-going      PT LONG TERM GOAL #2   Title  Pt will be independent in self manual lymph drainage and use of compression to manage fullness in right axilla and breast    Baseline  09/18/19- pt is independent in this    Period  Weeks    Status  Achieved      PT LONG TERM GOAL #3   Title  Pt will be independent in a gradually progressive exercise program    Baseline  Pt reports independence with her own strenthening exercise program, has been instructed in Rt lateral trunk stretches and today new axillary stretches-09/11/19    Time  4    Period  Weeks    Status  Achieved      PT LONG TERM GOAL #4   Title  Pt will decrease Quick DASH score to < 7 indicating an improvment in function of left arm    Baseline  15.91, 09/18/19-  18.18    Time  4    Period  Weeks    Status  On-going      PT LONG TERM GOAL #5   Title  Pt will be able to sleep on right without increased pain, discomfort and tightnening.    Time  4    Period  Weeks    Status  New    Target Date  10/16/19            Plan - 09/21/19 1643    Clinical Impression Statement  pt with less tenderness and no palpable knots in the latissimus today improved from last visit.  pt did have some tenderness and tightness only in the serratus towards the lateral breast today.  Added supine scap not diagonals and only 5 reps each to see if it will aggravate neck muscles from old cervical fusion    PT Frequency  2x / week    PT Duration  4 weeks    PT Treatment/Interventions  ADLs/Self Care Home Management;Therapeutic activities;Therapeutic exercise;Neuromuscular re-education;Patient/family education;Orthotic Fit/Training;Manual techniques;Manual lymph drainage;Compression bandaging;Scar mobilization;Passive range of motion;Taping    PT Next Visit Plan  Rt breast and upper quadrant STM as needed, MLD to the puffiness in the lateral chest PRN, continue scapular strengthening    PT Home Exercise Plan  Access Code: [email or web address]    Consulted and Agree with Plan of Care  Patient       Patient will benefit from skilled therapeutic intervention in order to improve the following deficits and impairments:     Visit Diagnosis: Lymphedema, not elsewhere classified  Aftercare following surgery for neoplasm     Problem List Patient Active Problem List   Diagnosis Date Noted  . Genetic testing 05/10/2019  . Malignant neoplasm of lower-outer quadrant of right breast of female, estrogen receptor positive ([place]) 04/25/2019  . Chronic migraine w/o aura w/o status migrainosus, not intractable 05/26/2016  . Cervical dystonia 05/26/2016    Stark Bray 09/21/2019, 4:56 PM  [place],  [place], [address] Phone: [phone]   Fax:  [phone]  Name: Holly Jenkins MRN: [id number] Date of Birth: April 07, 1976

## 2019-09-21 NOTE — Patient Instructions (Signed)
Access Code: [email or web address]  URL: [email or web address]  Date: 09/21/2019  Prepared by: Shan Levans   Exercises  Supine Shoulder Horizontal Abduction with Resistance - 5-10 reps - 1-3 sets - 2 second hold - 1x daily - 3x weekly  Supine Shoulder External Rotation with Resistance - 5-10 reps - 1-3 sets - 2-3 second hold - 1x daily - 3x weekly  Supine Shoulder Flexion Extension AAROM with Dowel - 5-10 reps - 1-3 sets - 2-3 second hold - 1x daily - 3x weekly

## 2019-09-21 NOTE — Telephone Encounter (Signed)
I talk with patient regarding schedule  

## 2019-09-21 NOTE — Assessment & Plan Note (Signed)
04/25/2019:Right breast mass identified on clinical examination. Mammogram showed 1.6cm indeterminate mass in the lower outer right breast, no abnormal axillary lymph nodes. 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  Recommendations: 1. Breast conserving surgery8/09/2019 2. Oncotype DX testing to determine if chemotherapy would be of any benefit followed by 3. Adjuvant radiation therapy 06/30/2019- 4. 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 T1CN0 stage Ia  Adjuvant radiation 06/30/2019-07/28/2019  Treatment plan: Adjuvant antiestrogen therapy with tamoxifen 20 mg daily x10 years (patient will start at 10 mg daily and increase to 20 if she tolerates it)  Tamoxifen Toxicities:  Breast cancer surveillance: 1. Breast exam 09/21/2019: Benign 2. Mammogram will be done in August 2021.  Return to clinic in 6 months for follow-up and after that we can see her once a year.

## 2019-09-25 NOTE — Telephone Encounter (Signed)
Noted, thank you. Will apply for new authorization closer to apt time in 2021. DW

## 2019-09-26 ENCOUNTER — Ambulatory Visit: Payer: BC Managed Care – PPO | Admitting: Physical Therapy

## 2019-09-26 ENCOUNTER — Encounter: Payer: Self-pay | Admitting: Physical Therapy

## 2019-09-26 ENCOUNTER — Other Ambulatory Visit: Payer: Self-pay

## 2019-09-26 DIAGNOSIS — I890 Lymphedema, not elsewhere classified: Secondary | ICD-10-CM | POA: Diagnosis not present

## 2019-09-26 DIAGNOSIS — Z483 Aftercare following surgery for neoplasm: Secondary | ICD-10-CM

## 2019-09-26 NOTE — Therapy (Signed)
[place], [place], [address] Phone: [phone]   Fax:  [phone]  Physical Therapy Treatment  Patient Details  Name: Holly Jenkins MRN: [id number] Date of Birth: 18-Sep-1976 Referring Provider (PT): Dr. Isidore Moos  (also Dr. [name] and Dr. Lindi Adie)   Encounter Date: 09/26/2019  PT End of Session - 09/26/19 1656    Visit Number  10    Number of Visits  17    Date for PT Re-Evaluation  10/16/19    PT Start Time  1602    PT Stop Time  1650    PT Time Calculation (min)  48 min    Activity Tolerance  Patient tolerated treatment well    Behavior During Therapy  [facility] for tasks assessed/performed       Past Medical History:  Diagnosis Date  . Anxiety   . Breast cancer ([place]) 2020  . Fibromyalgia   . 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 Migraines   . Mold exposure    toxicity    Past Surgical History:  Procedure Laterality Date  . BREAST LUMPECTOMY WITH RADIOACTIVE SEED AND SENTINEL LYMPH NODE BIOPSY Right 05/24/2019   Procedure: RIGHT BREAST LUMPECTOMY WITH RADIOACTIVE SEED AND RIGHT AXILLARY DEEP SENTINEL LYMPH NODE BIOPSY, INJECT BLUE DYE;  Surgeon: Fanny Skates, MD;  Location: [facility];  Service: General;  Laterality: Right;  . CERVICAL SPINE SURGERY     c5-c6-c7 fusion with disc transplants    There were no vitals filed for this visit.  Subjective Assessment - 09/26/19 1602    Subjective  I brought my sleeve for you to see if it is too small.    Pertinent History  right breast cancer diagnosed on 04/25/2019 with right lumpectomy on 05/24/2019 with 0/4 nodes removed followed by radiation from 9/18- 07/26/2019 she will have taxoifen but no other chemo    Patient Stated Goals  to get rid of the pain and swelling in her armpit    Currently in Pain?  Yes    Pain Score  1     Pain Location  Axilla    Pain Orientation  Right    Pain Descriptors  / Indicators  Discomfort                       [facility] Adult PT Treatment/Exercise - 09/26/19 0001      Manual Therapy   Edema Management  assessed pt's compression sleeve for proper fit and sizing, cut 1/2 grey foam and created a bracelet for pt to wear under wrist area of sleeve to help decrease compression in this area since her hands gets very cold after wearing sleeve for 2 hrs    Soft tissue mobilization  in supine to right lateral trunk with arm in abduction and ER, and along right lateral breast- numerous knots palpable along ribs in this area that did relieve with massage; in left sidelying to R lats and serratus in areas of tightness and tenderness, also briefly in supine to lumpectomy scar in area of fibrosis                  PT Long Term Goals - 09/18/19 0811      PT LONG TERM GOAL #1   Title  Pt wil report the fullness in her right axilla is dereased by 75%    Baseline  50% improvement at this time-09/11/19, 09/18/19- 70% improvement  Time  4    Period  Weeks    Status  On-going      PT LONG TERM GOAL #2   Title  Pt will be independent in self manual lymph drainage and use of compression to manage fullness in right axilla and breast    Baseline  09/18/19- pt is independent in this    Period  Weeks    Status  Achieved      PT LONG TERM GOAL #3   Title  Pt will be independent in a gradually progressive exercise program    Baseline  Pt reports independence with her own strenthening exercise program, has been instructed in Rt lateral trunk stretches and today new axillary stretches-09/11/19    Time  4    Period  Weeks    Status  Achieved      PT LONG TERM GOAL #4   Title  Pt will decrease Quick DASH score to < 7 indicating an improvment in function of left arm    Baseline  15.91, 09/18/19- 18.18    Time  4    Period  Weeks    Status  On-going      PT LONG TERM GOAL #5   Title  Pt will be able to sleep on right without increased pain,  discomfort and tightnening.    Time  4    Period  Weeks    Status  New    Target Date  10/16/19            Plan - 09/26/19 1657    Clinical Impression Statement  Pt did not have any increase in neck pain from new scapular stabilization exercises last session. She reports she has done them once at home. She also reports decreased fullness at right pec today but is still having tightness across pec and lateral trunk. Spent session focusing on decreasing muscle tightness along right lateral trunk in area of serratus and lats with improvement noted at end of session.    PT Frequency  2x / week    PT Duration  4 weeks    PT Treatment/Interventions  ADLs/Self Care Home Management;Therapeutic activities;Therapeutic exercise;Neuromuscular re-education;Patient/family education;Orthotic Fit/Training;Manual techniques;Manual lymph drainage;Compression bandaging;Scar mobilization;Passive range of motion;Taping    PT Next Visit Plan  see if foam band helped decrease discomfort from sleeve, Rt breast and upper quadrant STM as needed, MLD to the puffiness in the lateral chest PRN, continue scapular strengthening    PT Home Exercise Plan  Access Code: [email or web address]    Consulted and Agree with Plan of Care  Patient       Patient will benefit from skilled therapeutic intervention in order to improve the following deficits and impairments:  Increased edema, Impaired perceived functional ability, Decreased scar mobility, Decreased knowledge of precautions, Decreased knowledge of use of DME, Increased fascial restricitons, Impaired UE functional use, Postural dysfunction, Pain  Visit Diagnosis: Aftercare following surgery for neoplasm     Problem List Patient Active Problem List   Diagnosis Date Noted  . Genetic testing 05/10/2019  . Malignant neoplasm of lower-outer quadrant of right breast of female, estrogen receptor positive ([place]) 04/25/2019  . Chronic migraine w/o aura w/o status migrainosus, not  intractable 05/26/2016  . Cervical dystonia 05/26/2016    Allyson Sabal [facility] 09/26/2019, 4:59 PM  [place], [place], [address] Phone: [phone]   Fax:  [phone]  Name: Holly Jenkins MRN: [id number] Date of Birth: [birth year]  Allyson Sabal [place], [place] 09/26/19 4:59 PM

## 2019-09-28 ENCOUNTER — Ambulatory Visit: Payer: BC Managed Care – PPO | Admitting: Physical Therapy

## 2019-09-28 ENCOUNTER — Encounter: Payer: Self-pay | Admitting: Physical Therapy

## 2019-09-28 ENCOUNTER — Other Ambulatory Visit: Payer: Self-pay

## 2019-09-28 DIAGNOSIS — I890 Lymphedema, not elsewhere classified: Secondary | ICD-10-CM | POA: Diagnosis not present

## 2019-09-28 DIAGNOSIS — Z483 Aftercare following surgery for neoplasm: Secondary | ICD-10-CM

## 2019-09-28 NOTE — Patient Instructions (Signed)

## 2019-09-28 NOTE — Therapy (Signed)
[place], [place], [address] Phone: [phone]   Fax:  [phone]  Physical Therapy Treatment  Patient Details  Name: Holly Jenkins MRN: [id number] Date of Birth: 03/11/1976 Referring Provider (PT): Dr. Isidore Moos  (also Dr. [name] and Dr. Lindi Adie)   Encounter Date: 09/28/2019  PT End of Session - 09/28/19 1648    Visit Number  11    Number of Visits  17    Date for PT Re-Evaluation  10/16/19    PT Start Time  1603    PT Stop Time  1648    PT Time Calculation (min)  45 min    Activity Tolerance  Patient tolerated treatment well    Behavior During Therapy  [facility] for tasks assessed/performed       Past Medical History:  Diagnosis Date  . Anxiety   . Breast cancer ([place]) 2020  . Fibromyalgia   . 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 Migraines   . Mold exposure    toxicity    Past Surgical History:  Procedure Laterality Date  . BREAST LUMPECTOMY WITH RADIOACTIVE SEED AND SENTINEL LYMPH NODE BIOPSY Right 05/24/2019   Procedure: RIGHT BREAST LUMPECTOMY WITH RADIOACTIVE SEED AND RIGHT AXILLARY DEEP SENTINEL LYMPH NODE BIOPSY, INJECT BLUE DYE;  Surgeon: Fanny Skates, MD;  Location: [facility];  Service: General;  Laterality: Right;  . CERVICAL SPINE SURGERY     c5-c6-c7 fusion with disc transplants    There were no vitals filed for this visit.  Subjective Assessment - 09/28/19 1604    Subjective  The foam really helped and I could wear the sleeve for longer.    Pertinent History  right breast cancer diagnosed on 04/25/2019 with right lumpectomy on 05/24/2019 with 0/4 nodes removed followed by radiation from 9/18- 07/26/2019 she will have taxoifen but no other chemo    Patient Stated Goals  to get rid of the pain and swelling in her armpit    Currently in Pain?  Yes    Pain Score  1     Pain Location  Axilla    Pain Orientation  Right    Pain  Descriptors / Indicators  Sore    Pain Type  Surgical pain                       [facility] Adult PT Treatment/Exercise - 09/28/19 0001      Shoulder Exercises: Supine   Horizontal ABduction  Strengthening;Both;10 reps;Theraband   pt returned therapist demo   Theraband Level (Shoulder Horizontal ABduction)  Level 2 (Red)    External Rotation  Strengthening;Both;10 reps;Theraband   pt returned therapist demo   Theraband Level (Shoulder External Rotation)  Level 2 (Red)    Flexion  Strengthening;Both;10 reps;Theraband   narrow and wide grip, pt returned therapist demo   Theraband Level (Shoulder Flexion)  Level 2 (Red)    Diagonals  Strengthening;Both;10 reps;Theraband   pt returned therapist demo   Theraband Level (Shoulder Diagonals)  Level 2 (Red)      Manual Therapy   Soft tissue mobilization  in supine to right lateral trunk with arm in abduction and ER, and along right lateral breast- numerous knots palpable along ribs in this area that did relieve with massage; in left sidelying to R lats and serratus in areas of tightness and tenderness, also briefly in supine to lumpectomy scar in area of fibrosis  PT Long Term Goals - 09/18/19 0811      PT LONG TERM GOAL #1   Title  Pt wil report the fullness in her right axilla is dereased by 75%    Baseline  50% improvement at this time-09/11/19, 09/18/19- 70% improvement    Time  4    Period  Weeks    Status  On-going      PT LONG TERM GOAL #2   Title  Pt will be independent in self manual lymph drainage and use of compression to manage fullness in right axilla and breast    Baseline  09/18/19- pt is independent in this    Period  Weeks    Status  Achieved      PT LONG TERM GOAL #3   Title  Pt will be independent in a gradually progressive exercise program    Baseline  Pt reports independence with her own strenthening exercise program, has been instructed in Rt lateral trunk stretches and today  new axillary stretches-09/11/19    Time  4    Period  Weeks    Status  Achieved      PT LONG TERM GOAL #4   Title  Pt will decrease Quick DASH score to < 7 indicating an improvment in function of left arm    Baseline  15.91, 09/18/19- 18.18    Time  4    Period  Weeks    Status  On-going      PT LONG TERM GOAL #5   Title  Pt will be able to sleep on right without increased pain, discomfort and tightnening.    Time  4    Period  Weeks    Status  New    Target Date  10/16/19            Plan - 09/28/19 1649    Clinical Impression Statement  Instructed pt in all supine scap exercises and increased to red theraband today since pt did not have any neck pain. Issued HEP for this. Continued with soft tissue mobilization to right lateral trunk in area of tightness with numerous knots palpable that eased by end of session. Pt does not feel like she is having trouble with lateral trunk swelling at this time.    PT Frequency  2x / week    PT Duration  4 weeks    PT Treatment/Interventions  ADLs/Self Care Home Management;Therapeutic activities;Therapeutic exercise;Neuromuscular re-education;Patient/family education;Orthotic Fit/Training;Manual techniques;Manual lymph drainage;Compression bandaging;Scar mobilization;Passive range of motion;Taping    PT Next Visit Plan  scap strengthening and neck pain?,  Rt breast and upper quadrant STM as needed, MLD to the puffiness in the lateral chest PRN, continue scapular strengthening    PT Home Exercise Plan  Access Code: [email or web address], supine scap ex    Consulted and Agree with Plan of Care  Patient       Patient will benefit from skilled therapeutic intervention in order to improve the following deficits and impairments:  Increased edema, Impaired perceived functional ability, Decreased scar mobility, Decreased knowledge of precautions, Decreased knowledge of use of DME, Increased fascial restricitons, Impaired UE functional use, Postural dysfunction,  Pain  Visit Diagnosis: Aftercare following surgery for neoplasm     Problem List Patient Active Problem List   Diagnosis Date Noted  . Genetic testing 05/10/2019  . Malignant neoplasm of lower-outer quadrant of right breast of female, estrogen receptor positive ([place]) 04/25/2019  . Chronic migraine w/o aura w/o status migrainosus, not intractable 05/26/2016  .  Cervical dystonia 05/26/2016    Allyson Sabal [facility] 09/28/2019, 4:53 PM  [place], [place], [address] Phone: [phone]   Fax:  [phone]  Name: NHYIRA CONLIFFE MRN: [id number] Date of Birth: Jun 22, 1976  Manus Gunning, PT 09/28/19 4:53 PM

## 2019-09-29 NOTE — Progress Notes (Signed)
  Patient Name: BRECKYN TROYER MRN: [id number] DOB: 03-10-1976 Referring Physician: Nicholas Lose (Profile Not Attached) Date of Service: 07/26/2019 [facility], [place]                                                        End Of Treatment Note  Diagnoses: C50.511-Malignant neoplasm of lower-outer quadrant of right female breast  Cancer Staging: Malignant neoplasm of lower-outer quadrant of right breast of female, estrogen receptor positive ([place]) Staging form: Breast, AJCC 8th Edition - Clinical stage from 05/03/2019: Stage IA (cT1c, cN0, cM0, G2, ER+, PR+, HER2-) - Signed by Nicholas Lose, MD on 05/03/2019 - Pathologic stage from 05/24/2019: Stage IA (pT1c, pN0, cM0, G1, ER+, PR+, HER2-) - Signed by Gardenia Phlegm, NP on 06/07/2019  Intent: Curative  Radiation Treatment Dates: 06/29/2019 through 07/26/2019 Site Technique Total Dose (Gy) Dose per Fx (Gy) Completed Fx Beam Energies  Breast: Breast_Rt 3D 40.05/40.05 2.67 15/15 6X  Breast: Breast_Rt_Bst specialPort 10/10 2 5/5 9E   Narrative: The patient tolerated radiation therapy relatively well.   Plan: The patient will follow-up with radiation oncology in [place] as needed. PT referral for mild lymphedema in treatment fields. -----------------------------------  SEppie Gibson MD

## 2019-10-03 ENCOUNTER — Ambulatory Visit: Payer: BC Managed Care – PPO | Admitting: Physical Therapy

## 2019-10-03 ENCOUNTER — Encounter: Payer: Self-pay | Admitting: Physical Therapy

## 2019-10-03 ENCOUNTER — Other Ambulatory Visit: Payer: Self-pay

## 2019-10-03 DIAGNOSIS — I890 Lymphedema, not elsewhere classified: Secondary | ICD-10-CM | POA: Diagnosis not present

## 2019-10-03 DIAGNOSIS — Z483 Aftercare following surgery for neoplasm: Secondary | ICD-10-CM

## 2019-10-03 NOTE — Therapy (Signed)
[place], [place], [address] Phone: [phone]   Fax:  [phone]  Physical Therapy Treatment  Patient Details  Name: Holly Jenkins MRN: [id number] Date of Birth: [birth year] Referring Provider (PT): Dr. Isidore Moos  (also Dr. [name] and Dr. Lindi Adie)   Encounter Date: 10/03/2019  PT End of Session - 10/03/19 1657    Visit Number  12    Number of Visits  17    Date for PT Re-Evaluation  10/16/19    PT Start Time  1603    PT Stop Time  1650    PT Time Calculation (min)  47 min    Activity Tolerance  Patient tolerated treatment well    Behavior During Therapy  [facility] for tasks assessed/performed       Past Medical History:  Diagnosis Date  . Anxiety   . Breast cancer ([place]) 2020  . Fibromyalgia   . 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 Migraines   . Mold exposure    toxicity    Past Surgical History:  Procedure Laterality Date  . BREAST LUMPECTOMY WITH RADIOACTIVE SEED AND SENTINEL LYMPH NODE BIOPSY Right 05/24/2019   Procedure: RIGHT BREAST LUMPECTOMY WITH RADIOACTIVE SEED AND RIGHT AXILLARY DEEP SENTINEL LYMPH NODE BIOPSY, INJECT BLUE DYE;  Surgeon: Fanny Skates, MD;  Location: [facility];  Service: General;  Laterality: Right;  . CERVICAL SPINE SURGERY     c5-c6-c7 fusion with disc transplants    There were no vitals filed for this visit.  Subjective Assessment - 10/03/19 1604    Subjective  I am not doing good. My armpit feels more swollen. My breast feels more fluidy. I have not done any of the exercises.    Pertinent History  right breast cancer diagnosed on 04/25/2019 with right lumpectomy on 05/24/2019 with 0/4 nodes removed followed by radiation from 9/18- 07/26/2019 she will have taxoifen but no other chemo    Patient Stated Goals  to get rid of the pain and swelling in her armpit    Currently in Pain?  Yes    Pain Score  3     Pain  Location  Axilla    Pain Orientation  Right    Pain Descriptors / Indicators  Heaviness;Sore                       [facility] Adult PT Treatment/Exercise - 10/03/19 0001      Manual Therapy   Soft tissue mobilization  gently to area of cord near SLNB scar    Manual Lymphatic Drainage (MLD)  in supine: short neck, 5 diaphragmatic breaths, right inguinal nodes and establishment of axillo inguinal pathway, left axillary nodes and establishment of inter axillary pathway, left breast working in area of fibrosis/possible seroma in outer breast moving fluid towards pathways then retracing all steps                  PT Long Term Goals - 09/18/19 0811      PT LONG TERM GOAL #1   Title  Pt wil report the fullness in her right axilla is dereased by 75%    Baseline  50% improvement at this time-09/11/19, 09/18/19- 70% improvement    Time  4    Period  Weeks    Status  On-going      PT LONG TERM GOAL #2   Title  Pt will be independent  in self manual lymph drainage and use of compression to manage fullness in right axilla and breast    Baseline  09/18/19- pt is independent in this    Period  Weeks    Status  Achieved      PT LONG TERM GOAL #3   Title  Pt will be independent in a gradually progressive exercise program    Baseline  Pt reports independence with her own strenthening exercise program, has been instructed in Rt lateral trunk stretches and today new axillary stretches-09/11/19    Time  4    Period  Weeks    Status  Achieved      PT LONG TERM GOAL #4   Title  Pt will decrease Quick DASH score to < 7 indicating an improvment in function of left arm    Baseline  15.91, 09/18/19- 18.18    Time  4    Period  Weeks    Status  On-going      PT LONG TERM GOAL #5   Title  Pt will be able to sleep on right without increased pain, discomfort and tightnening.    Time  4    Period  Weeks    Status  New    Target Date  10/16/19            Plan - 10/03/19 1658     Clinical Impression Statement  Pt had increased feeling of swelling in left breast that began a few hours after last session. The only new thing that was done last session was supine scap exercises. Pt has not done any exercises since last session. Right lateral breast felt hard towards lateral aspect and almost feels like it could be a possible seroma that has formed. Educated pt to wear her compression shirt or bra as much as possible with chip pack in place to help decrease swelling. Educated pt to hold off on exercises and only do self MLD to see if this will control the new pain that she is having. If she is feeling less pain and discomfort at next visit she can begin to do the exercises again. Will hold off on soft tissue mobilization to right lateral trunk at this time to see if it was possibly the exercises causing the new pain or if maybe she just developed a seroma.    PT Frequency  2x / week    PT Duration  4 weeks    PT Treatment/Interventions  ADLs/Self Care Home Management;Therapeutic activities;Therapeutic exercise;Neuromuscular re-education;Patient/family education;Orthotic Fit/Training;Manual techniques;Manual lymph drainage;Compression bandaging;Scar mobilization;Passive range of motion;Taping    PT Next Visit Plan  how is pain? did MLD and compression help? cut 1/2 grey foam and put in thick stockinette for pt to wear in bra, if pain is better pt can go back to exercises, hold off on STM to R lateral trunk until we see if exercises increased pain,   Rt breast and upper quadrant STM as needed, MLD to the puffiness in the lateral chest PRN, continue scapular strengthening    PT Home Exercise Plan  Access Code: [email or web address], supine scap ex    Consulted and Agree with Plan of Care  Patient       Patient will benefit from skilled therapeutic intervention in order to improve the following deficits and impairments:  Increased edema, Impaired perceived functional ability, Decreased scar mobility,  Decreased knowledge of precautions, Decreased knowledge of use of DME, Increased fascial restricitons, Impaired UE functional use, Postural dysfunction, Pain  Visit Diagnosis: Lymphedema, not elsewhere classified  Aftercare following surgery for neoplasm     Problem List Patient Active Problem List   Diagnosis Date Noted  . Genetic testing 05/10/2019  . Malignant neoplasm of lower-outer quadrant of right breast of female, estrogen receptor positive ([place]) 04/25/2019  . Chronic migraine w/o aura w/o status migrainosus, not intractable 05/26/2016  . Cervical dystonia 05/26/2016    Allyson Sabal [facility] 10/03/2019, 5:03 PM  [place], [place], [address] Phone: [phone]   Fax:  [phone]  Name: Holly Jenkins MRN: [id number] Date of Birth: 1975/12/29  Manus Gunning, PT 10/03/19 5:03 PM

## 2019-10-04 ENCOUNTER — Telehealth: Payer: Self-pay | Admitting: Adult Health

## 2019-10-04 NOTE — Telephone Encounter (Signed)
I talk with patient regarding reschedule °

## 2019-10-05 ENCOUNTER — Encounter: Payer: Self-pay | Admitting: Rehabilitation

## 2019-10-05 ENCOUNTER — Ambulatory Visit: Payer: BC Managed Care – PPO | Admitting: Rehabilitation

## 2019-10-05 ENCOUNTER — Other Ambulatory Visit: Payer: Self-pay

## 2019-10-05 DIAGNOSIS — I890 Lymphedema, not elsewhere classified: Secondary | ICD-10-CM | POA: Diagnosis not present

## 2019-10-05 DIAGNOSIS — Z483 Aftercare following surgery for neoplasm: Secondary | ICD-10-CM

## 2019-10-05 NOTE — Therapy (Signed)
[place], [place], [address] Phone: [phone]   Fax:  [phone]  Physical Therapy Treatment  Patient Details  Name: Holly Jenkins MRN: [id number] Date of Birth: May 21, 1976 Referring Provider (PT): Dr. Isidore Moos  (also Dr. [name] and Dr. Lindi Adie)   Encounter Date: 10/05/2019  PT End of Session - 10/05/19 0857    Visit Number  13    Number of Visits  17    Date for PT Re-Evaluation  10/16/19    PT Start Time  0811   arrived late   PT Stop Time  0855    PT Time Calculation (min)  44 min    Activity Tolerance  Patient tolerated treatment well    Behavior During Therapy  [facility] for tasks assessed/performed       Past Medical History:  Diagnosis Date  . Anxiety   . Breast cancer ([place]) 2020  . Fibromyalgia   . 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 Migraines   . Mold exposure    toxicity    Past Surgical History:  Procedure Laterality Date  . BREAST LUMPECTOMY WITH RADIOACTIVE SEED AND SENTINEL LYMPH NODE BIOPSY Right 05/24/2019   Procedure: RIGHT BREAST LUMPECTOMY WITH RADIOACTIVE SEED AND RIGHT AXILLARY DEEP SENTINEL LYMPH NODE BIOPSY, INJECT BLUE DYE;  Surgeon: Fanny Skates, MD;  Location: [place];  Service: General;  Laterality: Right;  . CERVICAL SPINE SURGERY     c5-c6-c7 fusion with disc transplants    There were no vitals filed for this visit.  Subjective Assessment - 10/05/19 0854    Subjective  It is better today but still more pain and numbness in the armpit and more fluid on the breast    Pertinent History  right breast cancer diagnosed on 04/25/2019 with right lumpectomy on 05/24/2019 with 0/4 nodes removed followed by radiation from 9/18- 07/26/2019 she will have taxoifen but no other chemo    Patient Stated Goals  to get rid of the pain and swelling in her armpit    Currently in Pain?  Yes    Pain Score  3     Pain Location   Axilla    Pain Orientation  Right    Pain Descriptors / Indicators  Aching    Pain Onset  More than a month ago    Pain Frequency  Constant                       [facility] Adult PT Treatment/Exercise - 10/05/19 0001      Manual Therapy   Edema Management  gave pt foam rectangle for lateral bra with instruction on use and printout on jovi pak mini axilla pad if interested    Manual Lymphatic Drainage (MLD)  in supine: short neck, superficial and deep abdominals, 5 diaphragmatic breaths, right inguinal nodes and establishment of axillo inguinal pathway, left axillary nodes and establishment of inter axillary pathway, left breast working in area of fibrosis/possible seroma in outer breast moving fluid towards pathways then retracing all steps. Also stationary circles towards right breast and into sidelying posterior interaxillary pathway. Stationary circles following scar tissue? from nipple to axilla                  PT Long Term Goals - 09/18/19 [id number]      PT LONG TERM GOAL #1   Title  Pt wil report the fullness in her right  axilla is dereased by 75%    Baseline  50% improvement at this time-09/11/19, 09/18/19- 70% improvement    Time  4    Period  Weeks    Status  On-going      PT LONG TERM GOAL #2   Title  Pt will be independent in self manual lymph drainage and use of compression to manage fullness in right axilla and breast    Baseline  09/18/19- pt is independent in this    Period  Weeks    Status  Achieved      PT LONG TERM GOAL #3   Title  Pt will be independent in a gradually progressive exercise program    Baseline  Pt reports independence with her own strenthening exercise program, has been instructed in Rt lateral trunk stretches and today new axillary stretches-09/11/19    Time  4    Period  Weeks    Status  Achieved      PT LONG TERM GOAL #4   Title  Pt will decrease Quick DASH score to < 7 indicating an improvment in function of left arm     Baseline  15.91, 09/18/19- 18.18    Time  4    Period  Weeks    Status  On-going      PT LONG TERM GOAL #5   Title  Pt will be able to sleep on right without increased pain, discomfort and tightnening.    Time  4    Period  Weeks    Status  New    Target Date  10/16/19            Plan - 10/05/19 0857    Clinical Impression Statement  Pt still with increased lateral breast swelling but improved per pt, increased numbness in the Rt axilla.  Seroma questionable but also with more palpable firmness from nipple to Rt axilla making cause hard to differentiate.  Discussed calling Dr. [name] with to check for possible seroma just in case aspiration is necessary but it is more mild today.    PT Frequency  2x / week    PT Duration  4 weeks    PT Treatment/Interventions  ADLs/Self Care Home Management;Therapeutic activities;Therapeutic exercise;Neuromuscular re-education;Patient/family education;Orthotic Fit/Training;Manual techniques;Manual lymph drainage;Compression bandaging;Scar mobilization;Passive range of motion;Taping    PT Next Visit Plan  if pain is better pt can go back to exercises, hold off on STM to R lateral trunk until we see if exercises increased pain,   Rt breast and upper quadrant STM as needed, MLD to the puffiness in the lateral chest PRN, continue scapular strengthening    PT Home Exercise Plan  Access Code: [email or web address], supine scap ex    Consulted and Agree with Plan of Care  Patient       Patient will benefit from skilled therapeutic intervention in order to improve the following deficits and impairments:     Visit Diagnosis: Lymphedema, not elsewhere classified  Aftercare following surgery for neoplasm     Problem List Patient Active Problem List   Diagnosis Date Noted  . Genetic testing 05/10/2019  . Malignant neoplasm of lower-outer quadrant of right breast of female, estrogen receptor positive ([place]) 04/25/2019  . Chronic migraine w/o aura w/o status  migrainosus, not intractable 05/26/2016  . Cervical dystonia 05/26/2016    Holly Jenkins 10/05/2019, 9:00 AM  [place], [place], [address] Phone: [phone]   Fax:  [phone]  Name: Holly Jenkins MRN: [id number] Date of Birth: 10/04/1976

## 2019-10-09 ENCOUNTER — Ambulatory Visit: Payer: BC Managed Care – PPO

## 2019-10-09 ENCOUNTER — Other Ambulatory Visit: Payer: Self-pay

## 2019-10-09 DIAGNOSIS — I890 Lymphedema, not elsewhere classified: Secondary | ICD-10-CM

## 2019-10-09 DIAGNOSIS — Z483 Aftercare following surgery for neoplasm: Secondary | ICD-10-CM

## 2019-10-09 NOTE — Therapy (Signed)
[place], [place], [address] Phone: [phone]   Fax:  [phone]  Physical Therapy Treatment  Patient Details  Name: Holly Jenkins MRN: [id number] Date of Birth: May 15, 1976 Referring Provider (PT): Dr. Isidore Moos  (also Dr. [name] and Dr. Lindi Adie)   Encounter Date: 10/09/2019  PT End of Session - 10/09/19 1706    Visit Number  14    Number of Visits  17    Date for PT Re-Evaluation  10/16/19    PT Start Time  1606    PT Stop Time  1704    PT Time Calculation (min)  58 min    Activity Tolerance  Patient tolerated treatment well    Behavior During Therapy  [facility] for tasks assessed/performed       Past Medical History:  Diagnosis Date  . Anxiety   . Breast cancer ([place]) 2020  . Fibromyalgia   . 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 Migraines   . Mold exposure    toxicity    Past Surgical History:  Procedure Laterality Date  . BREAST LUMPECTOMY WITH RADIOACTIVE SEED AND SENTINEL LYMPH NODE BIOPSY Right 05/24/2019   Procedure: RIGHT BREAST LUMPECTOMY WITH RADIOACTIVE SEED AND RIGHT AXILLARY DEEP SENTINEL LYMPH NODE BIOPSY, INJECT BLUE DYE;  Surgeon: Fanny Skates, MD;  Location: [facility];  Service: General;  Laterality: Right;  . CERVICAL SPINE SURGERY     c5-c6-c7 fusion with disc transplants    There were no vitals filed for this visit.  Subjective Assessment - 10/09/19 1613    Subjective  My Rt axilla pain is gone, I just stiull have some numbness. My Rt breast feels really full today and I did get an appt with my surgeon for them to check if I have a seroma on 10/12/19. And I've been wearing the compression foam she gave me last time and I love that!    Pertinent History  right breast cancer diagnosed on 04/25/2019 with right lumpectomy on 05/24/2019 with 0/4 nodes removed followed by radiation from 9/18- 07/26/2019 she will have taxoifen but  no other chemo    Patient Stated Goals  to get rid of the pain and swelling in her armpit                       [facility] Adult PT Treatment/Exercise - 10/09/19 0001      Manual Therapy   Manual Lymphatic Drainage (MLD)  in supine: short neck, superficial and deep abdominals, 5 diaphragmatic breaths, right inguinal nodes and establishment of axillo inguinal pathway, left axillary nodes and establishment of inter axillary pathway, right breast working in area of fibrosis/possible seroma in outer breast moving fluid towards pathways then retracing all steps. Into Lt sidelying for posterior interaxillary pathway. Stationary circles following scar tissue? from nipple to axilla                  PT Long Term Goals - 09/18/19 0811      PT LONG TERM GOAL #1   Title  Pt wil report the fullness in her right axilla is dereased by 75%    Baseline  50% improvement at this time-09/11/19, 09/18/19- 70% improvement    Time  4    Period  Weeks    Status  On-going      PT LONG TERM GOAL #2   Title  Pt will be independent in self  manual lymph drainage and use of compression to manage fullness in right axilla and breast    Baseline  09/18/19- pt is independent in this    Period  Weeks    Status  Achieved      PT LONG TERM GOAL #3   Title  Pt will be independent in a gradually progressive exercise program    Baseline  Pt reports independence with her own strenthening exercise program, has been instructed in Rt lateral trunk stretches and today new axillary stretches-09/11/19    Time  4    Period  Weeks    Status  Achieved      PT LONG TERM GOAL #4   Title  Pt will decrease Quick DASH score to < 7 indicating an improvment in function of left arm    Baseline  15.91, 09/18/19- 18.18    Time  4    Period  Weeks    Status  On-going      PT LONG TERM GOAL #5   Title  Pt will be able to sleep on right without increased pain, discomfort and tightnening.    Time  4    Period  Weeks     Status  New    Target Date  10/16/19            Plan - 10/09/19 1707    Clinical Impression Statement  Continued with manual lymph drainage focusing on lateral aspect of Rt breast where palpable firmness in 2 locations. Superior may or may not be seroma, butpt has apt with surgeon Thursday to find out. Inferior area of firmness appears more like scar tissue as this is under incision. Pts axillary pain has resolved but her numbness is still present.    Personal Factors and Comorbidities  Comorbidity 2    Comorbidities  surgery, radiation    Examination-Activity Limitations  Other   wearing clothes as they can be painful   Stability/Clinical Decision Making  Stable/Uncomplicated    Rehab Potential  Excellent    PT Frequency  2x / week    PT Duration  4 weeks    PT Treatment/Interventions  ADLs/Self Care Home Management;Therapeutic activities;Therapeutic exercise;Neuromuscular re-education;Patient/family education;Orthotic Fit/Training;Manual techniques;Manual lymph drainage;Compression bandaging;Scar mobilization;Passive range of motion;Taping    PT Next Visit Plan  Pt to make 1-2 visits for next week with probable D/C then. if pain is better pt can go back to exercises, hold off on STM to R lateral trunk until we see if exercises increased pain,   Rt breast and upper quadrant STM as needed, MLD to the puffiness in the lateral chest PRN, continue scapular strengthening    Consulted and Agree with Plan of Care  Patient       Patient will benefit from skilled therapeutic intervention in order to improve the following deficits and impairments:  Increased edema, Impaired perceived functional ability, Decreased scar mobility, Decreased knowledge of precautions, Decreased knowledge of use of DME, Increased fascial restricitons, Impaired UE functional use, Postural dysfunction, Pain  Visit Diagnosis: Lymphedema, not elsewhere classified  Aftercare following surgery for  neoplasm     Problem List Patient Active Problem List   Diagnosis Date Noted  . Genetic testing 05/10/2019  . Malignant neoplasm of lower-outer quadrant of right breast of female, estrogen receptor positive ([place]) 04/25/2019  . Chronic migraine w/o aura w/o status migrainosus, not intractable 05/26/2016  . Cervical dystonia 05/26/2016    [name], PTA 10/09/2019, 5:20 PM  [facility]  [place], [place], [address] Phone: [phone]   Fax:  [phone]  Name: Holly Jenkins MRN: [id number] Date of Birth: 04/14/1976

## 2019-10-17 ENCOUNTER — Other Ambulatory Visit: Payer: Self-pay | Admitting: General Surgery

## 2019-10-17 ENCOUNTER — Encounter: Payer: Self-pay | Admitting: *Deleted

## 2019-10-17 ENCOUNTER — Other Ambulatory Visit: Payer: Self-pay

## 2019-10-17 ENCOUNTER — Ambulatory Visit
Admission: RE | Admit: 2019-10-17 | Discharge: 2019-10-17 | Disposition: A | Discharge status: Home / Self Care | Payer: BC Managed Care – PPO | Source: Ambulatory Visit | Attending: General Surgery | Admitting: General Surgery

## 2019-10-17 DIAGNOSIS — C50511 Malignant neoplasm of lower-outer quadrant of right female breast: Secondary | ICD-10-CM

## 2019-10-17 HISTORY — DX: Personal history of irradiation: Z92.3

## 2019-10-19 ENCOUNTER — Ambulatory Visit: Payer: BC Managed Care – PPO | Attending: Radiation Oncology | Admitting: Physical Therapy

## 2019-10-19 ENCOUNTER — Other Ambulatory Visit: Payer: Self-pay

## 2019-10-19 ENCOUNTER — Encounter: Payer: Self-pay | Admitting: Physical Therapy

## 2019-10-19 ENCOUNTER — Encounter: Payer: Self-pay | Admitting: Hematology and Oncology

## 2019-10-19 DIAGNOSIS — I890 Lymphedema, not elsewhere classified: Secondary | ICD-10-CM

## 2019-10-19 DIAGNOSIS — Z483 Aftercare following surgery for neoplasm: Secondary | ICD-10-CM | POA: Insufficient documentation

## 2019-10-19 NOTE — Therapy (Signed)
[place], [place], [address] Phone: [phone]   Fax:  [phone]  Physical Therapy Treatment  Patient Details  Name: Holly Jenkins MRN: [id number] Date of Birth: April 22, 1976 Referring Provider (PT): Dr. Isidore Moos  (also Dr. [name] and Dr. Lindi Adie)   Encounter Date: 10/19/2019  PT End of Session - 10/19/19 0957    Visit Number  15    Number of Visits  17    Date for PT Re-Evaluation  10/16/19    PT Start Time  0912   pt arrived late   PT Stop Time  0957    PT Time Calculation (min)  45 min    Activity Tolerance  Patient tolerated treatment well    Behavior During Therapy  [facility] for tasks assessed/performed       Past Medical History:  Diagnosis Date  . Anxiety   . Breast cancer ([place]) 2020  . Fibromyalgia   . 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 Migraines   . Mold exposure    toxicity  . Personal history of radiation therapy     Past Surgical History:  Procedure Laterality Date  . BREAST LUMPECTOMY Right 05/24/2019  . BREAST LUMPECTOMY WITH RADIOACTIVE SEED AND SENTINEL LYMPH NODE BIOPSY Right 05/24/2019   Procedure: RIGHT BREAST LUMPECTOMY WITH RADIOACTIVE SEED AND RIGHT AXILLARY DEEP SENTINEL LYMPH NODE BIOPSY, INJECT BLUE DYE;  Surgeon: Fanny Skates, MD;  Location: [place];  Service: General;  Laterality: Right;  . CERVICAL SPINE SURGERY     c5-c6-c7 fusion with disc transplants    There were no vitals filed for this visit.  Subjective Assessment - 10/19/19 0913    Subjective  I went to the surgeons PA and she tried to drain it and not much came out. I tried to wear my compression bra at work all day and it was super uncomfortable. It was hot and swollen. I had a mammogram and ultrasound and they said everything was perfect. There was no fluid, no cancer, no capsule. The compression shirt feels so good.    Pertinent History  right  breast cancer diagnosed on 04/25/2019 with right lumpectomy on 05/24/2019 with 0/4 nodes removed followed by radiation from 9/18- 07/26/2019 she will have taxoifen but no other chemo    Patient Stated Goals  to get rid of the pain and swelling in her armpit    Currently in Pain?  Yes    Pain Score  2     Pain Location  Breast    Pain Orientation  Right;Lateral    Pain Descriptors / Indicators  Aching    Pain Type  Chronic pain    Pain Onset  More than a month ago               Quick Dash - 10/19/19 0001    Open a tight or new jar  Mild difficulty    Do heavy household chores (wash walls, wash floors)  Mild difficulty    Carry a shopping bag or briefcase  No difficulty    Wash your back  No difficulty    Use a knife to cut food  No difficulty    Recreational activities in which you take some force or impact through your arm, shoulder, or hand (golf, hammering, tennis)  Mild difficulty    During the past week, to what extent has your arm, shoulder or hand problem interfered with your normal  social activities with family, friends, neighbors, or groups?  Not at all    During the past week, to what extent has your arm, shoulder or hand problem limited your work or other regular daily activities  Slightly    Arm, shoulder, or hand pain.  Mild    Tingling (pins and needles) in your arm, shoulder, or hand  None    Difficulty Sleeping  Mild difficulty    DASH Score  13.64 %             [facility] Adult PT Treatment/Exercise - 10/19/19 0001      Manual Therapy   Soft tissue mobilization  gently to scar just inferior to areola    Manual Lymphatic Drainage (MLD)  in supine: short neck, 5 diaphragmatic breaths, right inguinal nodes and establishment of axillo inguinal pathway, left axillary nodes and establishment of inter axillary pathway, right breast moving fluid towards pathways then retracing all steps   no areas of fibrosis palpable today                 PT Long Term  Goals - 10/19/19 0917      PT LONG TERM GOAL #1   Title  Pt wil report the fullness in her right axilla is dereased by 75%    Baseline  50% improvement at this time-09/11/19, 09/18/19- 70% improvement, 10/19/19- 80%    Time  4    Period  Weeks    Status  Achieved      PT LONG TERM GOAL #2   Title  Pt will be independent in self manual lymph drainage and use of compression to manage fullness in right axilla and breast    Baseline  09/18/19- pt is independent in this    Period  Weeks    Status  Achieved      PT LONG TERM GOAL #3   Title  Pt will be independent in a gradually progressive exercise program    Baseline  Pt reports independence with her own strenthening exercise program, has been instructed in Rt lateral trunk stretches and today new axillary stretches-09/11/19    Time  4    Period  Weeks    Status  Achieved      PT LONG TERM GOAL #4   Title  Pt will decrease Quick DASH score to < 7 indicating an improvment in function of left arm    Baseline  15.91, 09/18/19- 18.18, 10/19/19- 13.64    Time  4    Period  Weeks    Status  Not Met      PT LONG TERM GOAL #5   Title  Pt will be able to sleep on right without increased pain, discomfort and tightnening.    Baseline  10/19/19- still is unable to sleep on that side but can lay on that side for 5 min which is an improvement    Time  4    Period  Weeks    Status  Partially Met            Plan - 10/19/19 0957    Clinical Impression Statement  Pt saw the PA and they were unable to drain much fluid from her right breast. She then had a mammogram and ultrasound both which showed no fluid accumulation, seroma or cancer. Pt is unable to tolerate the compression bra due to an increase in swelling when she wears it for a long time but does benefit from wearing her compression shirt. At  this time she feels she is able to independently manage this at home with use of self MLD and compression shirt. Today there is no palpable fibrotic areas.  Pt to be discharged from skilled PT services at this time.    PT Frequency  2x / week    PT Duration  4 weeks    PT Treatment/Interventions  ADLs/Self Care Home Management;Therapeutic activities;Therapeutic exercise;Neuromuscular re-education;Patient/family education;Orthotic Fit/Training;Manual techniques;Manual lymph drainage;Compression bandaging;Scar mobilization;Passive range of motion;Taping    PT Next Visit Plan  d/c this visit    PT Home Exercise Plan  Access Code: [email or web address], supine scap ex    Consulted and Agree with Plan of Care  Patient       Patient will benefit from skilled therapeutic intervention in order to improve the following deficits and impairments:  Increased edema, Impaired perceived functional ability, Decreased scar mobility, Decreased knowledge of precautions, Decreased knowledge of use of DME, Increased fascial restricitons, Impaired UE functional use, Postural dysfunction, Pain  Visit Diagnosis: Lymphedema, not elsewhere classified  Aftercare following surgery for neoplasm     Problem List Patient Active Problem List   Diagnosis Date Noted  . Genetic testing 05/10/2019  . Malignant neoplasm of lower-outer quadrant of right breast of female, estrogen receptor positive ([place]) 04/25/2019  . Chronic migraine w/o aura w/o status migrainosus, not intractable 05/26/2016  . Cervical dystonia 05/26/2016    [facility] 10/19/2019, 10:00 AM  [place], [place], [address] Phone: [phone]   Fax:  [phone]  Name: Holly Jenkins MRN: [id number] Date of Birth: 06-Apr-1976  PHYSICAL THERAPY DISCHARGE SUMMARY  Visits from Start of Care: 15  Current functional level related to goals / functional outcomes: Most goals met, see above    Remaining deficits: Pt not able to sleep on right side but is able to tolerate lying on R side long with less discomfort.   Education  / Equipment: HEP, self MLD, compression garments  Plan: Patient agrees to discharge.  Patient goals were partially met. Patient is being discharged due to meeting the stated rehab goals.  ?????     [facility] [place], [place] 10/19/19 10:01 AM

## 2019-10-20 ENCOUNTER — Encounter: Payer: Self-pay | Admitting: Adult Health

## 2019-11-03 ENCOUNTER — Encounter: Payer: BC Managed Care – PPO | Admitting: Adult Health

## 2019-11-16 ENCOUNTER — Telehealth: Payer: Self-pay | Admitting: Neurology

## 2019-11-20 MED ORDER — CAMBIA 50 MG PO PACK: PACK | ORAL | 5 refills | Status: DC

## 2019-11-20 NOTE — Telephone Encounter (Signed)
Patient wants to check the status of refill request for [place]. Please follow up.

## 2019-11-20 NOTE — Telephone Encounter (Signed)
Cambia resent to [employer] .

## 2019-11-20 NOTE — Addendum Note (Signed)
Addended by: Oliver Hum S on: 11/20/2019 11:05 AM   Modules accepted: Orders

## 2019-11-28 ENCOUNTER — Ambulatory Visit: Payer: BC Managed Care – PPO | Admitting: Family Medicine

## 2019-11-30 ENCOUNTER — Encounter: Payer: Self-pay | Admitting: Adult Health

## 2019-11-30 ENCOUNTER — Inpatient Hospital Stay: Payer: BC Managed Care – PPO | Attending: Hematology and Oncology | Admitting: Adult Health

## 2019-11-30 DIAGNOSIS — C50511 Malignant neoplasm of lower-outer quadrant of right female breast: Secondary | ICD-10-CM

## 2019-11-30 DIAGNOSIS — Z170 Estrogen receptor positive status [ER+]: Secondary | ICD-10-CM | POA: Diagnosis not present

## 2019-11-30 NOTE — Progress Notes (Signed)
SURVIVORSHIP VIRTUAL VISIT:  I connected with Holly Jenkins on 11/30/19 at  3:30 PM EST by my chart video and verified that I am speaking with the correct person using two identifiers.  I discussed the limitations, risks, security and privacy concerns of performing an evaluation and management service by telephone and the availability of in person appointments. I also discussed with the patient that there may be a patient responsible charge related to this service. The patient expressed understanding and agreed to proceed.   BRIEF ONCOLOGIC HISTORY:  Oncology History  Malignant neoplasm of lower-outer quadrant of right breast of female, estrogen receptor positive ([place])  04/25/2019 Initial Diagnosis   Right breast mass identified on clinical examination. Mammogram showed 1.6cm indeterminate mass in the lower outer right breast, no abnormal axillary lymph nodes. 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   05/03/2019 Cancer Staging   Staging form: Breast, AJCC 8th Edition - Clinical stage from 05/03/2019: Stage IA (cT1c, cN0, cM0, G2, ER+, PR+, HER2-) - Signed by Nicholas Lose, MD on 05/03/2019   05/03/2019 Genetic Testing   4 Variants of Uncertain Significance: VUS in BMPR1A called c.565T>C, VUS in NBN called c.724>A, VUS in NTHL1 called c.704G>A, and VUS in POLE c.1847G>A identified on the [facility] Breast Cancer STAT Panel  + Common Hereditary Cancers Panel. The STAT Breast cancer panel offered by [facility] includes sequencing and rearrangement analysis for the following 9 genes:  ATM, BRCA1, BRCA2, CDH1, CHEK2, PALB2, PTEN, STK11 and TP53.   The Common Hereditary Cancers Panel offered by [facility] includes sequencing and/or deletion duplication testing of the following 47 genes: APC, ATM, AXIN2, BARD1, BMPR1A, BRCA1, BRCA2, BRIP1, CDH1, CDKN2A (p14ARF), CDKN2A (p16INK4a), CKD4, CHEK2, CTNNA1, DICER1, EPCAM (Deletion/duplication testing only), GREM1 (promoter region  deletion/duplication testing only), KIT, MEN1, MLH1, MSH2, MSH3, MSH6, MUTYH, NBN, NF1, NHTL1, PALB2, PDGFRA, PMS2, POLD1, POLE, PTEN, RAD50, RAD51C, RAD51D, SDHB, SDHC, SDHD, SMAD4, SMARCA4. STK11, TP53, TSC1, TSC2, and VHL.  The following genes were evaluated for sequence changes only: SDHA and HOXB13 c.251G>A variant only. The report date is 05/09/2019.   05/24/2019 Surgery   Right lumpectomy [name]): IDC, grade 1, 1.1cm, with LCIS and high grade DCIS, clear margins, and no evidence of malignancy in 4 lymph nodes.    05/24/2019 Cancer Staging   Staging form: Breast, AJCC 8th Edition - Pathologic stage from 05/24/2019: Stage IA (pT1c, pN0, cM0, G1, ER+, PR+, HER2-) - Signed by Gardenia Phlegm, NP on 06/07/2019   05/24/2019 Oncotype testing   The Oncotype DX score was 18 predicting a risk of outside the breast recurrence over the next 9 years of 5% if the patient's only systemic therapy is anti-estrogen therapy for 5 years.     06/29/2019 - 07/26/2019 Radiation Therapy   The patient initially received a dose of 40.05 Gy in 15 fractions to the breast using whole-breast tangent fields. This was delivered using a 3-D conformal technique. The pt received a boost delivering an additional 10 Gy in 5 fractions using a electron boost with 70mV electrons. The total dose was 50.05 Gy.   07/28/2019 - 10/12/2019 Anti-estrogen oral therapy   Tamoxifen '20mg'$  daily, plan for 10 years; unable to tolerate.  Had significant side effects.     INTERVAL HISTORY:  Holly Jenkins review her survivorship care plan detailing her treatment course for breast cancer, as well as monitoring long-term side effects of that treatment, education regarding health maintenance, screening, and overall wellness and health promotion.  Overall, Holly Jenkins reports feeling quite well.  She had some swelling in her right breast and has been doing PT and massage and it is improved.  She was only able to take tamoxifen briefly.   She wasn't metabolizing the treatment properly. She notes the extreme hyperactivity/anxiety, vaginal dryness and bleeding, hair shedding, significant hot flashes.    REVIEW OF SYSTEMS:  Review of Systems  Constitutional: Negative for appetite change, chills, fatigue, fever and unexpected weight change.  HENT:   Negative for hearing loss, lump/mass and trouble swallowing.   Eyes: Negative for eye problems and icterus.  Respiratory: Negative for chest tightness, cough and shortness of breath.   Cardiovascular: Negative for chest pain, leg swelling and palpitations.  Gastrointestinal: Negative for abdominal distention, abdominal pain, constipation, diarrhea, nausea and vomiting.  Endocrine: Negative for hot flashes.  Genitourinary: Negative for difficulty urinating.   Musculoskeletal: Negative for arthralgias.  Skin: Negative for itching and rash.  Neurological: Negative for dizziness, extremity weakness, headaches and numbness.  Hematological: Negative for adenopathy. Does not bruise/bleed easily.  Psychiatric/Behavioral: Negative for depression. The patient is not nervous/anxious.    Breast: Denies any new nodularity, masses, tenderness, nipple changes, or nipple discharge.      ONCOLOGY TREATMENT TEAM:  1. Surgeon:  Dr. Donne Hazel at [facility] 2. Medical Oncologist: Dr. Lindi Adie  3. Radiation Oncologist: Dr. Lisbeth Renshaw    PAST MEDICAL/SURGICAL HISTORY:  Past Medical History:  Diagnosis Date  . Anxiety   . Breast cancer ([place]) 2020  . Fibromyalgia   . 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 Migraines   . Mold exposure    toxicity  . Personal history of radiation therapy    Past Surgical History:  Procedure Laterality Date  . BREAST LUMPECTOMY Right 05/24/2019  . BREAST LUMPECTOMY WITH RADIOACTIVE SEED AND SENTINEL LYMPH NODE BIOPSY Right 05/24/2019   Procedure: RIGHT BREAST LUMPECTOMY WITH RADIOACTIVE SEED AND RIGHT AXILLARY DEEP SENTINEL  LYMPH NODE BIOPSY, INJECT BLUE DYE;  Surgeon: Fanny Skates, MD;  Location: [place];  Service: General;  Laterality: Right;  . CERVICAL SPINE SURGERY     c5-c6-c7 fusion with disc transplants     ALLERGIES:  Allergies  Allergen Reactions  . Dust Mite Extract   . Eggs Or Egg-Derived Products Nausea And Vomiting    Egg whites cause vomiting  . Gluten Meal Other (See Comments)  . Thimerosal Itching    Eyes bright red, itchy and swollen, flu like symptoms  . Peanut-Containing Drug Products Rash    Rash, wheezing  . Sulfites Rash     CURRENT MEDICATIONS:  Outpatient Encounter Medications as of 11/30/2019  Medication Sig Note  . AJOVY 225 MG/1.5ML SOSY INJECT 1 syringe SUBCUTANEOUSLY EVERY 30 DAYS   . Ascorbic Acid (VITA-C PO) Take 1 tablet by mouth at bedtime.    Marland Kitchen BLACK CURRANT SEED OIL PO Take 1 tablet by mouth 2 (two) times daily.   . botulinum toxin Type A (BOTOX) 100 units SOLR injection Inject IM every 3 months into head and neck muscles by provider in the office 02/02/2019: Was given 11/11/18 sy  . clonazePAM (KLONOPIN) 0.5 MG tablet Take 0.5 mg by mouth 2 (two) times daily as needed for anxiety.   . Diclofenac Potassium,Migraine, (CAMBIA) 50 MG PACK MIX 1 PACKET WITH 1-2 OUNCES OF WATER. DRINK IMMEDIATELY AS A SINGLE DOSE.   . Melatonin-Pyridoxine (MELATIN PO) Take 1 tablet by mouth daily.   . Menaquinone-7 (VITAMIN  K2 PO) Take 1 tablet by mouth daily.   . Multiple Vitamins-Minerals (ZINC PO) Take 1 tablet by mouth 2 (two) times daily before a meal.    . NON FORMULARY D-HIST  2 TABS PO QD   . NON FORMULARY Take 1 tablet by mouth. Methylation Complete 1 tab daily in the afternoon under the tongue   . Omega-3 Fatty Acids (OMEGA 3 PO) Take 1 tablet by mouth daily.   . ondansetron (ZOFRAN) 4 MG tablet Take acutely at onset of migraine or nausea. As needed.Maximum 4x a day   . OVER THE COUNTER MEDICATION Take 1 tablet by mouth daily. Vitamin D3   . OVER THE  COUNTER MEDICATION Take 1 tablet by mouth. Iron one tablet daily in the afternoon   . propranolol (INDERAL) 10 MG tablet Take acutely for migraine   . Testosterone Propionate (FIRST-TESTOSTERONE MC) 2 % CREA Place 0.25 g onto the skin daily.   . Thyroid (NATURE-THROID PO) Take by mouth daily.   . TURMERIC PO Take by mouth daily.   Marland Kitchen VITAMIN B COMPLEX-C PO Take 1 tablet by mouth daily.    No facility-administered encounter medications on file as of 11/30/2019.     ONCOLOGIC FAMILY HISTORY:  Family History  Problem Relation Age of Onset  . Healthy Mother   . Diabetes Maternal Grandmother      GENETIC COUNSELING/TESTING: See above  SOCIAL HISTORY:  Social History   Socioeconomic History  . Marital status: Married    Spouse name: Not on file  . Number of children: Not on file  . Years of education: Not on file  . Highest education level: Not on file  Occupational History  . Not on file  Tobacco Use  . Smoking status: Never Smoker  . Smokeless tobacco: Never Used  Substance and Sexual Activity  . Alcohol use: No  . Drug use: No  . Sexual activity: Yes    Birth control/protection: Sponge  Other Topics Concern  . Not on file  Social History Narrative   Lives at home w/ her husband   Social Determinants of Health   Financial Resource Strain:   . Difficulty of Paying Living Expenses: Not on file  Food Insecurity:   . Worried About Charity fundraiser in the Last Year: Not on file  . Ran Out of Food in the Last Year: Not on file  Transportation Needs: No Transportation Needs  . Lack of Transportation (Medical): No  . Lack of Transportation (Non-Medical): No  Physical Activity:   . Days of Exercise per Week: Not on file  . Minutes of Exercise per Session: Not on file  Stress:   . Feeling of Stress : Not on file  Social Connections:   . Frequency of Communication with Friends and Family: Not on file  . Frequency of Social Gatherings with Friends and Family: Not on  file  . Attends Religious Services: Not on file  . Active Member of Clubs or Organizations: Not on file  . Attends Archivist Meetings: Not on file  . Marital Status: Not on file  Intimate Partner Violence: Not At Risk  . Fear of Current or Ex-Partner: No  . Emotionally Abused: No  . Physically Abused: No  . Sexually Abused: No     OBSERVATIONS/OBJECTIVE:  Patient appears well.  In no apparent distress.  Mood and behavior are normal.  Breathing is non labored.  Skin visualized is without rash or lesion.    LABORATORY DATA:  None for this visit.  DIAGNOSTIC IMAGING:  None for this visit.      ASSESSMENT AND PLAN:  Holly Jenkins is a pleasant 44 y.o. female with Stage IA right breast invasive ductal carcinoma, ER+/PR+/HER2-, diagnosed in 04/2019, treated with lumpectomy, adjuvant radiation therapy, and anti-estrogen therapy with Tamoxifen beginning in 07/2019, but unable to tolerate.  She presents to the Survivorship Clinic for our initial meeting and routine follow-up post-completion of treatment for breast cancer.    1. Stage IA right breast cancer:  Holly Jenkins is continuing to recover from definitive treatment for breast cancer. She will follow-up with her medical oncologist, Dr. Lindi Adie in 03/2020 with history and physical exam per surveillance protocol.   Her mammogram is due 04/2020; orders placed today.  Her breast density is category D.  I placed orders on a breast  Today, a comprehensive survivorship care plan and treatment summary was reviewed with the patient today detailing her breast cancer diagnosis, treatment course, potential late/long-term effects of treatment, appropriate follow-up care with recommendations for the future, and patient education resources.  A copy of this summary, along with a letter will be sent to the patient's primary care provider via mail/fax/In Basket message after today's visit.    2. Bone health:   She was given education on specific  activities to promote bone health.  3. Cancer screening:  Due to Holly Jenkins's history and her age, she should receive screening for skin cancers, colon cancer, and gynecologic cancers.  The information and recommendations are listed on the patient's comprehensive care plan/treatment summary and were reviewed in detail with the patient.    4. Health maintenance and wellness promotion: Holly Jenkins was encouraged to consume 5-7 servings of fruits and vegetables per day. We reviewed the "Nutrition Rainbow" handout, as well as the handout "Take Control of Your Health and Reduce Your Cancer Risk" from the [employer] [facility].  She was also encouraged to engage in moderate to vigorous exercise for 30 minutes per day most days of the week. We discussed the [employer] fitness program, which is designed for cancer survivors to help them become more physically fit after cancer treatments.  She was instructed to limit her alcohol consumption and continue to abstain from tobacco use.     5. Support services/counseling: It is not uncommon for this period of the patient's cancer care trajectory to be one of many emotions and stressors.  We discussed how this can be increasingly difficult during the times of quarantine and social distancing due to the COVID-19 pandemic.   She was given information regarding our available services and encouraged to contact me with any questions or for help enrolling in any of our support group/programs.    Follow up instructions:    -Return to cancer center in 03/2020 for f/u with Dr. Lindi Adie  -Mammogram due in 04/2020 -Consideration of breast MRI due to breast density due in 08/2020 -F/u with Dr. Donne Hazel 09/2020 -She is welcome to return back to the Survivorship Clinic at any time; no additional follow-up needed at this time.  -Consider referral back to survivorship as a long-term survivor for continued surveillance  The patient was provided an opportunity to ask  questions and all were answered. The patient agreed with the plan and demonstrated an understanding of the instructions.   The patient was advised to call back or seek an in-person evaluation if the symptoms worsen or if the condition fails to improve as anticipated.   Total encounter time: 30  minutes*  Wilber Bihari, NP 11/30/19 3:57 PM Medical Oncology and Hematology [facility] [place], [place] [address] Tel. [phone]    Fax. [phone]  *Total Encounter Time as defined by the Centers for Medicare and Medicaid Services includes, in addition to the face-to-face time of a patient visit (documented in the note above) non-face-to-face time: obtaining and reviewing outside history, ordering and reviewing medications, tests or procedures, care coordination (communications with other health care professionals or caregivers) and documentation in the medical record.

## 2019-12-04 ENCOUNTER — Telehealth: Payer: Self-pay | Admitting: Family Medicine

## 2019-12-04 NOTE — Telephone Encounter (Signed)
Pt called wanting to schedule her BOTOX appt. Please advise. °

## 2019-12-06 ENCOUNTER — Other Ambulatory Visit: Payer: Self-pay | Admitting: Neurology

## 2019-12-07 NOTE — Telephone Encounter (Signed)
I spoke with the patient today and scheduled her botox apt. DWD

## 2019-12-11 ENCOUNTER — Telehealth: Payer: Self-pay | Admitting: Family Medicine

## 2019-12-11 ENCOUNTER — Encounter: Payer: Self-pay | Admitting: Family Medicine

## 2019-12-11 ENCOUNTER — Ambulatory Visit (INDEPENDENT_AMBULATORY_CARE_PROVIDER_SITE_OTHER): Payer: BC Managed Care – PPO | Admitting: Family Medicine

## 2019-12-11 DIAGNOSIS — G43709 Chronic migraine without aura, not intractable, without status migrainosus: Secondary | ICD-10-CM

## 2019-12-11 NOTE — Telephone Encounter (Signed)
I called pt and she was able to use copay card with [facility] the  First time $10 copay.  When running thru insurance shows needs PA but when thru copay card $10 copay.  She needs to call them for delivery.  Nothing on our end at this time.  I relayed to pt and she will call them.

## 2019-12-11 NOTE — Progress Notes (Addendum)
She continues to do well with Botox therapy. She reports > 50% reduction in migraines. Having about 6-8 migraines per month. She continues to pay cash for Botox as insurance will not cover while on Amovig. She has tried only one of the two therapies in the past with significant worsening of headaches. She responds best to CGRP with Botox therapy. She is using Cambia as needed for abortive therapy (about 6 times monthly) and this works very well.     Consent Form Botulism Toxin Injection For Chronic Migraine    Reviewed orally with patient, additionally signature is on file:  Botulism toxin has been approved by the Federal drug administration for treatment of chronic migraine. Botulism toxin does not cure chronic migraine and it may not be effective in some patients.  The administration of botulism toxin is accomplished by injecting a small amount of toxin into the muscles of the neck and head. Dosage must be titrated for each individual. Any benefits resulting from botulism toxin tend to wear off after 3 months with a repeat injection required if benefit is to be maintained. Injections are usually done every 3-4 months with maximum effect peak achieved by about 2 or 3 weeks. Botulism toxin is expensive and you should be sure of what costs you will incur resulting from the injection.  The side effects of botulism toxin use for chronic migraine may include:   -Transient, and usually mild, facial weakness with facial injections  -Transient, and usually mild, head or neck weakness with head/neck injections  -Reduction or loss of forehead facial animation due to forehead muscle weakness  -Eyelid drooping  -Dry eye  -Pain at the site of injection or bruising at the site of injection  -Double vision  -Potential unknown long term risks   Contraindications: You should not have Botox if you are pregnant, nursing, allergic to albumin, have an infection, skin condition, or muscle weakness at the  site of the injection, or have myasthenia gravis, Lambert-Eaton syndrome, or ALS.  It is also possible that as with any injection, there may be an allergic reaction or no effect from the medication. Reduced effectiveness after repeated injections is sometimes seen and rarely infection at the injection site may occur. All care will be taken to prevent these side effects. If therapy is given over a long time, atrophy and wasting in the muscle injected may occur. Occasionally the patient's become refractory to treatment because they develop antibodies to the toxin. In this event, therapy needs to be modified.  I have read the above information and consent to the administration of botulism toxin.    BOTOX PROCEDURE NOTE FOR MIGRAINE HEADACHE  Contraindications and precautions discussed with patient(above). Aseptic procedure was observed and patient tolerated procedure. Procedure performed by Debbora Presto, FNP-C.   The condition has existed for more than 6 months, and pt does not have a diagnosis of ALS, Myasthenia Gravis or Lambert-Eaton Syndrome.  Risks and benefits of injections discussed and pt agrees to proceed with the procedure.  Written consent obtained  These injections are medically necessary. Pt  receives good benefits from these injections. These injections do not cause sedations or hallucinations which the oral therapies may cause.   Description of procedure:  The patient was placed in a sitting position. The standard protocol was used for Botox as follows, with 5 units of Botox injected at each site:  -Procerus muscle, midline injection  -Corrugator muscle, bilateral injection  -Frontalis muscle, bilateral injection, with 2 sites each  side, medial injection was performed in the upper one third of the frontalis muscle, in the region vertical from the medial inferior edge of the superior orbital rim. The lateral injection was again in the upper one third of the forehead vertically above  the lateral limbus of the cornea, 1.5 cm lateral to the medial injection site.  -Temporalis muscle injection, 4 sites, bilaterally. The first injection was 3 cm above the tragus of the ear, second injection site was 1.5 cm to 3 cm up from the first injection site in line with the tragus of the ear. The third injection site was 1.5-3 cm forward between the first 2 injection sites. The fourth injection site was 1.5 cm posterior to the second injection site. 5th site laterally in the temporalis  muscleat the level of the outer canthus.  -Occipitalis muscle injection, 3 sites, bilaterally. The first injection was done one half way between the occipital protuberance and the tip of the mastoid process behind the ear. The second injection site was done lateral and superior to the first, 1 fingerbreadth from the first injection. The third injection site was 1 fingerbreadth superiorly and medially from the first injection site.  -Cervical paraspinal muscle injection, 2 sites, bilateral knee first injection site was 1 cm from the midline of the cervical spine, 3 cm inferior to the lower border of the occipital protuberance. The second injection site was 1.5 cm superiorly and laterally to the first injection site.  -Trapezius muscle injection was performed at 3 sites, bilaterally. The first injection site was in the upper trapezius muscle halfway between the inflection point of the neck, and the acromion. The second injection site was one half way between the acromion and the first injection site. The third injection was done between the first injection site and the inflection point of the neck.  -LS bilaterally, 5 units each site    Will return for repeat injection in 3 months.   A total of 200 units of Botox was prepared, 165 units of Botox was injected as documented above, any Botox not injected was wasted. The patient tolerated the procedure well, there were no complications of the above procedure.  Made  any corrections needed, and agree with procedure   Sarina Ill, MD [facility]

## 2019-12-11 NOTE — Telephone Encounter (Signed)
I think it is worthwhile to try one more time. She notes significant benefit with both Amovig and Botox. Still having about 6 migraine days per month but baseline was > 20. Let me know what I can do to help!

## 2019-12-11 NOTE — Telephone Encounter (Signed)
Return in about 12 weeks (around 03/04/2020).

## 2019-12-11 NOTE — Progress Notes (Signed)
Botox- 200 units x 4 vials Lot: [id number] Expiration: 07/2022 NDC: [id number]  Bacteriostatic 0.9% Sodium Chloride- 8mL total Lot: [id number] Expiration: 01/11/2020 NDC: [id number]  Dx: [id number] SELF PAY

## 2019-12-11 NOTE — Telephone Encounter (Signed)
Patient reports that [employer] is indicating that insurance needs recent office note to continue refills of cambia. I would like to continue refills for abortive therapy. Can you help provide info needed please Lovey Newcomer? Andee Poles, is there anything I could do from a provider prospective to help with Botox coverage? Have we tried appeal? TY!

## 2019-12-21 NOTE — Telephone Encounter (Signed)
I called Holly Jenkins who was listed on the denial letter to ask about starting an appeal or what the process will be, if we can do a peer to peer. She did not answer so I left a VM asking her to call us back.   Dana: When she calls back please let her know that the patient was denied due to being a CGRP and the provider just wants to double check that there is nothing more we can do, such as a peer to peer or appeal. The patient has received great benefit from being on both medications and she is paying out of pocket.

## 2019-12-21 NOTE — Telephone Encounter (Signed)
I called and scheduled the patient. DWD  

## 2020-01-03 ENCOUNTER — Telehealth: Payer: Self-pay | Admitting: *Deleted

## 2020-01-03 NOTE — Telephone Encounter (Signed)
Ajovy denied. Appeal letter written, placed on NP's desk for signature.

## 2020-01-03 NOTE — Telephone Encounter (Signed)
Ajovy PA, key: [id number], [id number], failed: topamax, maxalt, rizatriptan, propranolol, cambia, lyrica, beta blockers, zaniflex, nortriptyline.  Your information has been submitted to [place]. [employer] [place] will review the request and notify you of the determination decision directly, typically within 72 hours of receiving all information. You will also receive your request decision electronically.  If [employer] [place] has not responded within the specified timeframe or if you have any questions about your PA submission, contact [place] directly at [phone].

## 2020-01-04 NOTE — Telephone Encounter (Signed)
Signed and returned

## 2020-01-04 NOTE — Telephone Encounter (Signed)
Received denial letter from [facility] [place]. FAxed appeal letter and last office note for additional information.

## 2020-01-08 ENCOUNTER — Other Ambulatory Visit: Payer: Self-pay | Admitting: Neurology

## 2020-01-09 ENCOUNTER — Encounter: Payer: Self-pay | Admitting: *Deleted

## 2020-01-09 NOTE — Telephone Encounter (Signed)
Received denial of Ajovy form [employer]. Sent my chart advising patient and advised she get new discount card online.

## 2020-02-20 ENCOUNTER — Telehealth: Payer: Self-pay | Admitting: Family Medicine

## 2020-02-20 NOTE — Telephone Encounter (Signed)
Patient is scheduled for Botox appointment on 6/2. I called [employer] and spoke to [place], who states [phone] and [phone] are valid and billable. P/A required [id number]. I was faxed the PA form. I then called back to inquire if patient is B/B or SP. I spoke to [place] who states patient is eligible for B/B. I received a signature from NP and faxed form back. Awaiting approval.

## 2020-02-26 NOTE — Telephone Encounter (Signed)
Received request for more information regarding patient from [place]. Completed PA form again and added additional medical records to original fax. Faxed back to [phone].

## 2020-02-28 NOTE — Telephone Encounter (Signed)
Let her know that we will have to discontinue Ajovy if she wishes to continue with Botox. I am ok with whatever she decides to do.

## 2020-02-28 NOTE — Telephone Encounter (Signed)
Received PA denial of Botox coverage from [place]. Medication is not approved while patient is on Ajovy. How do we need to proceed for this patient?

## 2020-02-29 NOTE — Telephone Encounter (Signed)
I called patient and advised her of the Botox denial. She states she will continue with the Botox and be self-pay for appointment on 6/2. She states she was self-pay the last time she had Botox and is okay with this if allowed.

## 2020-02-29 NOTE — Telephone Encounter (Signed)
Sounds good. TY!

## 2020-03-06 ENCOUNTER — Other Ambulatory Visit: Payer: Self-pay | Admitting: *Deleted

## 2020-03-06 MED ORDER — AJOVY 225 MG/1.5ML ~~LOC~~ SOSY: PREFILLED_SYRINGE | SUBCUTANEOUS | 2 refills | Status: DC

## 2020-03-13 ENCOUNTER — Ambulatory Visit (INDEPENDENT_AMBULATORY_CARE_PROVIDER_SITE_OTHER): Payer: BC Managed Care – PPO | Admitting: Family Medicine

## 2020-03-13 ENCOUNTER — Other Ambulatory Visit: Payer: Self-pay

## 2020-03-13 DIAGNOSIS — G43709 Chronic migraine without aura, not intractable, without status migrainosus: Secondary | ICD-10-CM | POA: Diagnosis not present

## 2020-03-13 NOTE — Progress Notes (Signed)
 Consent Form Botulism Toxin Injection For Chronic Migraine    Reviewed orally with patient, additionally signature is on file:  Botulism toxin has been approved by the Federal drug administration for treatment of chronic migraine. Botulism toxin does not cure chronic migraine and it may not be effective in some patients.  The administration of botulism toxin is accomplished by injecting a small amount of toxin into the muscles of the neck and head. Dosage must be titrated for each individual. Any benefits resulting from botulism toxin tend to wear off after 3 months with a repeat injection required if benefit is to be maintained. Injections are usually done every 3-4 months with maximum effect peak achieved by about 2 or 3 weeks. Botulism toxin is expensive and you should be sure of what costs you will incur resulting from the injection.  The side effects of botulism toxin use for chronic migraine may include:   -Transient, and usually mild, facial weakness with facial injections  -Transient, and usually mild, head or neck weakness with head/neck injections  -Reduction or loss of forehead facial animation due to forehead muscle weakness  -Eyelid drooping  -Dry eye  -Pain at the site of injection or bruising at the site of injection  -Double vision  -Potential unknown long term risks   Contraindications: You should not have Botox if you are pregnant, nursing, allergic to albumin, have an infection, skin condition, or muscle weakness at the site of the injection, or have myasthenia gravis, Lambert-Eaton syndrome, or ALS.  It is also possible that as with any injection, there may be an allergic reaction or no effect from the medication. Reduced effectiveness after repeated injections is sometimes seen and rarely infection at the injection site may occur. All care will be taken to prevent these side effects. If therapy is given over a long time, atrophy and wasting in the muscle injected may  occur. Occasionally the patient's become refractory to treatment because they develop antibodies to the toxin. In this event, therapy needs to be modified.  I have read the above information and consent to the administration of botulism toxin.    BOTOX PROCEDURE NOTE FOR MIGRAINE HEADACHE  Contraindications and precautions discussed with patient(above). Aseptic procedure was observed and patient tolerated procedure. Procedure performed by Jasey Cortez, FNP-C.   The condition has existed for more than 6 months, and pt does not have a diagnosis of ALS, Myasthenia Gravis or Lambert-Eaton Syndrome.  Risks and benefits of injections discussed and pt agrees to proceed with the procedure.  Written consent obtained  These injections are medically necessary. Pt  receives good benefits from these injections. These injections do not cause sedations or hallucinations which the oral therapies may cause.   Description of procedure:  The patient was placed in a sitting position. The standard protocol was used for Botox as follows, with 5 units of Botox injected at each site:  -Procerus muscle, midline injection  -Corrugator muscle, bilateral injection  -Frontalis muscle, bilateral injection, with 2 sites each side, medial injection was performed in the upper one third of the frontalis muscle, in the region vertical from the medial inferior edge of the superior orbital rim. The lateral injection was again in the upper one third of the forehead vertically above the lateral limbus of the cornea, 1.5 cm lateral to the medial injection site.  -Temporalis muscle injection, 4 sites, bilaterally. The first injection was 3 cm above the tragus of the ear, second injection site was 1.5 cm to   3 cm up from the first injection site in line with the tragus of the ear. The third injection site was 1.5-3 cm forward between the first 2 injection sites. The fourth injection site was 1.5 cm posterior to the second injection  site. 5th site laterally in the temporalis  muscleat the level of the outer canthus.  -Occipitalis muscle injection, 3 sites, bilaterally. The first injection was done one half way between the occipital protuberance and the tip of the mastoid process behind the ear. The second injection site was done lateral and superior to the first, 1 fingerbreadth from the first injection. The third injection site was 1 fingerbreadth superiorly and medially from the first injection site.  -Cervical paraspinal muscle injection, 2 sites, bilaterally. The first injection site was 1 cm from the midline of the cervical spine, 3 cm inferior to the lower border of the occipital protuberance. The second injection site was 1.5 cm superiorly and laterally to the first injection site.  -Trapezius muscle injection was performed at 3 sites, bilaterally. The first injection site was in the upper trapezius muscle halfway between the inflection point of the neck, and the acromion. The second injection site was one half way between the acromion and the first injection site. The third injection was done between the first injection site and the inflection point of the neck.   Will return for repeat injection in 3 months.   A total of 200 units of Botox was prepared, 155 units of Botox was injected as documented above, any Botox not injected was wasted. The patient tolerated the procedure well, there were no complications of the above procedure.   

## 2020-03-13 NOTE — Progress Notes (Signed)
Botox- 200 units x 2 vial Lot: [id number] Expiration: 10/23 NDC: [id number]   Bacteriostatic 0.9% Sodium Chloride- 74mL total [id number] Expiration: 02/22 NDC: [id number]  B/B **

## 2020-03-19 NOTE — Progress Notes (Signed)
Patient Care Team: Marda Stalker, PA-C as PCP - General (Family Medicine) Mauro Kaufmann, RN as Oncology Nurse Navigator Rockwell Germany, RN as Oncology Nurse Navigator Fanny Skates, MD as Consulting Physician (General Surgery) Nicholas Lose, MD as Consulting Physician (Hematology and Oncology) Eppie Gibson, MD as Attending Physician (Radiation Oncology)  DIAGNOSIS:    ICD-10-CM   1. Malignant neoplasm of lower-outer quadrant of right breast of female, estrogen receptor positive ([place])  C50.511    Z17.0     SUMMARY OF ONCOLOGIC HISTORY: Oncology History  Malignant neoplasm of lower-outer quadrant of right breast of female, estrogen receptor positive ([place])  04/25/2019 Initial Diagnosis   Right breast mass identified on clinical examination. Mammogram showed 1.6cm indeterminate mass in the lower outer right breast, no abnormal axillary lymph nodes. 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   05/03/2019 Cancer Staging   Staging form: Breast, AJCC 8th Edition - Clinical stage from 05/03/2019: Stage IA (cT1c, cN0, cM0, G2, ER+, PR+, HER2-) - Signed by Nicholas Lose, MD on 05/03/2019   05/03/2019 Genetic Testing   4 Variants of Uncertain Significance: VUS in BMPR1A called c.565T>C, VUS in NBN called c.724>A, VUS in NTHL1 called c.704G>A, and VUS in POLE c.1847G>A identified on the [facility] Breast Cancer STAT Panel  + Common Hereditary Cancers Panel. The STAT Breast cancer panel offered by [facility] includes sequencing and rearrangement analysis for the following 9 genes:  ATM, BRCA1, BRCA2, CDH1, CHEK2, PALB2, PTEN, STK11 and TP53.   The Common Hereditary Cancers Panel offered by [facility] includes sequencing and/or deletion duplication testing of the following 47 genes: APC, ATM, AXIN2, BARD1, BMPR1A, BRCA1, BRCA2, BRIP1, CDH1, CDKN2A (p14ARF), CDKN2A (p16INK4a), CKD4, CHEK2, CTNNA1, DICER1, EPCAM (Deletion/duplication testing only), GREM1 (promoter region  deletion/duplication testing only), KIT, MEN1, MLH1, MSH2, MSH3, MSH6, MUTYH, NBN, NF1, NHTL1, PALB2, PDGFRA, PMS2, POLD1, POLE, PTEN, RAD50, RAD51C, RAD51D, SDHB, SDHC, SDHD, SMAD4, SMARCA4. STK11, TP53, TSC1, TSC2, and VHL.  The following genes were evaluated for sequence changes only: SDHA and HOXB13 c.251G>A variant only. The report date is 05/09/2019.   05/24/2019 Surgery   Right lumpectomy [name]): IDC, grade 1, 1.1cm, with LCIS and high grade DCIS, clear margins, and no evidence of malignancy in 4 lymph nodes.    05/24/2019 Cancer Staging   Staging form: Breast, AJCC 8th Edition - Pathologic stage from 05/24/2019: Stage IA (pT1c, pN0, cM0, G1, ER+, PR+, HER2-) - Signed by Gardenia Phlegm, NP on 06/07/2019   05/24/2019 Oncotype testing   The Oncotype DX score was 18 predicting a risk of outside the breast recurrence over the next 9 years of 5% if the patient's only systemic therapy is anti-estrogen therapy for 5 years.     06/29/2019 - 07/26/2019 Radiation Therapy   The patient initially received a dose of 40.05 Gy in 15 fractions to the breast using whole-breast tangent fields. This was delivered using a 3-D conformal technique. The pt received a boost delivering an additional 10 Gy in 5 fractions using a electron boost with 16mV electrons. The total dose was 50.05 Gy.   07/28/2019 - 10/12/2019 Anti-estrogen oral therapy   Tamoxifen 261mdaily, plan for 10 years; unable to tolerate.  Had significant side effects.     CHIEF COMPLIANT: Follow-upof right breast cancer  INTERVAL HISTORY: Holly SHAMOONs a [age].o. with above-mentioned history of right breast cancer treated with lumpectomy, radiation, and who is currently on surveillance, as antiestrogen therapy with tamoxifen was  discontinued.Mammogram on 10/17/19 showed no evidence of malignancy in the right breast. She presents to the clinic todayfor follow-up. C/O diff sleeping and breast tenderness and swelling    ALLERGIES:  is allergic to dust mite extract; eggs or egg-derived products; gluten meal; thimerosal; peanut-containing drug products; and sulfites.  MEDICATIONS:  Current Outpatient Medications  Medication Sig Dispense Refill  . Ascorbic Acid (VITA-C PO) Take 1 tablet by mouth at bedtime.     Marland Kitchen BLACK CURRANT SEED OIL PO Take 1 tablet by mouth 2 (two) times daily.    . botulinum toxin Type A (BOTOX) 100 units SOLR injection Inject IM every 3 months into head and neck muscles by provider in the office 2 vial 3  . clonazePAM (KLONOPIN) 0.5 MG tablet Take 0.5 mg by mouth 2 (two) times daily as needed for anxiety.    . Diclofenac Potassium,Migraine, (CAMBIA) 50 MG PACK MIX 1 PACKET WITH 1-2 OUNCES OF WATER. DRINK IMMEDIATELY AS A SINGLE DOSE. 9 each 5  . Fremanezumab-vfrm (AJOVY) 225 MG/1.5ML SOSY INJECT 1 syringe SUBCUTANEOUSLY EVERY 30 DAYS 1.5 mL 2  . Melatonin-Pyridoxine (MELATIN PO) Take 1 tablet by mouth daily.    . Menaquinone-7 (VITAMIN K2 PO) Take 1 tablet by mouth daily.    . Multiple Vitamins-Minerals (ZINC PO) Take 1 tablet by mouth 2 (two) times daily before a meal.     . NON FORMULARY D-HIST  2 TABS PO QD    . NON FORMULARY Take 1 tablet by mouth. Methylation Complete 1 tab daily in the afternoon under the tongue    . Omega-3 Fatty Acids (OMEGA 3 PO) Take 1 tablet by mouth daily.    . ondansetron (ZOFRAN) 4 MG tablet TAKE acutely AT onset of migraine OR nausea AS NEEDED. maximum 4 TIMES A DAY 20 tablet 6  . OVER THE COUNTER MEDICATION Take 1 tablet by mouth daily. Vitamin D3    . OVER THE COUNTER MEDICATION Take 1 tablet by mouth. Iron one tablet daily in the afternoon    . propranolol (INDERAL) 10 MG tablet TAKE 1 TABLET BY MOUTH UP TO 3 TIMES DAILY. TAKE AS NEEDED FOR MIGRAINE. MAY ALSO BE USED FOR ANXIETY PROVOKING SITUATIONS 60 tablet 6  . Testosterone Propionate (FIRST-TESTOSTERONE MC) 2 % CREA Place 0.25 g onto the skin daily.  0  . Thyroid (NATURE-THROID PO) Take by mouth  daily.    . TURMERIC PO Take by mouth daily.    Marland Kitchen VITAMIN B COMPLEX-C PO Take 1 tablet by mouth daily.     No current facility-administered medications for this visit.    PHYSICAL EXAMINATION: ECOG PERFORMANCE STATUS: 1 - Symptomatic but completely ambulatory  Vitals:   03/20/20 1443  BP: 102/64  Pulse: 76  Resp: 18  Temp: 99.1 F (37.3 C)  SpO2: 100%   Filed Weights   03/20/20 1443  Weight: 143 lb 3.2 oz (65 kg)    BREAST: No palpable masses or nodules in either right or left breasts. No palpable axillary supraclavicular or infraclavicular adenopathy no breast tenderness or nipple discharge. (exam performed in the presence of a chaperone)  LABORATORY DATA:  I have reviewed the data as listed CMP Latest Ref Rng & Units 05/03/2019  Glucose 70 - 99 mg/dL 98  BUN 6 - 20 mg/dL 14  Creatinine 0.44 - 1.00 mg/dL 0.73  Sodium 135 - 145 mmol/L 139  Potassium 3.5 - 5.1 mmol/L 4.2  Chloride 98 - 111 mmol/L 105  CO2 22 - 32 mmol/L 24  Calcium 8.9 - 10.3 mg/dL 9.3  Total Protein 6.5 - 8.1 g/dL 7.1  Total Bilirubin 0.3 - 1.2 mg/dL 0.4  Alkaline Phos 38 - 126 U/L 39  AST 15 - 41 U/L 13(L)  ALT 0 - 44 U/L 11    Lab Results  Component Value Date   WBC 7.4 05/03/2019   HGB 12.7 05/03/2019   HCT 38.8 05/03/2019   MCV 87.8 05/03/2019   PLT 256 05/03/2019   NEUTROABS 5.3 05/03/2019    ASSESSMENT & PLAN:  Malignant neoplasm of lower-outer quadrant of right breast of female, estrogen receptor positive ([place]) receptor positive ([place]) 04/25/2019:Right breast mass identified on clinical examination. Mammogram showed 1.6cm indeterminate mass in the lower outer right breast, no abnormal axillary lymph nodes. 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  Recommendations: 1. 05/24/2019 right lumpectomy:invasive ductal carcinoma, grade 1, 1.1cm, with LCIS and high grade DCIS, clear margins, and no evidence of malignancy in 4 lymph nodes. T1CN0 stage Ia  2.  Oncotype DX testing to determine if chemotherapy would be of any benefit followed by 3. Adjuvant radiation therapy9/18/2020-07/28/2019 4. Adjuvant antiestrogen therapywith tamoxifen, could not tolerate it.  Hot flashes mood swings and anxiety, dose reduced to 5 mg daily ---------------------------------------------------------------------------------------------------------------------------- Treatment plan: could not tolerate Tamoxifen discontinued Jan 2021 Breast cancer surveillance: 1. Breast exam: 03/20/2020: Benign 2. Mammogram will be done in Jan. 3.  Surveillance with annual MRIs will be done in July  Return to clinic in 1 year for follow-up    No orders of the defined types were placed in this encounter.  The patient has a good understanding of the overall plan. she agrees with it. she will call with any problems that may develop before the next visit here.  Total time spent: 20 mins including face to face time and time spent for planning, charting and coordination of care  Nicholas Lose, MD 03/20/2020  I, Cloyde Reams Dorshimer, am acting as scribe for Dr. Nicholas Lose.  I have reviewed the above documentation for accuracy and completeness, and I agree with the above.

## 2020-03-20 ENCOUNTER — Other Ambulatory Visit: Payer: Self-pay

## 2020-03-20 ENCOUNTER — Inpatient Hospital Stay: Payer: BC Managed Care – PPO | Attending: Hematology and Oncology | Admitting: Hematology and Oncology

## 2020-03-20 DIAGNOSIS — Z79899 Other long term (current) drug therapy: Secondary | ICD-10-CM | POA: Diagnosis not present

## 2020-03-20 DIAGNOSIS — Z170 Estrogen receptor positive status [ER+]: Secondary | ICD-10-CM | POA: Insufficient documentation

## 2020-03-20 DIAGNOSIS — Z923 Personal history of irradiation: Secondary | ICD-10-CM | POA: Insufficient documentation

## 2020-03-20 DIAGNOSIS — C50511 Malignant neoplasm of lower-outer quadrant of right female breast: Secondary | ICD-10-CM | POA: Diagnosis not present

## 2020-03-20 DIAGNOSIS — Z853 Personal history of malignant neoplasm of breast: Secondary | ICD-10-CM | POA: Insufficient documentation

## 2020-03-20 NOTE — Assessment & Plan Note (Signed)
receptor positive ([place]) 04/25/2019:Right breast mass identified on clinical examination. Mammogram showed 1.6cm indeterminate mass in the lower outer right breast, no abnormal axillary lymph nodes. 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  Recommendations: 1. 05/24/2019 right lumpectomy:invasive ductal carcinoma, grade 1, 1.1cm, with LCIS and high grade DCIS, clear margins, and no evidence of malignancy in 4 lymph nodes. T1CN0 stage Ia  2. Oncotype DX testing to determine if chemotherapy would be of any benefit followed by 3. Adjuvant radiation therapy9/18/2020-07/28/2019 4. Adjuvant antiestrogen therapywith tamoxifen, could not tolerate it.  Hot flashes mood swings and anxiety, dose reduced to 5 mg daily ---------------------------------------------------------------------------------------------------------------------------- Treatment plan: Adjuvant antiestrogen therapy with tamoxifen  She took tamoxifen at 10 mg dose and could not tolerate it.    Reduced to 5 mg daily.  Breast cancer surveillance: 1. Breast exam: 03/20/2020: Benign 2. Mammogram will be done in July 2021. 3.  Surveillance with annual MRIs will be done in January 2022  Return to clinic in 1 year for follow-up

## 2020-03-26 ENCOUNTER — Ambulatory Visit: Payer: BC Managed Care – PPO | Admitting: Hematology and Oncology

## 2020-04-01 ENCOUNTER — Ambulatory Visit
Admission: RE | Admit: 2020-04-01 | Discharge: 2020-04-01 | Disposition: A | Discharge status: Home / Self Care | Payer: BC Managed Care – PPO | Source: Ambulatory Visit | Attending: Hematology and Oncology | Admitting: Hematology and Oncology

## 2020-04-01 ENCOUNTER — Other Ambulatory Visit: Payer: Self-pay

## 2020-04-01 DIAGNOSIS — C50511 Malignant neoplasm of lower-outer quadrant of right female breast: Secondary | ICD-10-CM

## 2020-04-01 MED ORDER — GADOBUTROL 1 MMOL/ML IV SOLN
7.0000 mL | Freq: Once | INTRAVENOUS | Status: AC | PRN
  Administered 2020-04-01: 7 mL via INTRAVENOUS

## 2020-04-09 ENCOUNTER — Other Ambulatory Visit: Payer: Self-pay

## 2020-04-10 ENCOUNTER — Ambulatory Visit (INDEPENDENT_AMBULATORY_CARE_PROVIDER_SITE_OTHER): Payer: BC Managed Care – PPO | Admitting: Nurse Practitioner

## 2020-04-10 ENCOUNTER — Encounter: Payer: Self-pay | Admitting: Nurse Practitioner

## 2020-04-10 VITALS — BP 112/72 | Ht 68.0 in | Wt 143.6 lb

## 2020-04-10 DIAGNOSIS — Z01419 Encounter for gynecological examination (general) (routine) without abnormal findings: Secondary | ICD-10-CM

## 2020-04-10 DIAGNOSIS — N926 Irregular menstruation, unspecified: Secondary | ICD-10-CM

## 2020-04-10 DIAGNOSIS — L700 Acne vulgaris: Secondary | ICD-10-CM

## 2020-04-10 NOTE — Progress Notes (Signed)
   Holly Jenkins [birth year] [id number]   History:  44 y.o. G0 presents for annual exam with complaints of irregular cycles ranging from 3 to 5 weeks at times, light flow. She has also had worsening acne since stopping hormonal contraception for cancer treatment. June 2020 stage IA ductal carcinomaof right breast, ER+ PR+ HER2 negative, lumpectomy 05/2019, radiation. Unable to tolerate Tamoxifen. Has seen neurologist for migraines, stable on medications. Sees functional medicine provider for autoimmune thyroid disease.   Gynecologic History Patient's last menstrual period was 03/18/2020. Period Duration (Days): 3-5 DAYS Period Pattern: (!) Irregular Menstrual Flow: Moderate Menstrual Control: Tampon, Thin pad Menstrual Control Change Freq (Hours): EVERY 4 HOURS Dysmenorrhea: (!) Mild Dysmenorrhea Symptoms: Other (Comment) (SLEEP PROBLEMS) Contraception: vasectomy   Last Pap: 04/04/2018. Results were: normal Last mammogram/MRI: 04/01/2020. Results were: normal, extreme fibroglandular tissue  Past medical history, past surgical history, family history and social history were all reviewed and documented in the EPIC chart.  ROS:  A ROS was performed and pertinent positives and negatives are included.  Exam:  Vitals:   04/10/20 0818  BP: 112/72  Weight: 143 lb 9.6 oz (65.1 kg)  Height: '5\' 8"'$  (1.727 m)   Body mass index is 21.83 kg/m.  General appearance:  Normal Thyroid:  Symmetrical, normal in size, without palpable masses or nodularity. Respiratory  Auscultation:  Clear without wheezing or rhonchi Cardiovascular  Auscultation:  Regular rate, without rubs, murmurs or gallops  Edema/varicosities:  Not grossly evident Abdominal  Soft,nontender, without masses, guarding or rebound.  Liver/spleen:  No organomegaly noted  Hernia:  None appreciated  Skin  Inspection:  Grossly normal   Breasts: Examined lying and sitting.   Right: Without masses, retractions, discharge or  axillary adenopathy.   Left: Without masses, retractions, discharge or axillary adenopathy. Gentitourinary   Inguinal/mons:  Normal without inguinal adenopathy  External genitalia:  Normal  BUS/Urethra/Skene's glands:  Normal  Vagina:  Normal  Cervix:  Normal  Uterus:  Anteverted, normal in size, shape and contour.  Midline and mobile  Adnexa/parametria:     Rt: Without masses or tenderness.   Lt: Without masses or tenderness.  Anus and perineum: Normal  Assessment/Plan:  44 y.o. G0 for annual exam.    Well female exam with routine gynecological exam - Education provided on SBEs, importance of preventative screenings, current guidelines, high calcium diet, regular exercise, and multivitamin daily. Lab work done with functional medicine provider who manages autoimmune thyroid disease.  Irregular menstrual cycle -reviewed menstrual cycle app on phone, short cycle in January but cycles following this were all around 4 weeks. Stopped tamoxifen treatment in November and aware that it may take 4 to 6 months for cycles to regulate.  Acne vulgaris-hormonal contraception in the past helped to control acne but now that she cannot be on this her acne has worsened. She has an appointment with an aesthetician soon and plans to ask for recommendations. If she does not see improvement with their recommendations she will return to office for further management.  Follow-up in 1 year for annual.      Tamela Gammon [facility], 8:24 AM 04/10/2020

## 2020-04-10 NOTE — Patient Instructions (Signed)
Health Maintenance, Female Adopting a healthy lifestyle and getting preventive care are important in promoting health and wellness. Ask your health care provider about:  The right schedule for you to have regular tests and exams.  Things you can do on your own to prevent diseases and keep yourself healthy. What should I know about diet, weight, and exercise? Eat a healthy diet   Eat a diet that includes plenty of vegetables, fruits, low-fat dairy products, and lean protein.  Do not eat a lot of foods that are high in solid fats, added sugars, or sodium. Maintain a healthy weight Body mass index (BMI) is used to identify weight problems. It estimates body fat based on height and weight. Your health care provider can help determine your BMI and help you achieve or maintain a healthy weight. Get regular exercise Get regular exercise. This is one of the most important things you can do for your health. Most adults should:  Exercise for at least 150 minutes each week. The exercise should increase your heart rate and make you sweat (moderate-intensity exercise).  Do strengthening exercises at least twice a week. This is in addition to the moderate-intensity exercise.  Spend less time sitting. Even light physical activity can be beneficial. Watch cholesterol and blood lipids Have your blood tested for lipids and cholesterol at 45 years of age, then have this test every 5 years. Have your cholesterol levels checked more often if:  Your lipid or cholesterol levels are high.  You are older than 45 years of age.  You are at high risk for heart disease. What should I know about cancer screening? Depending on your health history and family history, you may need to have cancer screening at various ages. This may include screening for:  Breast cancer.  Cervical cancer.  Colorectal cancer.  Skin cancer.  Lung cancer. What should I know about heart disease, diabetes, and high blood  pressure? Blood pressure and heart disease  High blood pressure causes heart disease and increases the risk of stroke. This is more likely to develop in people who have high blood pressure readings, are of African descent, or are overweight.  Have your blood pressure checked: ? Every 3-5 years if you are 18-39 years of age. ? Every year if you are 40 years old or older. Diabetes Have regular diabetes screenings. This checks your fasting blood sugar level. Have the screening done:  Once every three years after age 40 if you are at a normal weight and have a low risk for diabetes.  More often and at a younger age if you are overweight or have a high risk for diabetes. What should I know about preventing infection? Hepatitis B If you have a higher risk for hepatitis B, you should be screened for this virus. Talk with your health care provider to find out if you are at risk for hepatitis B infection. Hepatitis C Testing is recommended for:  Everyone born from 1945 through 1965.  Anyone with known risk factors for hepatitis C. Sexually transmitted infections (STIs)  Get screened for STIs, including gonorrhea and chlamydia, if: ? You are sexually active and are younger than 45 years of age. ? You are older than 45 years of age and your health care provider tells you that you are at risk for this type of infection. ? Your sexual activity has changed since you were last screened, and you are at increased risk for chlamydia or gonorrhea. Ask your health care provider if   you are at risk.  Ask your health care provider about whether you are at high risk for HIV. Your health care provider may recommend a prescription medicine to help prevent HIV infection. If you choose to take medicine to prevent HIV, you should first get tested for HIV. You should then be tested every 3 months for as long as you are taking the medicine. Pregnancy  If you are about to stop having your period (premenopausal) and  you may become pregnant, seek counseling before you get pregnant.  Take 400 to 800 micrograms (mcg) of folic acid every day if you become pregnant.  Ask for birth control (contraception) if you want to prevent pregnancy. Osteoporosis and menopause Osteoporosis is a disease in which the bones lose minerals and strength with aging. This can result in bone fractures. If you are 65 years old or older, or if you are at risk for osteoporosis and fractures, ask your health care provider if you should:  Be screened for bone loss.  Take a calcium or vitamin D supplement to lower your risk of fractures.  Be given hormone replacement therapy (HRT) to treat symptoms of menopause. Follow these instructions at home: Lifestyle  Do not use any products that contain nicotine or tobacco, such as cigarettes, e-cigarettes, and chewing tobacco. If you need help quitting, ask your health care provider.  Do not use street drugs.  Do not share needles.  Ask your health care provider for help if you need support or information about quitting drugs. Alcohol use  Do not drink alcohol if: ? Your health care provider tells you not to drink. ? You are pregnant, may be pregnant, or are planning to become pregnant.  If you drink alcohol: ? Limit how much you use to 0-1 drink a day. ? Limit intake if you are breastfeeding.  Be aware of how much alcohol is in your drink. In the [place] one drink equals one 12 oz bottle of beer (355 mL), one 5 oz glass of wine (148 mL), or one 1 oz glass of hard liquor (44 mL). General instructions  Schedule regular health, dental, and eye exams.  Stay current with your vaccines.  Tell your health care provider if: ? You often feel depressed. ? You have ever been abused or do not feel safe at home. Summary  Adopting a healthy lifestyle and getting preventive care are important in promoting health and wellness.  Follow your health care provider's instructions about healthy  diet, exercising, and getting tested or screened for diseases.  Follow your health care provider's instructions on monitoring your cholesterol and blood pressure. This information is not intended to replace advice given to you by your health care provider. Make sure you discuss any questions you have with your health care provider. Document Revised: 09/21/2018 Document Reviewed: 09/21/2018 Elsevier Patient Education  2020 [employer]  

## 2020-05-09 NOTE — Progress Notes (Signed)
PA submitted to [employer] for Ajovy 225mg /1.40ml stringe on Cover my Meds. There is a 24/72hr turn around. Key: [id number] Status: PENDING

## 2020-05-16 NOTE — Progress Notes (Signed)
PA decision came back DENIED  Explanation of determination stated: This medication may be covered when the Aimovig or Emgality 120mg  have been tried and did not work, or both were not tolerated. This medication will not be approved when it is taken with botulinum toxin (BOTOX) or when the member received a botulinum toxin within the last 3 months.   Last BOTOX was 03/13/20

## 2020-05-16 NOTE — Progress Notes (Signed)
She is a cash pay Botox patient. Not sure if that will help with insurance denial with Ajovy. Can you check with Lovey Newcomer or Lovena Le to see if they have had this situation before? TY!

## 2020-05-22 ENCOUNTER — Telehealth: Payer: Self-pay

## 2020-05-22 NOTE — Telephone Encounter (Signed)
PA Appeal decision came back DENIED  Explanation of determination stated: This medication may be covered when the Aimovig or Emgality 120mg  have been tried and did not work, or both were not tolerated. This medication will not be approved when it is taken with botulinum toxin (BOTOX) or when the member received a botulinum toxin within the last 3 months

## 2020-06-12 ENCOUNTER — Ambulatory Visit (INDEPENDENT_AMBULATORY_CARE_PROVIDER_SITE_OTHER): Payer: BC Managed Care – PPO | Admitting: Family Medicine

## 2020-06-12 ENCOUNTER — Encounter: Payer: Self-pay | Admitting: Family Medicine

## 2020-06-12 ENCOUNTER — Other Ambulatory Visit: Payer: Self-pay

## 2020-06-12 VITALS — BP 121/72 | HR 69 | Ht 68.0 in

## 2020-06-12 DIAGNOSIS — G43709 Chronic migraine without aura, not intractable, without status migrainosus: Secondary | ICD-10-CM

## 2020-06-12 MED ORDER — ONDANSETRON HCL 4 MG PO TABS: ORAL_TABLET | ORAL | 6 refills | Status: DC

## 2020-06-12 MED ORDER — CAMBIA 50 MG PO PACK: PACK | ORAL | 5 refills | Status: DC

## 2020-06-12 MED ORDER — PROPRANOLOL HCL 10 MG PO TABS: ORAL_TABLET | ORAL | 3 refills | Status: AC

## 2020-06-12 MED ORDER — AJOVY 225 MG/1.5ML ~~LOC~~ SOSY: PREFILLED_SYRINGE | SUBCUTANEOUS | 3 refills | Status: DC

## 2020-06-12 NOTE — Patient Instructions (Addendum)
We will continue Ajovy and propranolol for prevention and Zofran and Cambia for abortive therapy.    Continue Botox every 12 weeks   Stay well hydrated. Focus on healthy diet and regular exercise.   Follow up for office visit in 1 year   Migraine Headache A migraine headache is a very strong throbbing pain on one side or both sides of your head. This type of headache can also cause other symptoms. It can last from 4 hours to 3 days. Talk with your doctor about what things may bring on (trigger) this condition. What are the causes? The exact cause of this condition is not known. This condition may be triggered or caused by:  Drinking alcohol.  Smoking.  Taking medicines, such as: ? Medicine used to treat chest pain (nitroglycerin). ? Birth control pills. ? Estrogen. ? Some blood pressure medicines.  Eating or drinking certain products.  Doing physical activity. Other things that may trigger a migraine headache include:  Having a menstrual period.  Pregnancy.  Hunger.  Stress.  Not getting enough sleep or getting too much sleep.  Weather changes.  Tiredness (fatigue). What increases the risk?  Being 55-45 years old.  Being female.  Having a family history of migraine headaches.  Being Caucasian.  Having depression or anxiety.  Being very overweight. What are the signs or symptoms?  A throbbing pain. This pain may: ? Happen in any area of the head, such as on one side or both sides. ? Make it hard to do daily activities. ? Get worse with physical activity. ? Get worse around bright lights or loud noises.  Other symptoms may include: ? Feeling sick to your stomach (nauseous). ? Vomiting. ? Dizziness. ? Being sensitive to bright lights, loud noises, or smells.  Before you get a migraine headache, you may get warning signs (an aura). An aura may include: ? Seeing flashing lights or having blind spots. ? Seeing bright spots, halos, or zigzag  lines. ? Having tunnel vision or blurred vision. ? Having numbness or a tingling feeling. ? Having trouble talking. ? Having weak muscles.  Some people have symptoms after a migraine headache (postdromal phase), such as: ? Tiredness. ? Trouble thinking (concentrating). How is this treated?  Taking medicines that: ? Relieve pain. ? Relieve the feeling of being sick to your stomach. ? Prevent migraine headaches.  Treatment may also include: ? Having acupuncture. ? Avoiding foods that bring on migraine headaches. ? Learning ways to control your body functions (biofeedback). ? Therapy to help you know and deal with negative thoughts (cognitive behavioral therapy). Follow these instructions at home: Medicines  Take over-the-counter and prescription medicines only as told by your doctor.  Ask your doctor if the medicine prescribed to you: ? Requires you to avoid driving or using heavy machinery. ? Can cause trouble pooping (constipation). You may need to take these steps to prevent or treat trouble pooping:  Drink enough fluid to keep your pee (urine) pale yellow.  Take over-the-counter or prescription medicines.  Eat foods that are high in fiber. These include beans, whole grains, and fresh fruits and vegetables.  Limit foods that are high in fat and sugar. These include fried or sweet foods. Lifestyle  Do not drink alcohol.  Do not use any products that contain nicotine or tobacco, such as cigarettes, e-cigarettes, and chewing tobacco. If you need help quitting, ask your doctor.  Get at least 8 hours of sleep every night.  Limit and deal with stress.  General instructions      Keep a journal to find out what may bring on your migraine headaches. For example, write down: ? What you eat and drink. ? How much sleep you get. ? Any change in what you eat or drink. ? Any change in your medicines.  If you have a migraine headache: ? Avoid things that make your symptoms  worse, such as bright lights. ? It may help to lie down in a dark, quiet room. ? Do not drive or use heavy machinery. ? Ask your doctor what activities are safe for you.  Keep all follow-up visits as told by your doctor. This is important. Contact a doctor if:  You get a migraine headache that is different or worse than others you have had.  You have more than 15 headache days in one month. Get help right away if:  Your migraine headache gets very bad.  Your migraine headache lasts longer than 72 hours.  You have a fever.  You have a stiff neck.  You have trouble seeing.  Your muscles feel weak or like you cannot control them.  You start to lose your balance a lot.  You start to have trouble walking.  You pass out (faint).  You have a seizure. Summary  A migraine headache is a very strong throbbing pain on one side or both sides of your head. These headaches can also cause other symptoms.  This condition may be treated with medicines and changes to your lifestyle.  Keep a journal to find out what may bring on your migraine headaches.  Contact a doctor if you get a migraine headache that is different or worse than others you have had.  Contact your doctor if you have more than 15 headache days in a month. This information is not intended to replace advice given to you by your health care provider. Make sure you discuss any questions you have with your health care provider. Document Revised: 01/20/2019 Document Reviewed: 11/10/2018 Elsevier Patient Education  [place].

## 2020-06-12 NOTE — Progress Notes (Addendum)
PATIENT: Marina Goodell DOB: 1976-06-30  REASON FOR VISIT: follow up HISTORY FROM: patient  Chief Complaint  Patient presents with  . Follow-up    rm 1  . Migraine    Pt is having no new sx. Pt here for a 45yr f/u.     HISTORY OF PRESENT ILLNESS: Today 06/12/20 TANASHA MENEES is a 44 y.o. female here today for follow up for migraines. She continues Ajovy and propranolol for abortive therapy and uses Cambia and Zofran as needed for abortive therapy. She also continues to pay cash for Botox therapy every 12 weeks. She is very well managed. Baseline migraine days prior to current therapy was 18-20 migraine days. No having 3-6 migraine days. She is tolerating medications well with no obvious adverse effects.   HISTORY: (copied from my note on 02/06/2019)  Sherral Dirocco is a 44 y.o. female for follow up of migraines on Botox therapy.  She reports doing well on Botox therapy.  She initially started therapy about 5 years ago.  Baseline migraines per month were about 18-20 migraine days per month for > a year.  Since starting Botox therapy her migraines have reduced to about 8/month(>50% frequency) and > 50% severity as well.  Her migraines described as unilateral, pulsating/pounding, photo/phonophobia. nausea can be moderately-severe to severe and in the past caused significant impairment to her daily life; they could last 24 hours or longer, no aura, no medication overuse.  After addition of Ajovy he her migraine days have dropped to 1-2 a month.  Unfortunately her insurance does not pay for Ajovy(she is getting Ajovy on the free copay program) and we are trying to get her botox re-approved by insurance due to her great response.  She is doing very well and tolerating medications without any adverse effects.  She is currently weaning Lexapro dose.  Current dose is 5 mg daily.  She has completely weaned off of Lyrica.  She does use propranolol as needed for headaches induced by  anxiety.  She also uses Zofran as needed for nausea and Cambia as needed for migraine abortion.  She is feeling well and without concerns today.   History (copied from Dr Cathren Laine note on 09/14/2016)  Interval history 12 /01/2016: She has restarted botox. She has had chronic migraines for over 20 years will failure of multiple medications. We retried Topamax last month and today she is here wanting to change again. She stopped Topamax after 2 weeks due to side effects,we do nothave a good history she can't remember all themedicationsshe tried so the medications tried are possible,all she knows for sure is thatshe had side effects to b-blockers.  IRS:WNIOEVOJJ Hamiltonis a 44 y.o.femalehere as a referral for migraines. PMHx migraines, cervical dystonia, fibromyalgia. Started 20 years ago, Continuous birth control helped a lot. Trigger is alcohol. Neck pain is a trigger. She has confusion, light sensitivity, nausea, no vomiting, bilateral, tightness, pressure. When severe she needs a dark room and to lay still and cool. No aura. Wakes up with them and more at night as well. When she has one, slowly progresses to 6/10 on average. They usually last 24 hours. The cambia and rizatriptan. She has daily headaches. 8 days a month are migrainous. She was due on botox the 24tj of July. She has neck pain and a diagnosis of cervical dystonia. She has tried muscle relaxers, chiropractic, massage, physical therapy. Very tight progressive muscle pain. Additional botox in the shoulders has significantly helped with the pain and  limited range of motion. Previous to botox in the cervical muscles she had a 5/10 in pain all the time significantly improved. Tizanidine does not help the cervical dystonia that it does help with the migraines. Patient is also on clonazepam which does not help with cervical dystonia. She has also tried Robaxin  Medications tried:Unclear, this is from memory.Topiramate,  nortriptyline, propranolol, lyrica, "a million preventatives", tizanidine, rizatriptan and other triptans she cannot remember, cambia, Zanaflex, Maxalt, trigger point injections in her cervical muscles, massage, Robaxin.  Reviewed notes, labs and imaging from outside physicians, which showed: Patient is being seen at [facility] in [place]. Patient has been receiving Botox injections. Per notes, Botox decrease his frequency and severity significantly, patient headaches and migraines increase when she misses her Botox injections. The headaches are mostly frontotemporal and size of the head. Some right base of her skull and she also has some discomfort in her posterior neck and down her shoulders and she has had spine surgery as well. Multifactorial situation. It appears they injected 250 units of Botox. 155 for Botox migraine protocol and additional 100 for cervical dystonia. They injected the areas where she had most of her pain as well as in her shoulders and neck. She sees a pain specialist as well for her neck pain. She was diagnosed with chronic daily migraine. It appears they also used additional Botox in the front half of her head and also in the nuchal ridge and down her neck. She has had multiple side effects from medications. She is on Zanaflex currently. Patient also is having some radiculopathy into her right arm, the Botox helped in the cervical muscles. She has noticed wearing off effect the 2 weeks prior to her Botox injections.  Reviewed MRI of the brain report. MRI of the brain 05/10/2013. Unremarkable MRI of the brain.   REVIEW OF SYSTEMS: Out of a complete 14 system review of symptoms, the patient complains only of the following symptoms, headaches, acne, and all other reviewed systems are negative.   ALLERGIES: Allergies  Allergen Reactions  . Dust Mite Extract   . Eggs Or Egg-Derived Products Nausea And Vomiting    Egg whites cause vomiting  .  Gluten Meal Other (See Comments)  . Thimerosal Itching    Eyes bright red, itchy and swollen, flu like symptoms  . Peanut-Containing Drug Products Rash    Rash, wheezing  . Sulfites Rash    HOME MEDICATIONS: Outpatient Medications Prior to Visit  Medication Sig Dispense Refill  . Ascorbic Acid (VITA-C PO) Take 1 tablet by mouth at bedtime.     Marland Kitchen BLACK CURRANT SEED OIL PO Take 1 tablet by mouth 2 (two) times daily.    . botulinum toxin Type A (BOTOX) 100 units SOLR injection Inject IM every 3 months into head and neck muscles by provider in the office 2 vial 3  . clonazePAM (KLONOPIN) 0.5 MG tablet Take 0.5 mg by mouth 2 (two) times daily as needed for anxiety.    . Melatonin-Pyridoxine (MELATIN PO) Take 1 tablet by mouth daily.    . Menaquinone-7 (VITAMIN K2 PO) Take 1 tablet by mouth daily.    . Multiple Vitamins-Minerals (ZINC PO) Take 1 tablet by mouth 2 (two) times daily before a meal.     . NON FORMULARY Take 1 tablet by mouth. Methylation Complete 1 tab daily in the afternoon under the tongue     . Omega-3 Fatty Acids (OMEGA 3 PO) Take 1 tablet by mouth  daily.     Marland Kitchen OVER THE COUNTER MEDICATION Take 1 tablet by mouth daily. Vitamin D3    . OVER THE COUNTER MEDICATION Take 1 tablet by mouth. Iron one tablet daily in the afternoon    . Thyroid (NATURE-THROID PO) Take by mouth daily.    . TURMERIC PO Take by mouth daily.    Marland Kitchen VITAMIN B COMPLEX-C PO Take 1 tablet by mouth daily.    . Diclofenac Potassium,Migraine, (CAMBIA) 50 MG PACK MIX 1 PACKET WITH 1-2 OUNCES OF WATER. DRINK IMMEDIATELY AS A SINGLE DOSE. 9 each 5  . Fremanezumab-vfrm (AJOVY) 225 MG/1.5ML SOSY INJECT 1 syringe SUBCUTANEOUSLY EVERY 30 DAYS 1.5 mL 2  . ondansetron (ZOFRAN) 4 MG tablet TAKE acutely AT onset of migraine OR nausea AS NEEDED. maximum 4 TIMES A DAY 20 tablet 6  . propranolol (INDERAL) 10 MG tablet TAKE 1 TABLET BY MOUTH UP TO 3 TIMES DAILY. TAKE AS NEEDED FOR MIGRAINE. MAY ALSO BE USED FOR ANXIETY PROVOKING  SITUATIONS 60 tablet 6  . NON FORMULARY D-HIST  2 TABS PO QD (Patient not taking: Reported on 04/10/2020)    . Testosterone Propionate (FIRST-TESTOSTERONE MC) 2 % CREA Place 0.25 g onto the skin daily.  0   No facility-administered medications prior to visit.    PAST MEDICAL HISTORY: Past Medical History:  Diagnosis Date  . Anxiety   . Breast cancer ([place]) 2020  . Fibromyalgia   . 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 Migraines   . Mold exposure    toxicity  . Personal history of radiation therapy     PAST SURGICAL HISTORY: Past Surgical History:  Procedure Laterality Date  . BREAST LUMPECTOMY Right 05/24/2019  . BREAST LUMPECTOMY WITH RADIOACTIVE SEED AND SENTINEL LYMPH NODE BIOPSY Right 05/24/2019   Procedure: RIGHT BREAST LUMPECTOMY WITH RADIOACTIVE SEED AND RIGHT AXILLARY DEEP SENTINEL LYMPH NODE BIOPSY, INJECT BLUE DYE;  Surgeon: Fanny Skates, MD;  Location: [facility];  Service: General;  Laterality: Right;  . CERVICAL SPINE SURGERY     c5-c6-c7 fusion with disc transplants    FAMILY HISTORY: Family History  Problem Relation Age of Onset  . Healthy Mother   . Diabetes Maternal Grandmother     SOCIAL HISTORY: Social History   Socioeconomic History  . Marital status: Married    Spouse name: Not on file  . Number of children: Not on file  . Years of education: Not on file  . Highest education level: Not on file  Occupational History  . Not on file  Tobacco Use  . Smoking status: Never Smoker  . Smokeless tobacco: Never Used  Vaping Use  . Vaping Use: Never used  Substance and Sexual Activity  . Alcohol use: No  . Drug use: No  . Sexual activity: Yes    Comment: HUSBAND VAS.  Other Topics Concern  . Not on file  Social History Narrative   Lives at home w/ her husband   Social Determinants of Health   Financial Resource Strain:   . Difficulty of Paying Living Expenses: Not on file  Food Insecurity:   .  Worried About Charity fundraiser in the Last Year: Not on file  . Ran Out of Food in the Last Year: Not on file  Transportation Needs: No Transportation Needs  . Lack of Transportation (Medical): No  . Lack of Transportation (Non-Medical): No  Physical Activity:   . Days of Exercise per Week:  Not on file  . Minutes of Exercise per Session: Not on file  Stress:   . Feeling of Stress : Not on file  Social Connections:   . Frequency of Communication with Friends and Family: Not on file  . Frequency of Social Gatherings with Friends and Family: Not on file  . Attends Religious Services: Not on file  . Active Member of Clubs or Organizations: Not on file  . Attends Archivist Meetings: Not on file  . Marital Status: Not on file  Intimate Partner Violence: Not At Risk  . Fear of Current or Ex-Partner: No  . Emotionally Abused: No  . Physically Abused: No  . Sexually Abused: No      PHYSICAL EXAM  Vitals:   06/12/20 0808  Height: 5\' 8"  (1.727 m)   Body mass index is 21.83 kg/m.  Generalized: Well developed, in no acute distress  Cardiology: normal rate and rhythm, no murmur noted Respiratory: clear to auscultation bilaterally  Neurological examination  Mentation: Alert oriented to time, place, history taking. Follows all commands speech and language fluent Cranial nerve II-XII: Pupils were equal round reactive to light. Extraocular movements were full, visual field were full on confrontational test. Facial sensation and strength were normal. Head turning and shoulder shrug  were normal and symmetric. Motor: The motor testing reveals 5 over 5 strength of all 4 extremities. Good symmetric motor tone is noted throughout.  Sensory: Sensory testing is intact to soft touch on all 4 extremities. No evidence of extinction is noted.  Coordination: Cerebellar testing reveals good finger-nose-finger and heel-to-shin bilaterally.  Gait and station: Gait is normal.   DIAGNOSTIC  DATA (LABS, IMAGING, TESTING) - I reviewed patient records, labs, notes, testing and imaging myself where available.  No flowsheet data found.   Lab Results  Component Value Date   WBC 7.4 05/03/2019   HGB 12.7 05/03/2019   HCT 38.8 05/03/2019   MCV 87.8 05/03/2019   PLT 256 05/03/2019      Component Value Date/Time   NA 139 05/03/2019 0839   K 4.2 05/03/2019 0839   CL 105 05/03/2019 0839   CO2 24 05/03/2019 0839   GLUCOSE 98 05/03/2019 0839   BUN 14 05/03/2019 0839   CREATININE 0.73 05/03/2019 0839   CALCIUM 9.3 05/03/2019 0839   PROT 7.1 05/03/2019 0839   ALBUMIN 4.0 05/03/2019 0839   AST 13 (L) 05/03/2019 0839   ALT 11 05/03/2019 0839   ALKPHOS 39 05/03/2019 0839   BILITOT 0.4 05/03/2019 0839   GFRNONAA >60 05/03/2019 0839   GFRAA >60 05/03/2019 0839   No results found for: CHOL, HDL, LDLCALC, LDLDIRECT, TRIG, CHOLHDL No results found for: HGBA1C No results found for: VITAMINB12 No results found for: TSH     ASSESSMENT AND PLAN 44 y.o. year old female  has a past medical history of Anxiety, Breast cancer ([place]) (2020), Fibromyalgia, Increased thyroid stimulating hormone (TSH) level, Migraines, Mold exposure, and Personal history of radiation therapy. here with     ICD-10-CM   1. Chronic migraine w/o aura w/o status migrainosus, not intractable  G43.709     Candid is doing well on current treatment plan. We will continue Ajovy, propranolol, Zofran and Cambia as prescribed. She will continue Botox every 12 weeks. Healthy lifestyle habits encouraged. She will continue close follow up with PCP and [facility] for management of co morbidities. She will follow up every 12 weeks for Botox therapy and annually for office visit. She verbalizes understanding  and agreement with this plan.    No orders of the defined types were placed in this encounter.    Meds ordered this encounter  Medications  . Diclofenac Potassium,Migraine, (CAMBIA) 50 MG PACK    Sig:  MIX 1 PACKET WITH 1-2 OUNCES OF WATER. DRINK IMMEDIATELY AS A SINGLE DOSE.    Dispense:  9 each    Refill:  5    Order Specific Question:   Supervising Provider    Answer:   Melvenia Beam [id number]  . Fremanezumab-vfrm (AJOVY) 225 MG/1.5ML SOSY    Sig: INJECT 1 syringe SUBCUTANEOUSLY EVERY 30 DAYS    Dispense:  4.5 mL    Refill:  3    Order Specific Question:   Supervising Provider    Answer:   Melvenia Beam [id number]  . ondansetron (ZOFRAN) 4 MG tablet    Sig: TAKE acutely AT onset of migraine OR nausea AS NEEDED. maximum 4 TIMES A DAY    Dispense:  20 tablet    Refill:  6    Order Specific Question:   Supervising Provider    Answer:   Melvenia Beam [id number]  . propranolol (INDERAL) 10 MG tablet    Sig: TAKE 1 TABLET BY MOUTH UP TO 3 TIMES DAILY. TAKE AS NEEDED FOR MIGRAINE. MAY ALSO BE USED FOR ANXIETY PROVOKING SITUATIONS    Dispense:  180 tablet    Refill:  3    Order Specific Question:   Supervising Provider    Answer:   Melvenia Beam [id number]      I spent 15 minutes with the patient. 50% of this time was spent counseling and educating patient on plan of care and medications.    Debbora Presto, FNP-C 06/12/2020, 8:09 AM [facility] [address], [place], [place] [address] [phone]  Made any corrections needed, and agree with history, physical, neuro exam,assessment and plan as stated.     Sarina Ill, MD [facility]  Made any corrections needed, and agree with history, physical, neuro exam,assessment and plan as stated.     Sarina Ill, MD [facility]

## 2020-06-18 ENCOUNTER — Ambulatory Visit (INDEPENDENT_AMBULATORY_CARE_PROVIDER_SITE_OTHER): Payer: BC Managed Care – PPO | Admitting: Family Medicine

## 2020-06-18 ENCOUNTER — Other Ambulatory Visit: Payer: Self-pay

## 2020-06-18 ENCOUNTER — Ambulatory Visit: Payer: BC Managed Care – PPO | Admitting: Family Medicine

## 2020-06-18 DIAGNOSIS — G43709 Chronic migraine without aura, not intractable, without status migrainosus: Secondary | ICD-10-CM

## 2020-06-18 NOTE — Progress Notes (Signed)
 Consent Form Botulism Toxin Injection For Chronic Migraine    Reviewed orally with patient, additionally signature is on file:  Botulism toxin has been approved by the Federal drug administration for treatment of chronic migraine. Botulism toxin does not cure chronic migraine and it may not be effective in some patients.  The administration of botulism toxin is accomplished by injecting a small amount of toxin into the muscles of the neck and head. Dosage must be titrated for each individual. Any benefits resulting from botulism toxin tend to wear off after 3 months with a repeat injection required if benefit is to be maintained. Injections are usually done every 3-4 months with maximum effect peak achieved by about 2 or 3 weeks. Botulism toxin is expensive and you should be sure of what costs you will incur resulting from the injection.  The side effects of botulism toxin use for chronic migraine may include:   -Transient, and usually mild, facial weakness with facial injections  -Transient, and usually mild, head or neck weakness with head/neck injections  -Reduction or loss of forehead facial animation due to forehead muscle weakness  -Eyelid drooping  -Dry eye  -Pain at the site of injection or bruising at the site of injection  -Double vision  -Potential unknown long term risks   Contraindications: You should not have Botox if you are pregnant, nursing, allergic to albumin, have an infection, skin condition, or muscle weakness at the site of the injection, or have myasthenia gravis, Lambert-Eaton syndrome, or ALS.  It is also possible that as with any injection, there may be an allergic reaction or no effect from the medication. Reduced effectiveness after repeated injections is sometimes seen and rarely infection at the injection site may occur. All care will be taken to prevent these side effects. If therapy is given over a long time, atrophy and wasting in the muscle injected may  occur. Occasionally the patient's become refractory to treatment because they develop antibodies to the toxin. In this event, therapy needs to be modified.  I have read the above information and consent to the administration of botulism toxin.    BOTOX PROCEDURE NOTE FOR MIGRAINE HEADACHE  Contraindications and precautions discussed with patient(above). Aseptic procedure was observed and patient tolerated procedure. Procedure performed by Ariel Dimitri, FNP-C.   The condition has existed for more than 6 months, and pt does not have a diagnosis of ALS, Myasthenia Gravis or Lambert-Eaton Syndrome.  Risks and benefits of injections discussed and pt agrees to proceed with the procedure.  Written consent obtained  These injections are medically necessary. Pt  receives good benefits from these injections. These injections do not cause sedations or hallucinations which the oral therapies may cause.   Description of procedure:  The patient was placed in a sitting position. The standard protocol was used for Botox as follows, with 5 units of Botox injected at each site:  -Procerus muscle, midline injection  -Corrugator muscle, bilateral injection  -Frontalis muscle, bilateral injection, with 2 sites each side, medial injection was performed in the upper one third of the frontalis muscle, in the region vertical from the medial inferior edge of the superior orbital rim. The lateral injection was again in the upper one third of the forehead vertically above the lateral limbus of the cornea, 1.5 cm lateral to the medial injection site.  -Temporalis muscle injection, 4 sites, bilaterally. The first injection was 3 cm above the tragus of the ear, second injection site was 1.5 cm to   3 cm up from the first injection site in line with the tragus of the ear. The third injection site was 1.5-3 cm forward between the first 2 injection sites. The fourth injection site was 1.5 cm posterior to the second injection  site. 5th site laterally in the temporalis  muscleat the level of the outer canthus.  -Occipitalis muscle injection, 3 sites, bilaterally. The first injection was done one half way between the occipital protuberance and the tip of the mastoid process behind the ear. The second injection site was done lateral and superior to the first, 1 fingerbreadth from the first injection. The third injection site was 1 fingerbreadth superiorly and medially from the first injection site.  -Cervical paraspinal muscle injection, 2 sites, bilaterally. The first injection site was 1 cm from the midline of the cervical spine, 3 cm inferior to the lower border of the occipital protuberance. The second injection site was 1.5 cm superiorly and laterally to the first injection site.  -Trapezius muscle injection was performed at 3 sites, bilaterally. The first injection site was in the upper trapezius muscle halfway between the inflection point of the neck, and the acromion. The second injection site was one half way between the acromion and the first injection site. The third injection was done between the first injection site and the inflection point of the neck.   Will return for repeat injection in 3 months.   A total of 200 units of Botox was prepared, 155 units of Botox was injected as documented above, any Botox not injected was wasted. The patient tolerated the procedure well, there were no complications of the above procedure.   

## 2020-06-18 NOTE — Progress Notes (Signed)
Botox- 100 units x 2 vials Lot: [id number] Expiration: 09/10/2022 NDC: 9412-9047-53  Bacteriostatic 0.9% Sodium Chloride- 58mL total Lot: [id number] Expiration: 02/23 NDC: 7837-5423-70  Dx: C30.172 B/B  Patient signed consent form today for Botox.

## 2020-06-28 ENCOUNTER — Ambulatory Visit: Payer: Self-pay

## 2020-06-28 ENCOUNTER — Ambulatory Visit (INDEPENDENT_AMBULATORY_CARE_PROVIDER_SITE_OTHER): Payer: BC Managed Care – PPO | Admitting: Family Medicine

## 2020-06-28 ENCOUNTER — Other Ambulatory Visit: Payer: Self-pay

## 2020-06-28 ENCOUNTER — Ambulatory Visit (INDEPENDENT_AMBULATORY_CARE_PROVIDER_SITE_OTHER): Payer: BC Managed Care – PPO

## 2020-06-28 VITALS — BP 100/64 | Ht 68.0 in | Wt 144.0 lb

## 2020-06-28 DIAGNOSIS — M7651 Patellar tendinitis, right knee: Secondary | ICD-10-CM | POA: Diagnosis not present

## 2020-06-28 DIAGNOSIS — G8929 Other chronic pain: Secondary | ICD-10-CM

## 2020-06-28 DIAGNOSIS — M25561 Pain in right knee: Secondary | ICD-10-CM | POA: Diagnosis not present

## 2020-06-28 NOTE — Progress Notes (Signed)
Subjective:   I, Holly Jenkins, am serving as a scribe for Dr. Lynne Leader. This visit occurred during the SARS-CoV-2 public health emergency.  Safety protocols were in place, including screening questions prior to the visit, additional usage of staff PPE, and extensive cleaning of exam room while observing appropriate contact time as indicated for disinfecting solutions.   CC: R knee pain  HPI: Pt is a 44 y/o female presenting w/ c/o R knee pain. Patient is active. Does body pump classes, sees a Physiological scientist, figure skates and rollerskates. She locates the pain to anterior lateral and medial knee pain. Patient was in [place] in 2001 and she had to have surgery on right great toe. Pain with squatting or lunging. At its worst she has pain with pressing on gas pedal. Patient has taken a break from activity and she continues to have pain. Patient using Advil and Voltaren, ice and TENS.  She does body pump and spin class.  She also has been starting to learn roller skate figure skating recently which she thinks may be causing her knee pain.   Pertinent review of Systems: No fevers or chills  Relevant historical information: History of breast cancer 2020 stage Ia ER/PR positive.  Status post lumpectomy and x-ray therapy.  Hypothyroidism.  Migraine history.   Objective:    Vitals:   06/28/20 0802  BP: 100/64   General: Well Developed, well nourished, and in no acute distress.   MSK: Bilaterally normal-appearing normal motion nontender hip abduction external rotation strength diminished 4/5 bilaterally.  Right knee normal-appearing normal motion without crepitation. Mildly tender palpation anterior knee patellar tendon. Stable ligamentous exam.   Intact strength. Negative McMurray's test.   Lab and Radiology Results X-ray images right knee obtained today personally and independently reviewed Mild medial compartment degenerative change.  Medial tibial plateau slight irregularity  posterior rim not seen on lateral imaging.  Mild patellofemoral DJD. Await formal radiology review  Diagnostic Limited MSK Ultrasound of: Right knee Quad tendon intact.  Trace hyperechoic change distal tendon insertion consistent with chronic calcific tendinopathy. Trace joint effusion. Patellar tendon intact normal-appearing Moderate hypoechoic fluid tracking deep to distal tendon insertion just superficial to Hoffa's fat pad.  This is associated with some increased vascular activity within Hoffa's fat pad on Doppler ultrasound examination. Lateral joint line normal-appearing Medial joint line slightly narrowed. Posterior knee no significant Baker's cyst Impression: Patellar tendinitis and mild bursitis deep to patellar tendon as well as tiny calcific quad tendinitis    Impression and Recommendations:    Assessment and Plan: 44 y.o. female with right knee pain predominantly due to patellar tendinitis and bursitis.  Plan to treat with Voltaren gel and eccentric exercises with physical therapy.  Check back in 6 weeks.  Normally would be using nitroglycerin patches at this point however given her significant headache history would like to avoid those if possible.  Possibility of considering steroid injection as potential next step however would like to avoid steroids near tendon if possible.   Orders Placed This Encounter  Procedures  . [place] LIMITED JOINT SPACE STRUCTURES LOW RIGHT(NO LINKED CHARGES)    Order Specific Question:   Reason for Exam (SYMPTOM  OR DIAGNOSIS REQUIRED)    Answer:   right knee pain    Order Specific Question:   Preferred imaging location?    Answer:   [facility]  . [place] LIMITED JOINT SPACE STRUCTURES LOW RIGHT(NO LINKED CHARGES)    Order Specific Question:  Reason for Exam (SYMPTOM  OR DIAGNOSIS REQUIRED)    Answer:   right knee pain    Order Specific Question:   Preferred imaging location?    Answer:   [facility]   . DG Knee AP/LAT W/[place] Right    Standing Status:   Future    Standing Expiration Date:   06/28/2021    Order Specific Question:   Reason for Exam (SYMPTOM  OR DIAGNOSIS REQUIRED)    Answer:   eval knee pain    Order Specific Question:   Is patient pregnant?    Answer:   No    Order Specific Question:   Preferred imaging location?    Answer:   [facility]    Order Specific Question:   Radiology Contrast Protocol - do NOT remove file path    Answer:   [email or web address]  . Ambulatory referral to Physical Therapy    Referral Priority:   Routine    Referral Type:   Physical Medicine    Referral Reason:   Specialty Services Required    Requested Specialty:   Physical Therapy    Number of Visits Requested:   1   No orders of the defined types were placed in this encounter.   Discussed warning signs or symptoms. Please see discharge instructions. Patient expresses understanding.   The above documentation has been reviewed and is accurate and complete Lynne Leader, M.D.

## 2020-06-28 NOTE — Patient Instructions (Signed)
Thank you for coming in today.  Please get an Xray today before you leave  Please use voltaren gel up to 4x daily for pain as needed.  I've referred you to Physical Therapy.  Let us know if you don't hear from them in one week.  I recommend you obtained a compression sleeve to help with your joint problems. There are many options on the market however I recommend obtaining a full knee Body Helix compression sleeve.  You can find information (including how to appropriate measure yourself for sizing) can be found at www.Body http://www.lambert.com/.  Many of these products are health savings account (HSA) eligible.   You can use the compression sleeve at any time throughout the day but is most important to use while being active as well as for 2 hours post-activity.   It is appropriate to ice following activity with the compression sleeve in place.  Recheck with me in about 6 weeks.  Let me know if you are not doing well.

## 2020-07-01 NOTE — Progress Notes (Signed)
Knee x-ray looks pretty normal to radiology.

## 2020-07-02 ENCOUNTER — Telehealth: Payer: Self-pay | Admitting: Family Medicine

## 2020-07-02 NOTE — Telephone Encounter (Signed)
error 

## 2020-07-11 IMAGING — MR MR BREAST BILAT WO/W CM
8 of 12 series · 33 of 48 positions shown · IV contrast (gadavist)
Comparison: Previous exam(s).

CLINICAL DATA: History of right breast cancer status post
lumpectomy in Monday May, 2019. Patient's lifetime risk is calculated
to be greater than 20%.

LABS:  None obtained at the time of imaging.
EXAM:
BILATERAL BREAST MRI WITH AND WITHOUT CONTRAST
TECHNIQUE: Multiplanar, multisequence MR images of both breasts were obtained
prior to and following the intravenous administration of 7 ml of
Gadavist

[Series 2: t2_tirm_tra ipat (a-p) · axial · 3.0mm · 0.64mm/px · 1 of 55 slices shown]
[im 1/55]
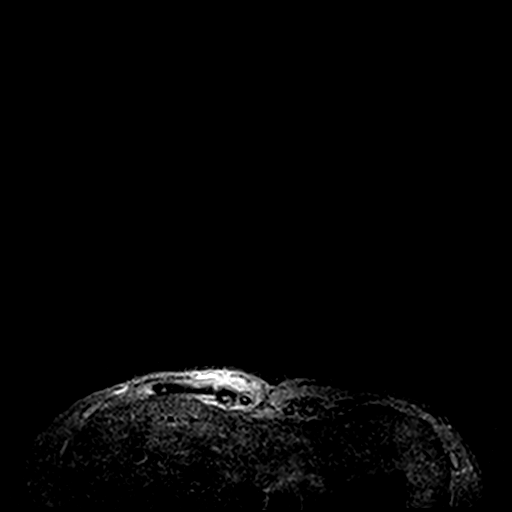

[Series 3: fl3d pre-cm no · axial · non-contrast · 1.2mm · 0.86mm/px · z∈[-71,+100]mm · 5 of 144 slices shown]
[im 1/144]
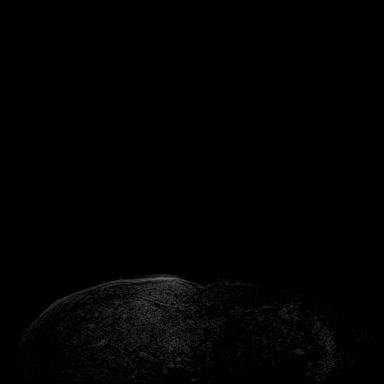
[im 36/144]
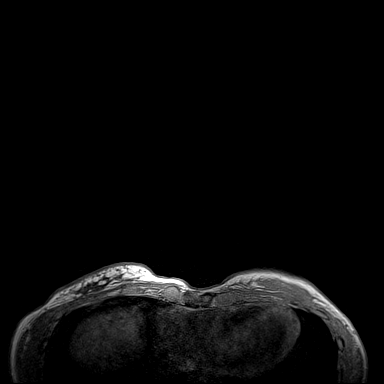
[im 72/144]
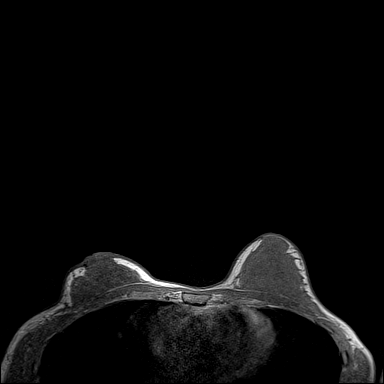
[im 108/144]
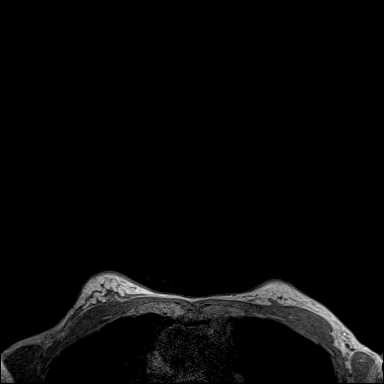
[im 144/144]
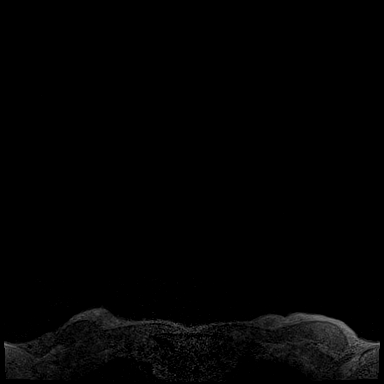

[Series 4: fl3d pre-cm · axial · non-contrast · 1.2mm · 0.86mm/px · z∈[-71,+100]mm · 5 of 144 slices shown]
[im 1/144]
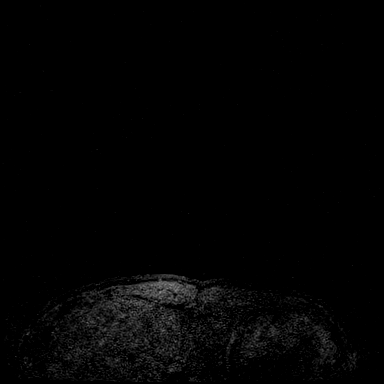
[im 36/144]
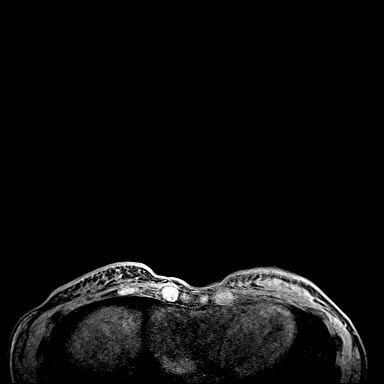
[im 72/144]
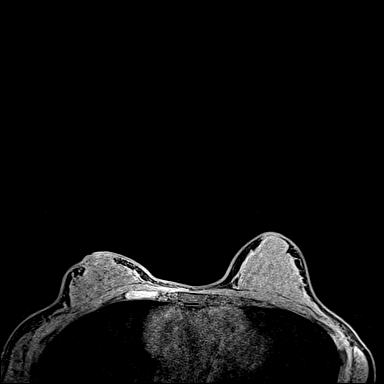
[im 108/144]
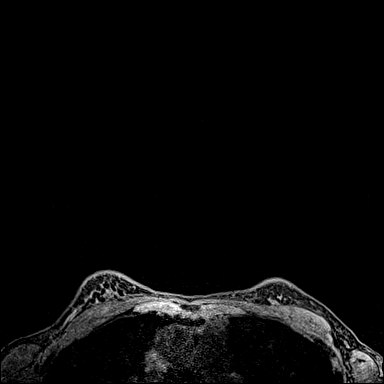
[im 144/144]
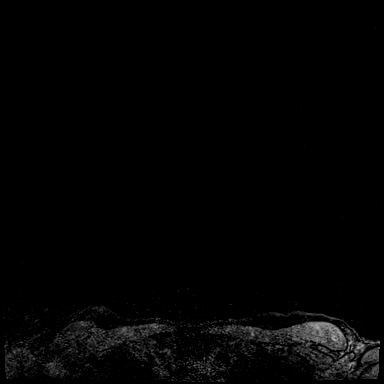

[Series 5: fl3d post immediate · axial · 1.2mm · 0.86mm/px · z∈[-71,+100]mm · 5 of 144 slices shown (1 of 3)]
[im 1/144]
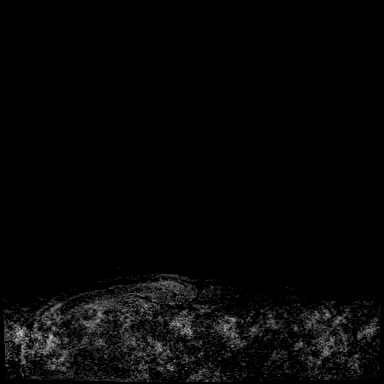
[im 36/144]
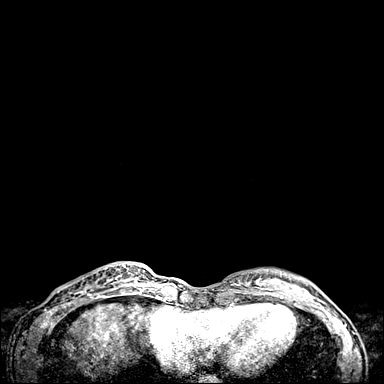
[im 72/144]
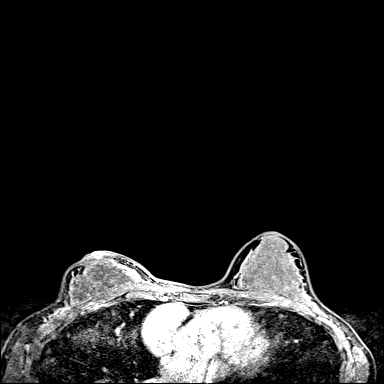
[im 108/144]
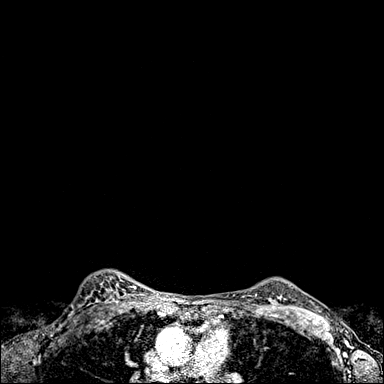
[im 144/144]
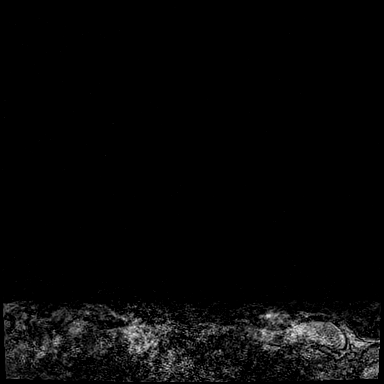

[Series 6: fl3d post immediate · axial · 1.2mm · 0.86mm/px · z∈[-71,+100]mm · 5 of 144 slices shown (2 of 3)]
[im 1/144]
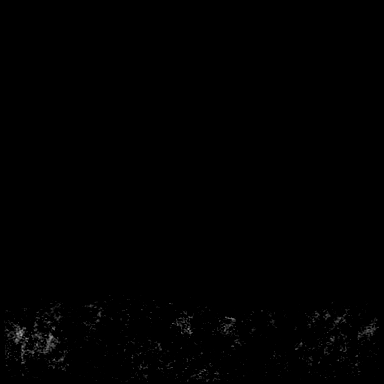
[im 36/144]
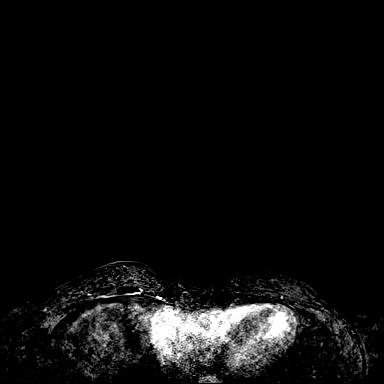
[im 72/144]
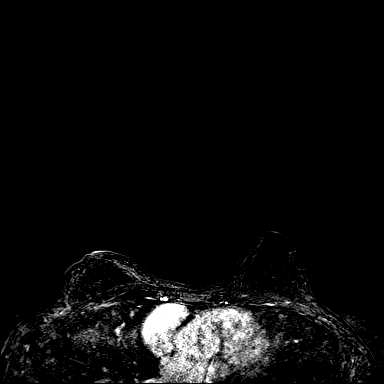
[im 108/144]
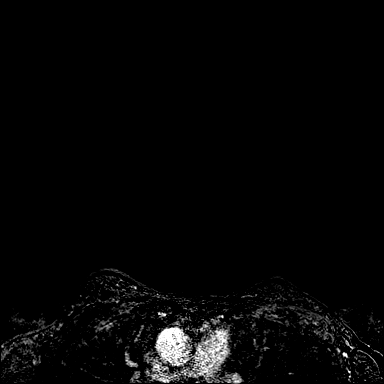
[im 144/144]
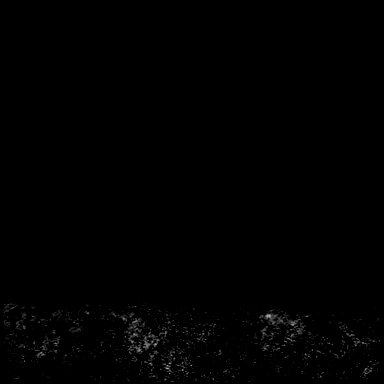

[Series 7: fl3d post immediate · axial · 172.8mm · 0.86mm/px · 1 of 1 slices shown (3 of 3)]
[im 1/1]
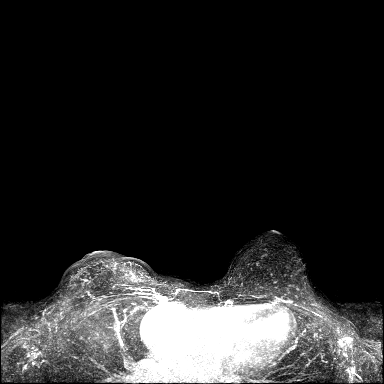

[Series 8: fl3d post 3min · axial · 1.2mm · 0.86mm/px · z∈[-71,+100]mm · 6 of 144 slices shown]
[im 1/144]
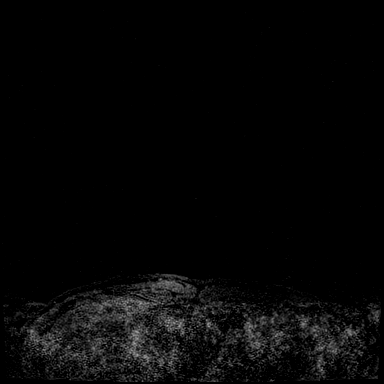
[im 29/144]
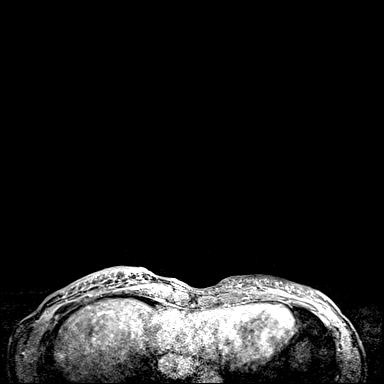
[im 58/144]
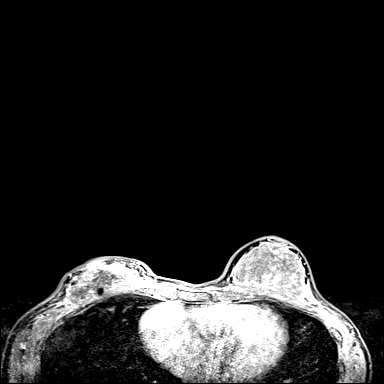
[im 86/144]
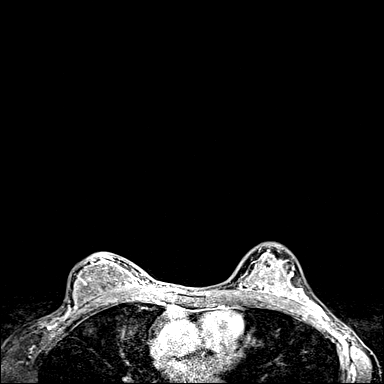
[im 115/144]
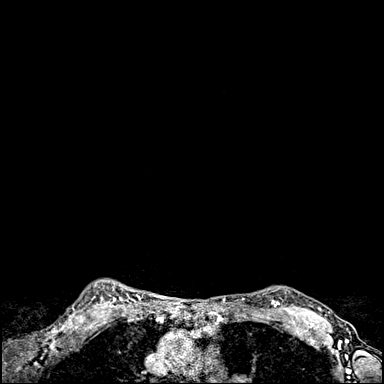
[im 144/144]
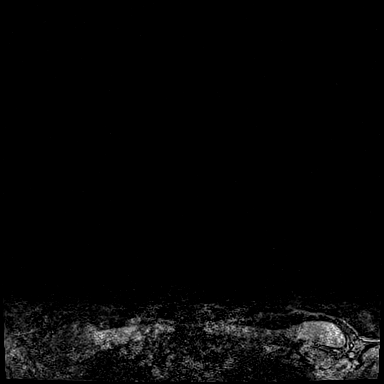

[Series 9: fl3d post 3min_sub · axial · 1.2mm · 0.86mm/px · z∈[-71,+66]mm · 5 of 144 slices shown]
[im 1/144]
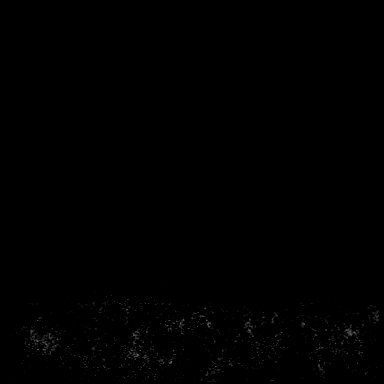
[im 29/144]
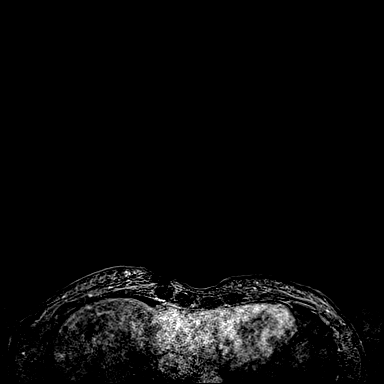
[im 58/144]
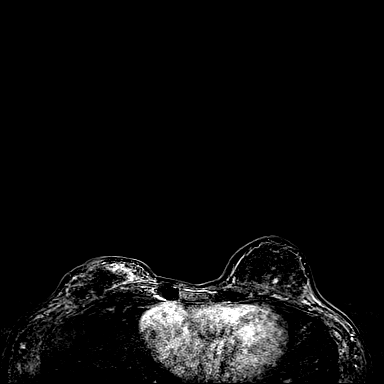
[im 86/144]
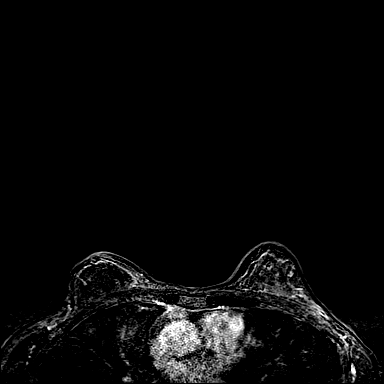
[im 115/144]
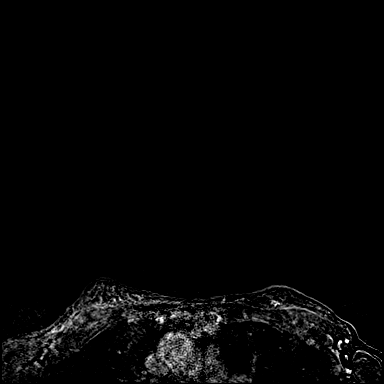

[33 of 48 positions shown; findings below may reference images not displayed]

Three-dimensional MR images were rendered by post-processing of the
original MR data on an independent workstation. The
three-dimensional MR images were interpreted, and findings are
reported in the following complete MRI report for this study. Three
dimensional images were evaluated at the independent [employer]
workstation
FINDINGS: Breast composition: d. Extreme fibroglandular tissue.

Background parenchymal enhancement: Moderate.

Right breast: Lumpectomy changes are seen in the right breast. No
mass or abnormal enhancement.

Left breast: No mass or abnormal enhancement.

Lymph nodes: No abnormal appearing lymph nodes.

Ancillary findings:  None.
IMPRESSION: No abnormal enhancement in either breast.

RECOMMENDATION:
Bilateral diagnostic mammogram in Saturday April, 2020 is recommended.

The American Cancer Society recommends annual MRI and mammography in
patients with an estimated lifetime risk of developing breast cancer
greater than 20 - 25%, or who are known or suspected to be positive
for the breast cancer gene.

BI-RADS CATEGORY  2: Benign.

## 2020-07-16 ENCOUNTER — Telehealth: Payer: Self-pay

## 2020-07-16 NOTE — Telephone Encounter (Signed)
A PA has been seen via CMM Key: [id number] - Rx #: [id number] failed: topamax, maxalt, rizatriptan, propranolol, cambia, lyrica, beta blockers, zaniflex, nortriptyline.    Information has been submitted to [place]. [employer] will review the request and notify of the determination decision directly, typically within 3 business days of your submission and once all necessary information is received.

## 2020-07-21 ENCOUNTER — Encounter: Payer: Self-pay | Admitting: Family Medicine

## 2020-08-09 ENCOUNTER — Ambulatory Visit (INDEPENDENT_AMBULATORY_CARE_PROVIDER_SITE_OTHER): Payer: BC Managed Care – PPO | Admitting: Family Medicine

## 2020-08-09 ENCOUNTER — Encounter: Payer: Self-pay | Admitting: Family Medicine

## 2020-08-09 ENCOUNTER — Ambulatory Visit: Payer: Self-pay

## 2020-08-09 ENCOUNTER — Other Ambulatory Visit: Payer: Self-pay

## 2020-08-09 VITALS — BP 94/68 | HR 93 | Ht 68.0 in | Wt 144.8 lb

## 2020-08-09 DIAGNOSIS — M7651 Patellar tendinitis, right knee: Secondary | ICD-10-CM | POA: Diagnosis not present

## 2020-08-09 DIAGNOSIS — M25561 Pain in right knee: Secondary | ICD-10-CM | POA: Diagnosis not present

## 2020-08-09 DIAGNOSIS — G8929 Other chronic pain: Secondary | ICD-10-CM

## 2020-08-09 NOTE — Progress Notes (Signed)
   I, Holly Jenkins, LAT, ATC, am serving as scribe for Dr. Lynne Leader.  Holly Jenkins is a 44 y.o. female who presents to [facility] today for f/u of R anterior knee pain.  She was last seen by Dr. Georgina Snell on 06/28/20 and was referred to PT and advised to use Voltaren gel and a knee compression sleeve.  She is a very active individual and enjoys Body Pump and spin classes.  Since her last visit, pt reports that her R knee is improving and notes an approximate 65% improvement.  She has been PT at [facility] and is con't her PT there.  She is able to fully flex both knees and is able to do body weight squats w/ little to no pain.  However, she cannot do any loaded/weighted lower body squats or lunges.  She has done spin classes w/ minimal issue.  Diagnostic imaging: R knee XR- 06/28/20   Pertinent review of systems: No fevers or chills  Relevant historical information: Breast cancer history   Exam:  BP 94/68 (BP Location: Left Arm, Patient Position: Sitting, Cuff Size: Normal)   Pulse 93   Ht 5\' 8"  (1.727 m)   Wt 144 lb 12.8 oz (65.7 kg)   SpO2 98%   BMI 22.02 kg/m  General: Well Developed, well nourished, and in no acute distress.   MSK: Right knee normal-appearing nontender normal motion. Stable ligamentous exam. Intact strength.  Left knee normal-appearing nontender normal motion. Stable ligamentous exam Intact strength   Lab and Radiology Results Diagnostic Limited MSK Ultrasound of: Right knee and left knee Right knee patellar tendon intact small inferior patellar bursa present. Left knee patellar tendon intact tiny inferior patellar bursa Impression: Inferior patellar bursa bilaterally right worse than left      Assessment and Plan: 44 y.o. female with clinical improvement of the pain thought to be due to patellar tendinitis and infrapatellar bursitis with physical therapy.  Patient not a good candidate for nitroglycerin patch  protocol due to migraine history.  Next step if not improved with physical therapy would be steroid or PRP injection.  She will let me know if she would like to proceed with that.  Recheck back as needed.   PDMP not reviewed this encounter. Orders Placed This Encounter  Procedures  . [place] LIMITED JOINT SPACE STRUCTURES LOW BILAT(NO LINKED CHARGES)    Order Specific Question:   Reason for Exam (SYMPTOM  OR DIAGNOSIS REQUIRED)    Answer:   eval knee pain    Order Specific Question:   Preferred imaging location?    Answer:   [place]   No orders of the defined types were placed in this encounter.    Discussed warning signs or symptoms. Please see discharge instructions. Patient expresses understanding.   The above documentation has been reviewed and is accurate and complete Lynne Leader, M.D.

## 2020-08-09 NOTE — Patient Instructions (Addendum)
Thank you for coming in today.  Plan to continue home exercise and PT.  Next steps if not better enough are steroid injection or PRP injection.  Let me know.  I recommend you give it a month.

## 2020-09-19 ENCOUNTER — Ambulatory Visit: Payer: BC Managed Care – PPO | Admitting: Family Medicine

## 2020-09-24 NOTE — Progress Notes (Signed)
She continues to do very well. She continues Ajovy and propranolol. Cambia helps with abortive therapy.    Consent Form Botulism Toxin Injection For Chronic Migraine    Reviewed orally with patient, additionally signature is on file:  Botulism toxin has been approved by the Federal drug administration for treatment of chronic migraine. Botulism toxin does not cure chronic migraine and it may not be effective in some patients.  The administration of botulism toxin is accomplished by injecting a small amount of toxin into the muscles of the neck and head. Dosage must be titrated for each individual. Any benefits resulting from botulism toxin tend to wear off after 3 months with a repeat injection required if benefit is to be maintained. Injections are usually done every 3-4 months with maximum effect peak achieved by about 2 or 3 weeks. Botulism toxin is expensive and you should be sure of what costs you will incur resulting from the injection.  The side effects of botulism toxin use for chronic migraine may include:   -Transient, and usually mild, facial weakness with facial injections  -Transient, and usually mild, head or neck weakness with head/neck injections  -Reduction or loss of forehead facial animation due to forehead muscle weakness  -Eyelid drooping  -Dry eye  -Pain at the site of injection or bruising at the site of injection  -Double vision  -Potential unknown long term risks   Contraindications: You should not have Botox if you are pregnant, nursing, allergic to albumin, have an infection, skin condition, or muscle weakness at the site of the injection, or have myasthenia gravis, Lambert-Eaton syndrome, or ALS.  It is also possible that as with any injection, there may be an allergic reaction or no effect from the medication. Reduced effectiveness after repeated injections is sometimes seen and rarely infection at the injection site may occur. All care will be taken to  prevent these side effects. If therapy is given over a long time, atrophy and wasting in the muscle injected may occur. Occasionally the patient's become refractory to treatment because they develop antibodies to the toxin. In this event, therapy needs to be modified.  I have read the above information and consent to the administration of botulism toxin.    BOTOX PROCEDURE NOTE FOR MIGRAINE HEADACHE  Contraindications and precautions discussed with patient(above). Aseptic procedure was observed and patient tolerated procedure. Procedure performed by Debbora Presto, FNP-C.   The condition has existed for more than 6 months, and pt does not have a diagnosis of ALS, Myasthenia Gravis or Lambert-Eaton Syndrome.  Risks and benefits of injections discussed and pt agrees to proceed with the procedure.  Written consent obtained  These injections are medically necessary. Pt  receives good benefits from these injections. These injections do not cause sedations or hallucinations which the oral therapies may cause.   Description of procedure:  The patient was placed in a sitting position. The standard protocol was used for Botox as follows, with 5 units of Botox injected at each site:  -Procerus muscle, midline injection  -Corrugator muscle, bilateral injection  -Frontalis muscle, bilateral injection, with 2 sites each side, medial injection was performed in the upper one third of the frontalis muscle, in the region vertical from the medial inferior edge of the superior orbital rim. The lateral injection was again in the upper one third of the forehead vertically above the lateral limbus of the cornea, 1.5 cm lateral to the medial injection site.  -Temporalis muscle injection, 4 sites, bilaterally.  The first injection was 3 cm above the tragus of the ear, second injection site was 1.5 cm to 3 cm up from the first injection site in line with the tragus of the ear. The third injection site was 1.5-3 cm forward  between the first 2 injection sites. The fourth injection site was 1.5 cm posterior to the second injection site. 5th site laterally in the temporalis  muscleat the level of the outer canthus.  -Occipitalis muscle injection, 3 sites, bilaterally. The first injection was done one half way between the occipital protuberance and the tip of the mastoid process behind the ear. The second injection site was done lateral and superior to the first, 1 fingerbreadth from the first injection. The third injection site was 1 fingerbreadth superiorly and medially from the first injection site.  -Cervical paraspinal muscle injection, 2 sites, bilaterally. The first injection site was 1 cm from the midline of the cervical spine, 3 cm inferior to the lower border of the occipital protuberance. The second injection site was 1.5 cm superiorly and laterally to the first injection site.  -Trapezius muscle injection was performed at 3 sites, bilaterally. The first injection site was in the upper trapezius muscle halfway between the inflection point of the neck, and the acromion. The second injection site was one half way between the acromion and the first injection site. The third injection was done between the first injection site and the inflection point of the neck.  -masseters - bilaterally   Will return for repeat injection in 3 months.   A total of 200 units of Botox was prepared, 165 units of Botox was injected as documented above, any Botox not injected was wasted. The patient tolerated the procedure well, there were no complications of the above procedure.

## 2020-09-24 NOTE — Progress Notes (Signed)
PA submitted for Ajovy 225mg  on Cover my Meds on 09/23/2020. PA questions filled out and signed by Debbora Presto. Forms have been faxed back to [facility] and waiting review. There is a 24/72hr turn around. Key: [id number] Status: PENDING

## 2020-09-25 ENCOUNTER — Ambulatory Visit (INDEPENDENT_AMBULATORY_CARE_PROVIDER_SITE_OTHER): Payer: BC Managed Care – PPO | Admitting: Family Medicine

## 2020-09-25 DIAGNOSIS — G43709 Chronic migraine without aura, not intractable, without status migrainosus: Secondary | ICD-10-CM

## 2020-09-25 NOTE — Progress Notes (Signed)
Holly Jenkins is a 44 y.o. female came in for botox inj today. She said she has been using the coupon card for a while and she is aware her insurance will not pay for Ajovy while she is on botox. Pt will continue to use the coupon card.

## 2020-09-25 NOTE — Progress Notes (Signed)
Botox-100unitsx2 vials Lot: [id number] Expiration: 11/2022 NDC: 6295-2841-32   0.9% Sodium Chloride- 50mL total Lot: [id number] Expiration: 07/2022 NDC: 25366-440-34  Dx: Chronic Migraine B/B   Consent signed

## 2020-10-07 IMAGING — DX DG KNEE AP/LAT W/ SUNRISE*R*
3 series · 3 of 3 positions shown · non-contrast
Comparison: None.

CLINICAL DATA: Pain

EXAM:
RIGHT KNEE 3 VIEWS

[knee ap]
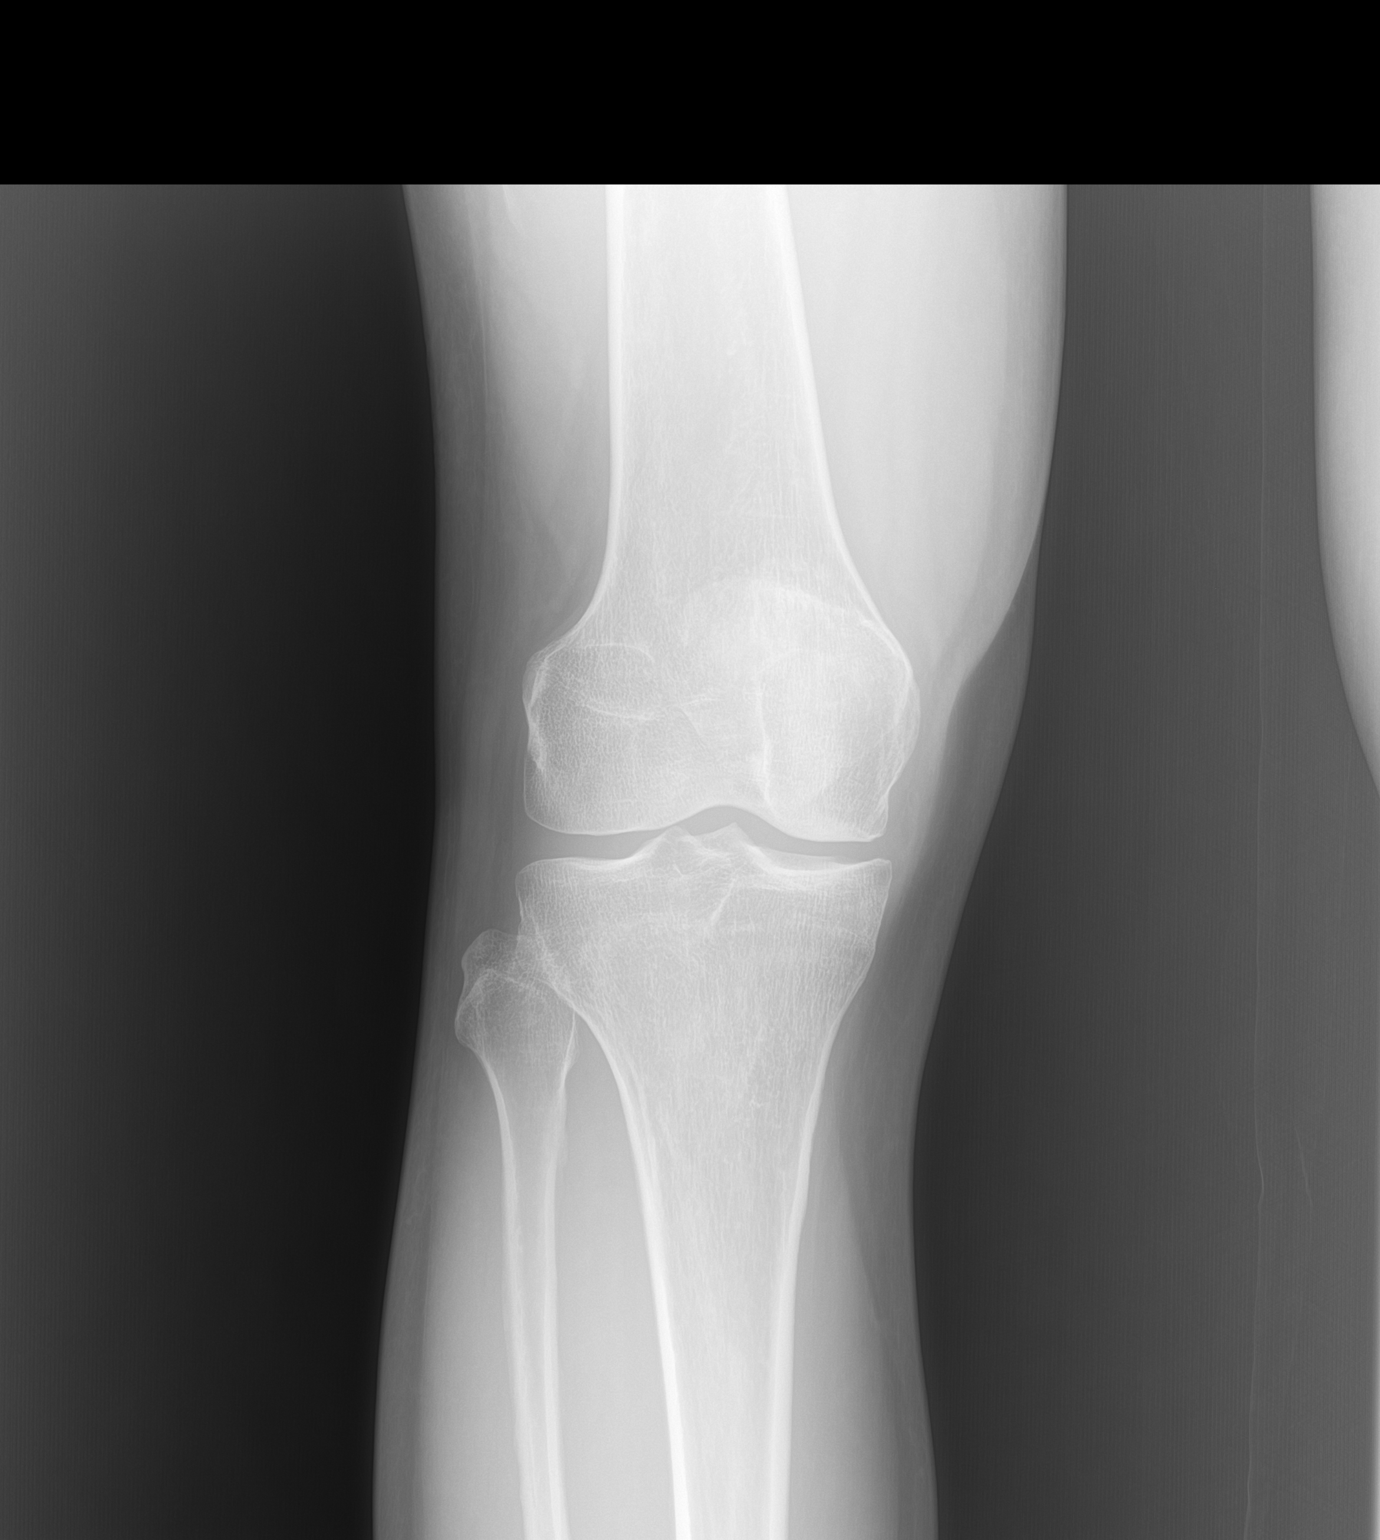

[knee lat]
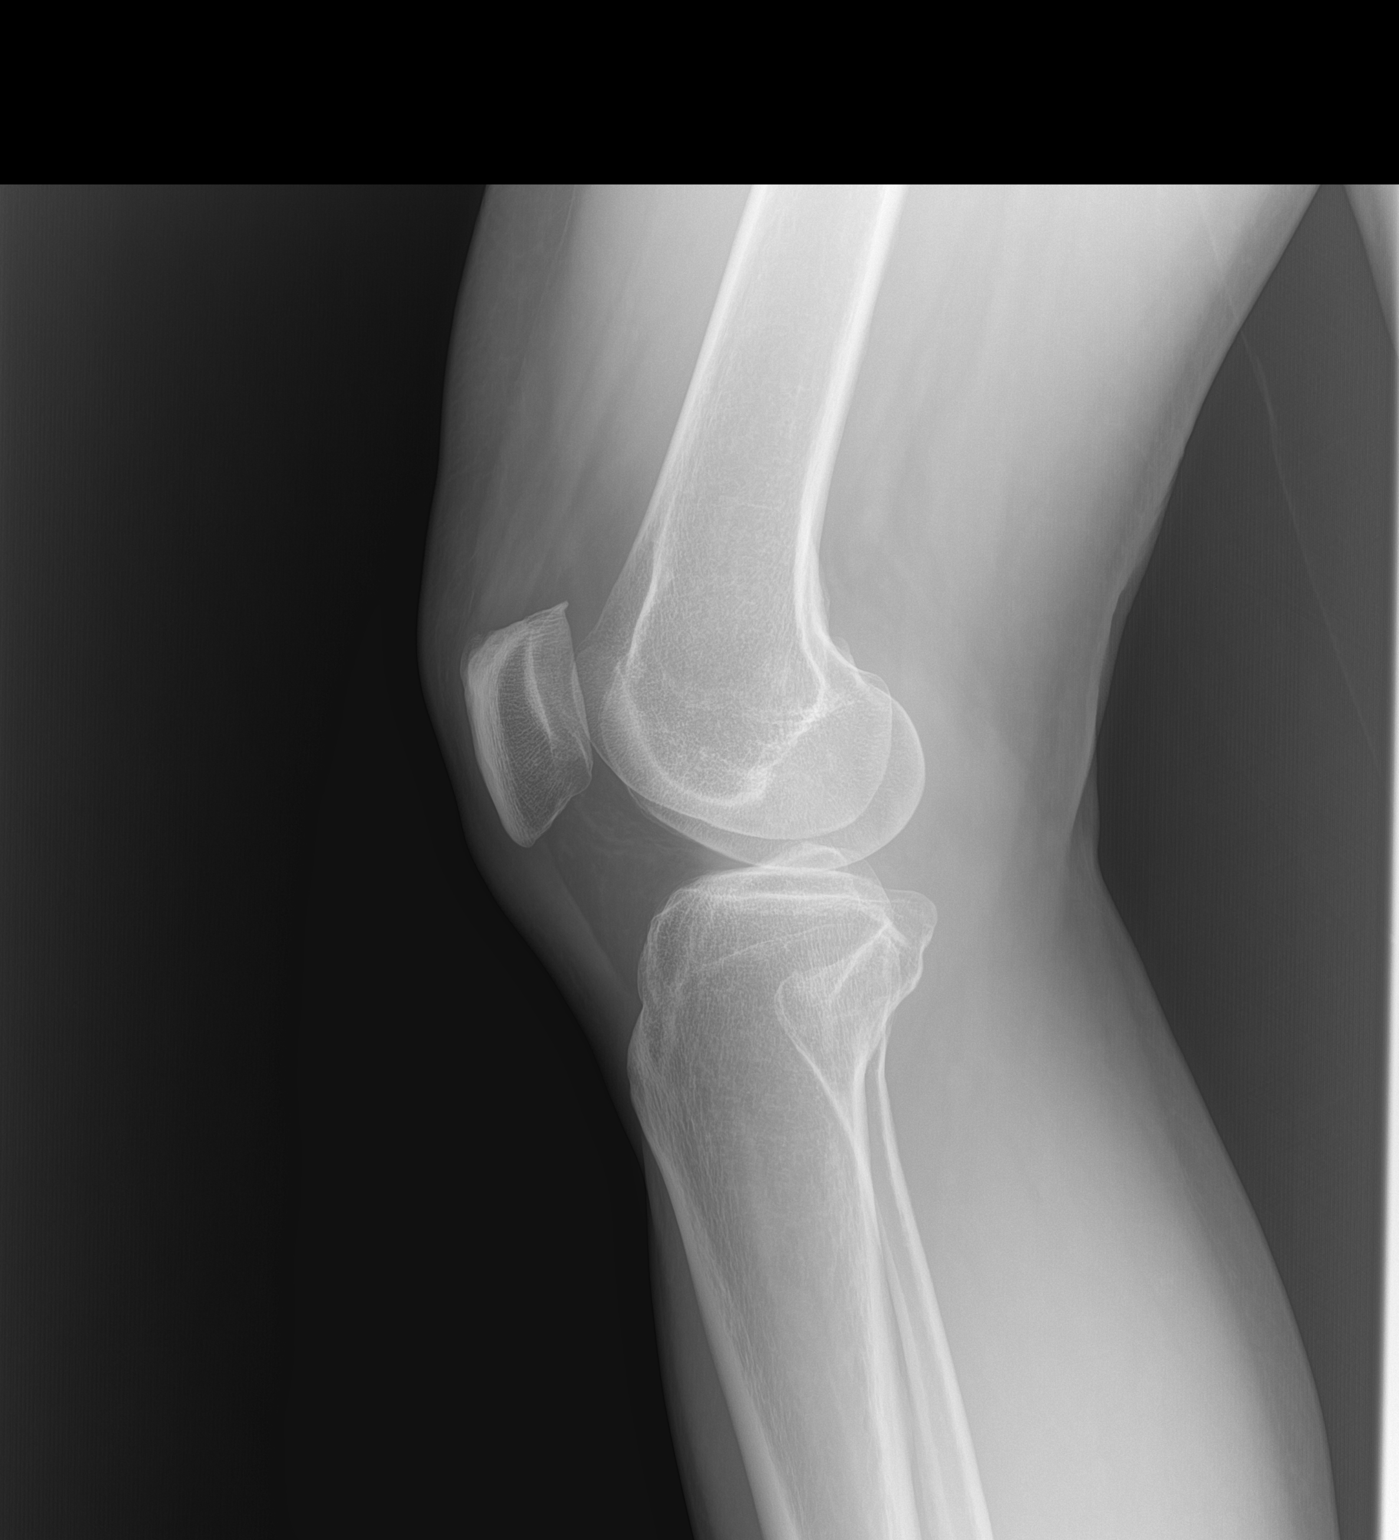

[patella]
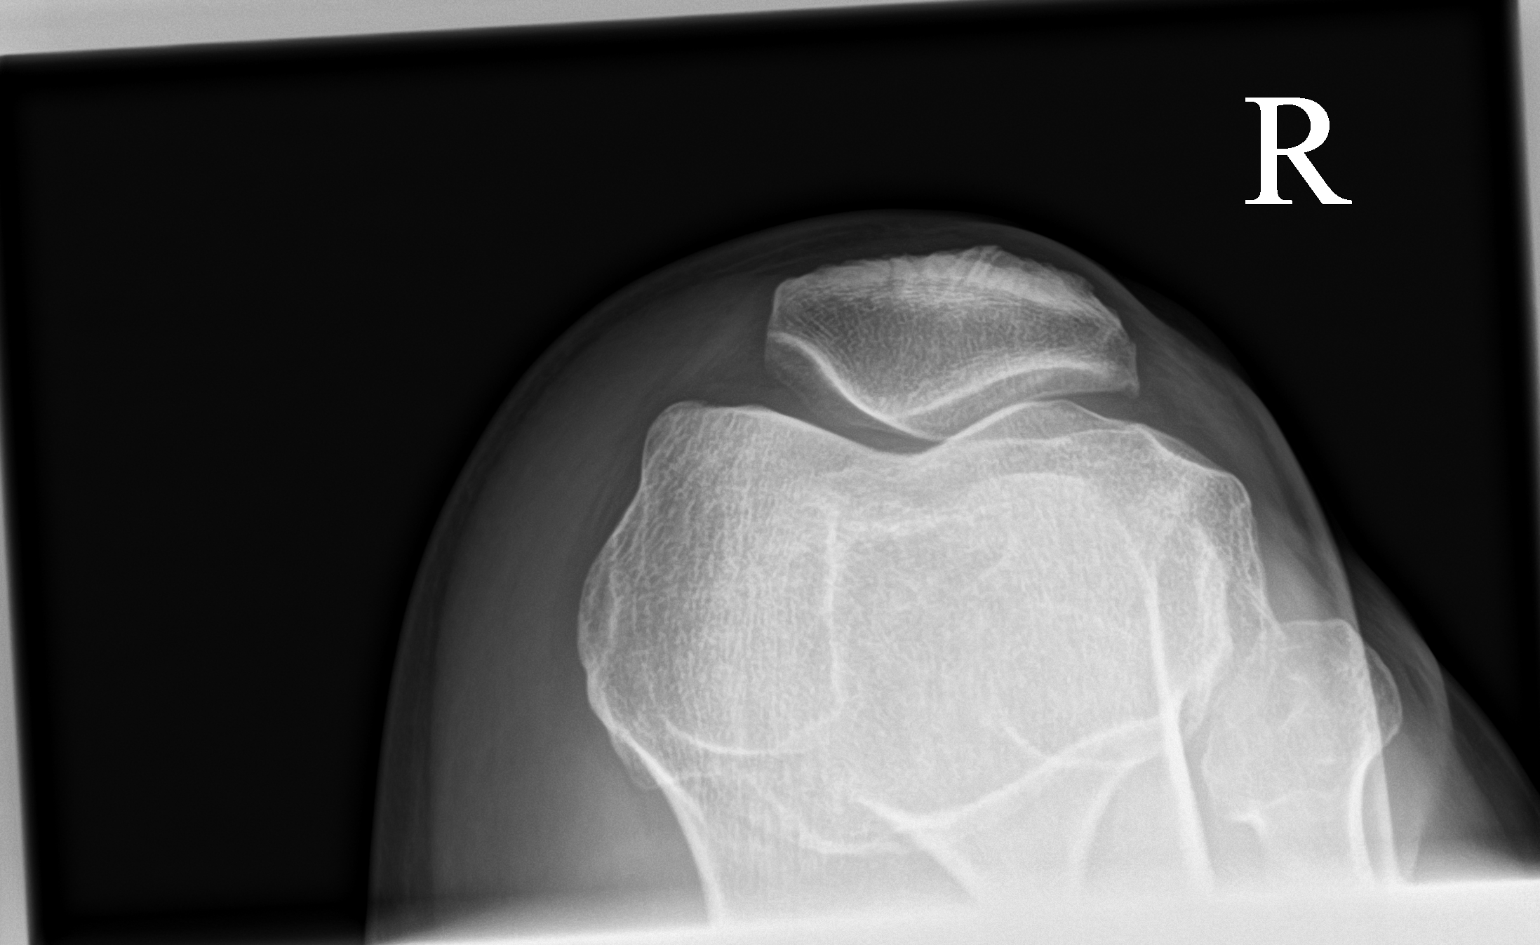

[3 of 3 positions shown; findings below may reference images not displayed]

FINDINGS: No acute fracture or dislocation. Minimal degenerative changes of
the medial compartment. No area of erosion or osseous destruction.
No unexpected radiopaque foreign body. Soft tissues are
unremarkable.
IMPRESSION: No acute fracture or dislocation.

## 2020-10-08 NOTE — Progress Notes (Signed)
Received in mail.  Coverage for AJOVY was denied.  If aimovig and emgality have been tried, and botox has not been received in last 3 months, or if taken with botox.  09-26-20.

## 2020-10-31 ENCOUNTER — Ambulatory Visit
Admission: RE | Admit: 2020-10-31 | Discharge: 2020-10-31 | Disposition: A | Discharge status: Home / Self Care | Payer: BC Managed Care – PPO | Source: Ambulatory Visit | Attending: Adult Health | Admitting: Adult Health

## 2020-10-31 ENCOUNTER — Other Ambulatory Visit: Payer: Self-pay

## 2020-10-31 DIAGNOSIS — Z170 Estrogen receptor positive status [ER+]: Secondary | ICD-10-CM

## 2020-10-31 DIAGNOSIS — C50511 Malignant neoplasm of lower-outer quadrant of right female breast: Secondary | ICD-10-CM

## 2020-11-25 ENCOUNTER — Telehealth: Payer: Self-pay | Admitting: Family Medicine

## 2020-11-25 NOTE — Telephone Encounter (Signed)
Pt. Got an email from pharmacy stating that her prescription for Cambia got denied and was wondering if it could be resubmitted

## 2020-11-26 MED ORDER — CAMBIA 50 MG PO PACK: PACK | ORAL | 5 refills | Status: DC

## 2020-11-26 NOTE — Telephone Encounter (Signed)
I called and spoke to [employer].  They needed new prescription.  I did this.  She was not sure about PA, will let us know.

## 2020-12-13 ENCOUNTER — Encounter: Payer: Self-pay | Admitting: Family Medicine

## 2020-12-17 ENCOUNTER — Telehealth: Payer: Self-pay | Admitting: Family Medicine

## 2020-12-17 NOTE — Telephone Encounter (Signed)
Pt is needing to speak to the RN regarding her Fremanezumab-vfrm (AJOVY) 225 MG/1.5ML SOSY Pt is having issues getting it with the medication card. Please advise.

## 2020-12-18 NOTE — Telephone Encounter (Signed)
Spoke to AL/NP yesterday, she is to speak to Dr. Jaynee Eagles and will get back with pt about this (omnibus act, if pay out of pocket for medication, if pt asks not to relay to insurance, then per HIPPA we are required not to relay this information to insurance.  I called [facility]/ AJOVY and [facility] [phone] relayed that beginning first of year that first 2 fills will be $5.00, after that requires PA).  That is what her profile states.  She states that she has 2.5 wks until her next injection.

## 2020-12-18 NOTE — Telephone Encounter (Signed)
Initiated [place] for ajovy 225mg  /1.5 ml syringes.  72 hour pending determination.  G43.709,  failed: topamax, maxalt, rizatriptan, propranolol, cambia, lyrica, beta blockers, zaniflex, nortriptyline, botox (on currently).

## 2020-12-23 ENCOUNTER — Telehealth: Payer: Self-pay | Admitting: Family Medicine

## 2020-12-23 NOTE — Telephone Encounter (Signed)
Received denial for ajovy due to pt on BOTOX or has received in the last 3 months.  REF # [id number].

## 2020-12-23 NOTE — Telephone Encounter (Signed)
Pt called, need to switch medication, insurance is not covering. Would like a call from the nurse.

## 2020-12-23 NOTE — Progress Notes (Signed)
   I, Holly Jenkins, LAT, ATC, am serving as scribe for Dr. Lynne Leader.  Holly Jenkins is a 45 y.o. female who presents to [place] at [facility] today for continued R knee pain thought to be due to patellar tendinopathy and infrapatellar bursitis. Pt was last seen by Dr. Georgina Snell on 08/09/20 and was referred to PT at [facility]. Since her last visit, pt reports that her R knee has been improving but she feels like she con't to re-injure it.  She has been going to PT at [facility] in the [facility] location.  She states that she sees a functional medicine doctor who recently did a R knee injection (Prolo and BPC-157) and felt that she has a R knee effusion, particularly behind the patella.  She states that she is generally frustrated w/ her R knee and is wondering if she needs more testing ie an MRI.  She also con't to have L knee pain.  Dx imaging: 06/28/20 R knee XR  Pertinent review of systems: No fevers or chills  Relevant historical information: History of breast cancer 2020   Exam:  BP 98/62 (BP Location: Left Arm, Patient Position: Sitting, Cuff Size: Normal)   Pulse 85   Ht 5\' 8"  (1.727 m)   Wt 150 lb 3.2 oz (68.1 kg)   SpO2 93%   BMI 22.84 kg/m  General: Well Developed, well nourished, and in no acute distress.   MSK: Right knee normal-appearing mildly tender palpation anterior medial knee.  Normal motion stable ligamentous exam. Palpable collection with knee extension. Positive McMurray's test.    Lab and Radiology Results   EXAM: RIGHT KNEE 3 VIEWS  COMPARISON:  None.  FINDINGS: No acute fracture or dislocation. Minimal degenerative changes of the medial compartment. No area of erosion or osseous destruction. No unexpected radiopaque foreign body. Soft tissues are unremarkable.  IMPRESSION: No acute fracture or dislocation.   Electronically Signed   By: Valentino Saxon MD   On: 06/28/2020 16:44  I, Lynne Leader, personally (independently) visualized and performed the interpretation of the images attached in this note.    Assessment and Plan: 45 y.o. female with persistent right knee pain ongoing since September 2021.  Patient is failing conservative management including dedicated trial of physical therapy.  At this point proceed to MRI to further characterize cause of pain and for potential surgical planning.  Patient does have mechanical symptoms therefore some concern for potential meniscus tear and potential surgical options.   PDMP not reviewed this encounter. Orders Placed This Encounter  Procedures  . MR Knee Right Wo Contrast    Standing Status:   Future    Standing Expiration Date:   12/24/2021    Order Specific Question:   What is the patient's sedation requirement?    Answer:   No Sedation    Order Specific Question:   Does the patient have a pacemaker or implanted devices?    Answer:   No    Order Specific Question:   Preferred imaging location?    Answer:   Product/process development scientist (table limit-350lbs)   No orders of the defined types were placed in this encounter.    Discussed warning signs or symptoms. Please see discharge instructions. Patient expresses understanding.   The above documentation has been reviewed and is accurate and complete Lynne Leader, M.D.

## 2020-12-23 NOTE — Telephone Encounter (Signed)
Noted  

## 2020-12-24 ENCOUNTER — Encounter: Payer: Self-pay | Admitting: Family Medicine

## 2020-12-24 ENCOUNTER — Other Ambulatory Visit: Payer: Self-pay

## 2020-12-24 ENCOUNTER — Ambulatory Visit (INDEPENDENT_AMBULATORY_CARE_PROVIDER_SITE_OTHER): Payer: BC Managed Care – PPO | Admitting: Family Medicine

## 2020-12-24 VITALS — BP 98/62 | HR 85 | Ht 68.0 in | Wt 150.2 lb

## 2020-12-24 DIAGNOSIS — M25561 Pain in right knee: Secondary | ICD-10-CM

## 2020-12-24 DIAGNOSIS — G8929 Other chronic pain: Secondary | ICD-10-CM | POA: Diagnosis not present

## 2020-12-24 NOTE — Telephone Encounter (Signed)
Filled out [employer] PA form and gave to NP to sign.

## 2020-12-24 NOTE — Telephone Encounter (Signed)
Faxed signed PA form to [employer] with notes. 

## 2020-12-24 NOTE — Patient Instructions (Signed)
Thank you for coming in today.  You should hear from MRI scheduling within 1 week. If you do not hear please let me know.   Recheck following MRI.    

## 2020-12-25 ENCOUNTER — Telehealth: Payer: Self-pay | Admitting: Hematology and Oncology

## 2020-12-25 NOTE — Telephone Encounter (Signed)
R/s appts per 3/16 sch msg. Pt aware.

## 2020-12-26 ENCOUNTER — Ambulatory Visit: Payer: BC Managed Care – PPO | Admitting: Family Medicine

## 2020-12-26 NOTE — Telephone Encounter (Signed)
Received approval from [facility] via fax. Reference #[id number] (12/24/20- 06/22/21).

## 2020-12-26 NOTE — Progress Notes (Signed)
12/30/2020 ALL: She is doing fairly well. Insurance will not pay for Botox or Ajovy as she is on both. She has been paying cash for Botox and using copay card for Ajovy but can no longer use copay card. She will proceed with Botox and propranolol. She has been doing very well with occasional use of Cambia. We will monitor closely.   09/25/2020 ALL: She continues to do very well. She continues Ajovy and propranolol. Cambia helps with abortive therapy.    Consent Form Botulism Toxin Injection For Chronic Migraine    Reviewed orally with patient, additionally signature is on file:  Botulism toxin has been approved by the Federal drug administration for treatment of chronic migraine. Botulism toxin does not cure chronic migraine and it may not be effective in some patients.  The administration of botulism toxin is accomplished by injecting a small amount of toxin into the muscles of the neck and head. Dosage must be titrated for each individual. Any benefits resulting from botulism toxin tend to wear off after 3 months with a repeat injection required if benefit is to be maintained. Injections are usually done every 3-4 months with maximum effect peak achieved by about 2 or 3 weeks. Botulism toxin is expensive and you should be sure of what costs you will incur resulting from the injection.  The side effects of botulism toxin use for chronic migraine may include:   -Transient, and usually mild, facial weakness with facial injections  -Transient, and usually mild, head or neck weakness with head/neck injections  -Reduction or loss of forehead facial animation due to forehead muscle weakness  -Eyelid drooping  -Dry eye  -Pain at the site of injection or bruising at the site of injection  -Double vision  -Potential unknown long term risks   Contraindications: You should not have Botox if you are pregnant, nursing, allergic to albumin, have an infection, skin condition, or muscle weakness at  the site of the injection, or have myasthenia gravis, Lambert-Eaton syndrome, or ALS.  It is also possible that as with any injection, there may be an allergic reaction or no effect from the medication. Reduced effectiveness after repeated injections is sometimes seen and rarely infection at the injection site may occur. All care will be taken to prevent these side effects. If therapy is given over a long time, atrophy and wasting in the muscle injected may occur. Occasionally the patient's become refractory to treatment because they develop antibodies to the toxin. In this event, therapy needs to be modified.  I have read the above information and consent to the administration of botulism toxin.    BOTOX PROCEDURE NOTE FOR MIGRAINE HEADACHE  Contraindications and precautions discussed with patient(above). Aseptic procedure was observed and patient tolerated procedure. Procedure performed by Debbora Presto, FNP-C.   The condition has existed for more than 6 months, and pt does not have a diagnosis of ALS, Myasthenia Gravis or Lambert-Eaton Syndrome.  Risks and benefits of injections discussed and pt agrees to proceed with the procedure.  Written consent obtained  These injections are medically necessary. Pt  receives good benefits from these injections. These injections do not cause sedations or hallucinations which the oral therapies may cause.   Description of procedure:  The patient was placed in a sitting position. The standard protocol was used for Botox as follows, with 5 units of Botox injected at each site:  -Procerus muscle, midline injection  -Corrugator muscle, bilateral injection  -Frontalis muscle, bilateral injection, with 2  sites each side, medial injection was performed in the upper one third of the frontalis muscle, in the region vertical from the medial inferior edge of the superior orbital rim. The lateral injection was again in the upper one third of the forehead vertically  above the lateral limbus of the cornea, 1.5 cm lateral to the medial injection site.  -Temporalis muscle injection, 4 sites, bilaterally. The first injection was 3 cm above the tragus of the ear, second injection site was 1.5 cm to 3 cm up from the first injection site in line with the tragus of the ear. The third injection site was 1.5-3 cm forward between the first 2 injection sites. The fourth injection site was 1.5 cm posterior to the second injection site. 5th site laterally in the temporalis  muscleat the level of the outer canthus.  -Occipitalis muscle injection, 3 sites, bilaterally. The first injection was done one half way between the occipital protuberance and the tip of the mastoid process behind the ear. The second injection site was done lateral and superior to the first, 1 fingerbreadth from the first injection. The third injection site was 1 fingerbreadth superiorly and medially from the first injection site.  -Cervical paraspinal muscle injection, 2 sites, bilaterally. The first injection site was 1 cm from the midline of the cervical spine, 3 cm inferior to the lower border of the occipital protuberance. The second injection site was 1.5 cm superiorly and laterally to the first injection site.  -Trapezius muscle injection was performed at 3 sites, bilaterally. The first injection site was in the upper trapezius muscle halfway between the inflection point of the neck, and the acromion. The second injection site was one half way between the acromion and the first injection site. The third injection was done between the first injection site and the inflection point of the neck.  -masseters - bilaterally   Will return for repeat injection in 3 months.   A total of 200 units of Botox was prepared, 165 units of Botox was injected as documented above, any Botox not injected was wasted. The patient tolerated the procedure well, there were no complications of the above procedure.

## 2020-12-26 NOTE — Telephone Encounter (Signed)
I noticed that Holly Jenkins's name is misspelled on the PA approval fax. There is a K in her last name where there should be an X. I called [employer] [place] to fix this to make sure that no claims are denied due to this. I spoke to Chanel who states that she will fix this and fax an updated approval letter.

## 2020-12-29 ENCOUNTER — Ambulatory Visit (INDEPENDENT_AMBULATORY_CARE_PROVIDER_SITE_OTHER): Payer: BC Managed Care – PPO

## 2020-12-29 ENCOUNTER — Other Ambulatory Visit: Payer: Self-pay

## 2020-12-29 DIAGNOSIS — G8929 Other chronic pain: Secondary | ICD-10-CM

## 2020-12-29 DIAGNOSIS — M25561 Pain in right knee: Secondary | ICD-10-CM | POA: Diagnosis not present

## 2020-12-29 DIAGNOSIS — M7651 Patellar tendinitis, right knee: Secondary | ICD-10-CM | POA: Diagnosis not present

## 2020-12-30 ENCOUNTER — Ambulatory Visit (INDEPENDENT_AMBULATORY_CARE_PROVIDER_SITE_OTHER): Payer: BC Managed Care – PPO | Admitting: Family Medicine

## 2020-12-30 DIAGNOSIS — G43709 Chronic migraine without aura, not intractable, without status migrainosus: Secondary | ICD-10-CM

## 2020-12-30 NOTE — Progress Notes (Signed)
I, Holly Jenkins, LAT, ATC, am serving as scribe for Dr. Lynne Jenkins.  Holly Jenkins is a 45 y.o. female who presents to [place] at [facility] today for f/u R knee pain and MRI review.  She was last seen by Dr. Georgina Jenkins on 12/24/20 and noted mild improvement in her knee.  She has been doing PT at [facility] and recently saw a functional medicine doctor who did a Prolo and BPC-157 injection.  Since her last visit w/ Dr. Georgina Jenkins, pt reports that her R knee pain is the same as previously.  Diagnostic imaging: R knee MRI- 12/29/20; R knee XR-06/28/20  Pertinent review of systems: No fevers or chills  Relevant historical information: History of breast cancer   Exam:  BP 102/68 (BP Location: Right Arm, Patient Position: Sitting, Cuff Size: Normal)   Pulse 81   Ht 5\' 8"  (1.727 m)   Wt 149 lb 6.4 oz (67.8 kg)   SpO2 98%   BMI 22.72 kg/m  General: Well Developed, well nourished, and in no acute distress.   MSK: Right knee normal-appearing normal motion    Lab and Radiology Results No results found for this or any previous visit (from the past 72 hour(s)). MR Knee Right Wo Contrast  Result Date: 12/30/2020 CLINICAL DATA:  Right knee pain.  Patellar tendinitis EXAM: MRI OF THE RIGHT KNEE WITHOUT CONTRAST TECHNIQUE: Multiplanar, multisequence MR imaging of the knee was performed. No intravenous contrast was administered. COMPARISON:  X-ray 06/28/2020 FINDINGS: MENISCI Medial meniscus:  Intact. Lateral meniscus:  Intact. LIGAMENTS Cruciates:  Intact ACL and PCL. Collaterals: Medial collateral ligament is intact. Lateral collateral ligament complex is intact. CARTILAGE Patellofemoral: Partial-thickness chondral surface irregularity of the medial patellar facet with near full thickness fissures (series 3, images 10-11). Trochlear cartilage appears intact. Medial:  No chondral defect. Lateral:  No chondral defect. Joint: No joint effusion. Mild edema within Hoffa's fat.  Small volume deep infrapatellar bursal fluid. Popliteal Fossa: No significant Baker's cyst. Trace semimembranosus-tibial collateral ligament bursal fluid. Intact popliteus tendon. Extensor Mechanism: Intact quadriceps tendon and patellar tendon. Mild patellar tendinosis. Bones: No focal marrow signal abnormality. No fracture or dislocation. Other: Mild infrapatellar subcutaneous edema without focal fluid collection. Mild intramuscular edema within the visualized soleus muscle. IMPRESSION: 1. Partial-thickness chondral surface irregularity of the medial patellar facet with near full thickness fissures. 2. Mild patellar tendinosis.  Mild deep infrapatellar bursitis. 3. Mild intramuscular edema within the visualized soleus muscle, which may reflect muscle strain. 4. Intact menisci and ligamentous structures. Electronically Signed   By: Holly Jenkins D.O.   On: 12/30/2020 10:38   I, Holly Jenkins, personally (independently) visualized and performed the interpretation of the images attached in this note.    Assessment and Plan: 45 y.o. female with right knee pain thought to be multifactorial.  Some of the knee pain due to the cartilage loss of the medial patellar facet and some of the pain due to patellar tendinitis and infrapatellar bursitis.  Patient does not have posterior knee pain so the soleus edema is likely nonsignificant.  Discussed options.  Plan to proceed with hyaluronic acid injection intra-articular to address the patellar facet related pain.  Consider steroid injection infrapatellar bursa as needed in the future.   PDMP not reviewed this encounter. Orders Placed This Encounter  Procedures  . [place] LIMITED JOINT SPACE STRUCTURES LOW RIGHT(NO LINKED CHARGES)    Order Specific Question:   Reason for Exam (SYMPTOM  OR DIAGNOSIS REQUIRED)  Answer:   R knee pain    Order Specific Question:   Preferred imaging location?    Answer:   [place]   No orders of the  defined types were placed in this encounter.    Discussed warning signs or symptoms. Please see discharge instructions. Patient expresses understanding.   The above documentation has been reviewed and is accurate and complete Holly Jenkins, M.D.  Total encounter time 20 minutes including face-to-face time with the patient and, reviewing past medical record, and charting on the date of service.   Discussed treatment plan and options and reviewed MRI findings.

## 2020-12-30 NOTE — Progress Notes (Signed)
MRI of the knee shows some areas of cartilage loss and under the kneecap.  Additionally shows some patellar tendinitis and swelling underneath the tendon.  It also shows some injury to the deep calf muscle at the back of the knee.  The meniscus and ligaments of the knee look okay.   We will follow up with this issue in detail on your appointment tomorrow.

## 2020-12-30 NOTE — Progress Notes (Signed)
Botox- 200 units x 1 vial Lot: [id number] Expiration: 09/2023 NDC: 6886-4847-20  Bacteriostatic 0.9% Sodium Chloride- 10mL total [id number] Expiration: 2/23 NDC: 44514-604-79  Dx: V87.215 BB

## 2020-12-31 ENCOUNTER — Encounter: Payer: Self-pay | Admitting: Family Medicine

## 2020-12-31 ENCOUNTER — Ambulatory Visit (INDEPENDENT_AMBULATORY_CARE_PROVIDER_SITE_OTHER): Payer: BC Managed Care – PPO | Admitting: Family Medicine

## 2020-12-31 ENCOUNTER — Other Ambulatory Visit: Payer: Self-pay

## 2020-12-31 ENCOUNTER — Ambulatory Visit: Payer: Self-pay

## 2020-12-31 VITALS — BP 102/68 | HR 81 | Ht 68.0 in | Wt 149.4 lb

## 2020-12-31 DIAGNOSIS — M25561 Pain in right knee: Secondary | ICD-10-CM | POA: Diagnosis not present

## 2020-12-31 DIAGNOSIS — G8929 Other chronic pain: Secondary | ICD-10-CM | POA: Diagnosis not present

## 2020-12-31 NOTE — Patient Instructions (Signed)
Thank you for coming in today.  We will work on authorization for the gel shots for the knee.   Continue home exercises.   I will see you soon.   Likely orthovisc or gelsyn.

## 2021-01-10 ENCOUNTER — Ambulatory Visit: Payer: BC Managed Care – PPO | Admitting: Family Medicine

## 2021-01-14 ENCOUNTER — Ambulatory Visit (INDEPENDENT_AMBULATORY_CARE_PROVIDER_SITE_OTHER): Payer: BC Managed Care – PPO | Admitting: Family Medicine

## 2021-01-14 ENCOUNTER — Ambulatory Visit: Payer: Self-pay

## 2021-01-14 ENCOUNTER — Other Ambulatory Visit: Payer: Self-pay

## 2021-01-14 DIAGNOSIS — M25561 Pain in right knee: Secondary | ICD-10-CM

## 2021-01-14 DIAGNOSIS — G8929 Other chronic pain: Secondary | ICD-10-CM | POA: Diagnosis not present

## 2021-01-14 NOTE — Progress Notes (Signed)
Holly Jenkins presents to clinic today for Synvisc injection right knee 1/3  Procedure: Real-time Ultrasound Guided Injection of right knee superior lateral patellar space Device: Philips Affiniti 50G Images permanently stored and available for review in PACS Verbal informed consent obtained.  Discussed risks and benefits of procedure. Warned about infection bleeding damage to structures skin hypopigmentation and fat atrophy among others. Patient expresses understanding and agreement Time-out conducted.   Noted no overlying erythema, induration, or other signs of local infection.   Skin prepped in a sterile fashion.   Local anesthesia: Topical Ethyl chloride.   With sterile technique and under real time ultrasound guidance:  Synvisc 2 mL injected into right knee joint. Fluid seen entering the joint capsule.   Completed without difficulty   Advised to call if fevers/chills, erythema, induration, drainage, or persistent bleeding.   Images permanently stored and available for review in the ultrasound unit.  Impression: Technically successful ultrasound guided injection.    Lot # [id number]

## 2021-01-14 NOTE — Patient Instructions (Signed)
Thank you for coming in today.  You received an injection today. Seek immediate medical attention if the joint becomes red, extremely painful, or is oozing fluid.  Schedule a follow up for your 2nd injection next week.

## 2021-01-21 ENCOUNTER — Other Ambulatory Visit: Payer: Self-pay

## 2021-01-21 ENCOUNTER — Ambulatory Visit: Payer: Self-pay

## 2021-01-21 ENCOUNTER — Ambulatory Visit (INDEPENDENT_AMBULATORY_CARE_PROVIDER_SITE_OTHER): Payer: BC Managed Care – PPO | Admitting: Family Medicine

## 2021-01-21 DIAGNOSIS — G8929 Other chronic pain: Secondary | ICD-10-CM

## 2021-01-21 DIAGNOSIS — M25561 Pain in right knee: Secondary | ICD-10-CM | POA: Diagnosis not present

## 2021-01-21 NOTE — Progress Notes (Signed)
Holly Jenkins presents to clinic today for Synvisc injection right knee 2/3  Procedure: Real-time Ultrasound Guided Injection of right knee superior lateral patellar space Device: Philips Affiniti 50G Images permanently stored and available for review in PACS Verbal informed consent obtained.  Discussed risks and benefits of procedure. Warned about infection bleeding damage to structures skin hypopigmentation and fat atrophy among others. Patient expresses understanding and agreement Time-out conducted.   Noted no overlying erythema, induration, or other signs of local infection.   Skin prepped in a sterile fashion.   Local anesthesia: Topical Ethyl chloride.   With sterile technique and under real time ultrasound guidance:  Synvisc 2 mL injected into right knee joint. Fluid seen entering the joint capsule.   Completed without difficulty   Advised to call if fevers/chills, erythema, induration, drainage, or persistent bleeding.   Images permanently stored and available for review in the ultrasound unit.  Impression: Technically successful ultrasound guided injection.  Lot number: [id number]  Return in 1 week for Synvisc injection right knee 3/3

## 2021-01-21 NOTE — Patient Instructions (Signed)
Thank you for coming in today.  Call or go to the ER if you develop a large red swollen joint with extreme pain or oozing puss.   Return in 1 week.

## 2021-01-28 ENCOUNTER — Other Ambulatory Visit: Payer: Self-pay

## 2021-01-28 ENCOUNTER — Ambulatory Visit (INDEPENDENT_AMBULATORY_CARE_PROVIDER_SITE_OTHER): Payer: BC Managed Care – PPO | Admitting: Family Medicine

## 2021-01-28 ENCOUNTER — Ambulatory Visit: Payer: Self-pay

## 2021-01-28 DIAGNOSIS — M25561 Pain in right knee: Secondary | ICD-10-CM

## 2021-01-28 DIAGNOSIS — G8929 Other chronic pain: Secondary | ICD-10-CM | POA: Diagnosis not present

## 2021-01-28 NOTE — Patient Instructions (Addendum)
You had your final R knee Synvisc injection today.  Call or go to the ER if you develop a large red swollen joint with extreme pain or oozing puss.   Follow-up as needed.

## 2021-01-28 NOTE — Progress Notes (Signed)
Holly Jenkins presents to clinic today for Synvisc injection right knee 3/3  Procedure: Real-time Ultrasound Guided Injection of knee superior lateral patellar space Device: Philips Affiniti 50G Images permanently stored and available for review in PACS Verbal informed consent obtained.  Discussed risks and benefits of procedure. Warned about infection bleeding damage to structures skin hypopigmentation and fat atrophy among others. Patient expresses understanding and agreement Time-out conducted.   Noted no overlying erythema, induration, or other signs of local infection.   Skin prepped in a sterile fashion.   Local anesthesia: Topical Ethyl chloride.   With sterile technique and under real time ultrasound guidance:  Synvisc 2 mL injected into right knee. Fluid seen entering the capsule.   Completed without difficulty   Advised to call if fevers/chills, erythema, induration, drainage, or persistent bleeding.   Images permanently stored and available for review in the ultrasound unit.  Impression: Technically successful ultrasound guided injection.  Lot number: [id number]  Recheck as needed

## 2021-03-19 ENCOUNTER — Ambulatory Visit: Payer: BC Managed Care – PPO | Admitting: Hematology and Oncology

## 2021-03-19 NOTE — Progress Notes (Addendum)
Patient Care Team: Marda Stalker, PA-C as PCP - General (Family Medicine) Mauro Kaufmann, RN as Oncology Nurse Navigator Rockwell Germany, RN as Oncology Nurse Navigator Fanny Skates, MD as Consulting Physician (General Surgery) Nicholas Lose, MD as Consulting Physician (Hematology and Oncology) Eppie Gibson, MD as Attending Physician (Radiation Oncology)  DIAGNOSIS:    ICD-10-CM   1. Malignant neoplasm of lower-outer quadrant of right breast of female, estrogen receptor positive ([place])  C50.511    Z17.0       SUMMARY OF ONCOLOGIC HISTORY: Oncology History  Malignant neoplasm of lower-outer quadrant of right breast of female, estrogen receptor positive ([place])  04/25/2019 Initial Diagnosis   Right breast mass identified on clinical examination. Mammogram showed 1.6cm indeterminate mass in the lower outer right breast, no abnormal axillary lymph nodes. 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   05/03/2019 Cancer Staging   Staging form: Breast, AJCC 8th Edition - Clinical stage from 05/03/2019: Stage IA (cT1c, cN0, cM0, G2, ER+, PR+, HER2-) - Signed by Nicholas Lose, MD on 05/03/2019    05/03/2019 Genetic Testing   4 Variants of Uncertain Significance: VUS in BMPR1A called c.565T>C, VUS in NBN called c.724>A, VUS in NTHL1 called c.704G>A, and VUS in POLE c.1847G>A identified on the [facility] Breast Cancer STAT Panel  + Common Hereditary Cancers Panel. The STAT Breast cancer panel offered by [facility] includes sequencing and rearrangement analysis for the following 9 genes:  ATM, BRCA1, BRCA2, CDH1, CHEK2, PALB2, PTEN, STK11 and TP53.   The Common Hereditary Cancers Panel offered by [facility] includes sequencing and/or deletion duplication testing of the following 47 genes: APC, ATM, AXIN2, BARD1, BMPR1A, BRCA1, BRCA2, BRIP1, CDH1, CDKN2A (p14ARF), CDKN2A (p16INK4a), CKD4, CHEK2, CTNNA1, DICER1, EPCAM (Deletion/duplication testing only), GREM1 (promoter region  deletion/duplication testing only), KIT, MEN1, MLH1, MSH2, MSH3, MSH6, MUTYH, NBN, NF1, NHTL1, PALB2, PDGFRA, PMS2, POLD1, POLE, PTEN, RAD50, RAD51C, RAD51D, SDHB, SDHC, SDHD, SMAD4, SMARCA4. STK11, TP53, TSC1, TSC2, and VHL.  The following genes were evaluated for sequence changes only: SDHA and HOXB13 c.251G>A variant only. The report date is 05/09/2019.   05/24/2019 Surgery   Right lumpectomy [name]): IDC, grade 1, 1.1cm, with LCIS and high grade DCIS, clear margins, and no evidence of malignancy in 4 lymph nodes.    05/24/2019 Cancer Staging   Staging form: Breast, AJCC 8th Edition - Pathologic stage from 05/24/2019: Stage IA (pT1c, pN0, cM0, G1, ER+, PR+, HER2-) - Signed by Gardenia Phlegm, NP on 06/07/2019    05/24/2019 Oncotype testing   The Oncotype DX score was 18 predicting a risk of outside the breast recurrence over the next 9 years of 5% if the patient's only systemic therapy is anti-estrogen therapy for 5 years.     06/29/2019 - 07/26/2019 Radiation Therapy   The patient initially received a dose of 40.05 Gy in 15 fractions to the breast using whole-breast tangent fields. This was delivered using a 3-D conformal technique. The pt received a boost delivering an additional 10 Gy in 5 fractions using a electron boost with 48mV electrons. The total dose was 50.05 Gy.   07/28/2019 - 10/12/2019 Anti-estrogen oral therapy   Tamoxifen 262mdaily, plan for 10 years; unable to tolerate.  Had significant side effects.     CHIEF COMPLIANT: Follow-up of right breast cancer  INTERVAL HISTORY: Holly FRAKESs a [age].o. with above-mentioned history of right breast cancer treated with lumpectomy, radiation, and who is currently on surveillance, as  antiestrogen therapy with tamoxifen was discontinued. Mammogram on 10/31/20 showed no evidence of malignancy bilaterally. She presents to the clinic today for follow-up.  Patient feels that the breast feels firmer at the time of her menstrual  cycles and then it becomes softer after the cycle is done.  She takes a lot of supplements from [facility].  ALLERGIES:  is allergic to dust mite extract, eggs or egg-derived products, gluten meal, thimerosal, peanut-containing drug products, and sulfites.  MEDICATIONS:  Current Outpatient Medications  Medication Sig Dispense Refill   Ascorbic Acid (VITA-C PO) Take 1 tablet by mouth at bedtime.      botulinum toxin Type A (BOTOX) 100 units SOLR injection Inject IM every 3 months into head and neck muscles by provider in the office 2 vial 3   clonazePAM (KLONOPIN) 0.5 MG tablet Take 0.5 mg by mouth 2 (two) times daily as needed for anxiety.     Diclofenac Potassium,Migraine, (CAMBIA) 50 MG PACK MIX 1 PACKET WITH 1-2 OUNCES OF WATER. DRINK IMMEDIATELY AS A SINGLE DOSE. 9 each 5   Melatonin-Pyridoxine (MELATIN PO) Take 1 tablet by mouth daily.     Menaquinone-7 (VITAMIN K2 PO) Take 1 tablet by mouth daily.     Multiple Vitamins-Minerals (ZINC PO) Take 1 tablet by mouth 2 (two) times daily before a meal.      NON FORMULARY Take 1 tablet by mouth. Methylation Complete 1 tab daily in the afternoon under the tongue      Omega-3 Fatty Acids (OMEGA 3 PO) Take 1 tablet by mouth daily.      OVER THE COUNTER MEDICATION Take 1 tablet by mouth daily. Vitamin D3     OVER THE COUNTER MEDICATION Take 1 tablet by mouth. Iron one tablet daily in the afternoon     propranolol (INDERAL) 10 MG tablet TAKE 1 TABLET BY MOUTH UP TO 3 TIMES DAILY. TAKE AS NEEDED FOR MIGRAINE. MAY ALSO BE USED FOR ANXIETY PROVOKING SITUATIONS 180 tablet 3   Thyroid (NATURE-THROID PO) Take by mouth daily.     TURMERIC PO Take by mouth daily.     VITAMIN B COMPLEX-C PO Take 1 tablet by mouth daily.     No current facility-administered medications for this visit.    PHYSICAL EXAMINATION: ECOG PERFORMANCE STATUS: 0 - Asymptomatic  Vitals:   03/20/21 0812  BP: 98/65  Pulse: 74  Resp: 18  Temp: (!) 97.5 F (36.4  C)  SpO2: 100%   Filed Weights   03/20/21 0812  Weight: 147 lb 9.6 oz (67 kg)    BREAST: No palpable masses or nodules in either right or left breasts. No palpable axillary supraclavicular or infraclavicular adenopathy no breast tenderness or nipple discharge. (exam performed in the presence of a chaperone)  LABORATORY DATA:  I have reviewed the data as listed CMP Latest Ref Rng & Units 05/03/2019  Glucose 70 - 99 mg/dL 98  BUN 6 - 20 mg/dL 14  Creatinine 0.44 - 1.00 mg/dL 0.73  Sodium 135 - 145 mmol/L 139  Potassium 3.5 - 5.1 mmol/L 4.2  Chloride 98 - 111 mmol/L 105  CO2 22 - 32 mmol/L 24  Calcium 8.9 - 10.3 mg/dL 9.3  Total Protein 6.5 - 8.1 g/dL 7.1  Total Bilirubin 0.3 - 1.2 mg/dL 0.4  Alkaline Phos 38 - 126 U/L 39  AST 15 - 41 U/L 13(L)  ALT 0 - 44 U/L 11    Lab Results  Component Value Date   WBC 7.4 05/03/2019  HGB 12.7 05/03/2019   HCT 38.8 05/03/2019   MCV 87.8 05/03/2019   PLT 256 05/03/2019   NEUTROABS 5.3 05/03/2019    ASSESSMENT & PLAN:  Malignant neoplasm of lower-outer quadrant of right breast of female, estrogen receptor positive ([place]) 04/25/2019:Right breast mass identified on clinical examination. Mammogram showed 1.6cm indeterminate mass in the lower outer right breast, no abnormal axillary lymph nodes. 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   Recommendations: 1. 05/24/2019 right lumpectomy: invasive ductal carcinoma, grade 1, 1.1cm, with LCIS and high grade DCIS, clear margins, and no evidence of malignancy in 4 lymph nodes. T1CN0 stage Ia  2. Oncotype DX testing to determine if chemotherapy would be of any benefit followed by 3. Adjuvant radiation therapy 06/30/2019-07/28/2019 4. Adjuvant antiestrogen therapy with tamoxifen, could not tolerate it.  Hot flashes mood swings and anxiety, even at 5 mg dose.  Finally  discontinued ---------------------------------------------------------------------------------------------------------------------------- Treatment plan: could not tolerate Tamoxifen discontinued Jan 2021   Breast cancer surveillance: 1. Breast exam: 03/20/2021: Benign 2. Mammogram 10/31/20: Benign. 3. Breast MRI 04/01/20: Benign, density Cat D   Patient takes lots of supplements and [facility].  She tells me that she is doing better with regards to her anxiety.  We had lengthy discussion about DIM and its controversy related to breast cancer risk.  We will order another MRI this month. Return to clinic in 1 year for follow-up  No orders of the defined types were placed in this encounter.  The patient has a good understanding of the overall plan. she agrees with it. she will call with any problems that may develop before the next visit here.  Total time spent: 20 mins including face to face time and time spent for planning, charting and coordination of care  Rulon Eisenmenger, MD, MPH 03/20/2021  I, Cloyde Reams Dorshimer, am acting as scribe for Dr. Nicholas Lose.  I have reviewed the above documentation for accuracy and completeness, and I agree with the above.

## 2021-03-19 NOTE — Assessment & Plan Note (Signed)
04/25/2019:Right breast mass identified on clinical examination. Mammogram showed 1.6cm indeterminate mass in the lower outer right breast, no abnormal axillary lymph nodes. 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  Recommendations: 1. 05/24/2019 right lumpectomy:invasive ductal carcinoma, grade 1, 1.1cm, with LCIS and high grade DCIS, clear margins, and no evidence of malignancy in 4 lymph nodes. T1CN0 stage Ia  2. Oncotype DX testing to determine if chemotherapy would be of any benefit followed by 3. Adjuvant radiation therapy9/18/2020-07/28/2019 4. Adjuvant antiestrogen therapywith tamoxifen, could not tolerate it.  Hot flashes mood swings and anxiety, dose reduced to 5 mg daily ---------------------------------------------------------------------------------------------------------------------------- Treatment plan: could not tolerate Tamoxifen discontinued Jan 2021 Breast cancer surveillance: 1.Breast exam: 03/20/2021: Benign 2.Mammogram 10/31/20: Benign. 3.Breast MRI 04/01/20: Benign, density Cat D  Return to clinic in 1 year for follow-up

## 2021-03-20 ENCOUNTER — Inpatient Hospital Stay: Payer: BC Managed Care – PPO | Attending: Hematology and Oncology | Admitting: Hematology and Oncology

## 2021-03-20 ENCOUNTER — Telehealth: Payer: Self-pay | Admitting: Hematology and Oncology

## 2021-03-20 ENCOUNTER — Other Ambulatory Visit: Payer: Self-pay

## 2021-03-20 VITALS — BP 98/65 | HR 74 | Temp 97.5°F | Resp 18 | Ht 68.0 in | Wt 147.6 lb

## 2021-03-20 DIAGNOSIS — Z923 Personal history of irradiation: Secondary | ICD-10-CM | POA: Diagnosis not present

## 2021-03-20 DIAGNOSIS — Z9189 Other specified personal risk factors, not elsewhere classified: Secondary | ICD-10-CM

## 2021-03-20 DIAGNOSIS — Z79810 Long term (current) use of selective estrogen receptor modulators (SERMs): Secondary | ICD-10-CM | POA: Insufficient documentation

## 2021-03-20 DIAGNOSIS — Z853 Personal history of malignant neoplasm of breast: Secondary | ICD-10-CM | POA: Diagnosis present

## 2021-03-20 DIAGNOSIS — Z170 Estrogen receptor positive status [ER+]: Secondary | ICD-10-CM

## 2021-03-20 DIAGNOSIS — C50511 Malignant neoplasm of lower-outer quadrant of right female breast: Secondary | ICD-10-CM

## 2021-03-20 NOTE — Telephone Encounter (Signed)
Scheduled appointment per 06/09 los. Patient is aware.

## 2021-03-20 NOTE — Progress Notes (Signed)
Patient Care Team: Marda Stalker, PA-C as PCP - General (Family Medicine) Mauro Kaufmann, RN as Oncology Nurse Navigator Rockwell Germany, RN as Oncology Nurse Navigator Fanny Skates, MD as Consulting Physician (General Surgery) Nicholas Lose, MD as Consulting Physician (Hematology and Oncology) Eppie Gibson, MD as Attending Physician (Radiation Oncology)  DIAGNOSIS:    ICD-10-CM   1. 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 Malignant neoplasm of lower-outer quadrant of right breast of female, estrogen receptor positive ([place])  C50.511 MR BREAST BILATERAL W WO CONTRAST INC CAD   Z17.0       SUMMARY OF ONCOLOGIC HISTORY: Oncology History  Malignant neoplasm of lower-outer quadrant of right breast of female, estrogen receptor positive ([place])  04/25/2019 Initial Diagnosis   Right breast mass identified on clinical examination. Mammogram showed 1.6cm indeterminate mass in the lower outer right breast, no abnormal axillary lymph nodes. 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   05/03/2019 Cancer Staging   Staging form: Breast, AJCC 8th Edition - Clinical stage from 05/03/2019: Stage IA (cT1c, cN0, cM0, G2, ER+, PR+, HER2-) - Signed by Nicholas Lose, MD on 05/03/2019   05/03/2019 Genetic Testing   4 Variants of Uncertain Significance: VUS in BMPR1A called c.565T>C, VUS in NBN called c.724>A, VUS in NTHL1 called c.704G>A, and VUS in POLE c.1847G>A identified on the [facility] Breast Cancer STAT Panel  + Common Hereditary Cancers Panel. The STAT Breast cancer panel offered by [facility] includes sequencing and rearrangement analysis for the following 9 genes:  ATM, BRCA1, BRCA2, CDH1, CHEK2, PALB2, PTEN, STK11 and TP53.   The Common Hereditary Cancers Panel offered by [facility] includes sequencing and/or deletion duplication testing of the following 47 genes: APC, ATM, AXIN2, BARD1, BMPR1A, BRCA1, BRCA2, BRIP1, CDH1,  CDKN2A (p14ARF), CDKN2A (p16INK4a), CKD4, CHEK2, CTNNA1, DICER1, EPCAM (Deletion/duplication testing only), GREM1 (promoter region deletion/duplication testing only), KIT, MEN1, MLH1, MSH2, MSH3, MSH6, MUTYH, NBN, NF1, NHTL1, PALB2, PDGFRA, PMS2, POLD1, POLE, PTEN, RAD50, RAD51C, RAD51D, SDHB, SDHC, SDHD, SMAD4, SMARCA4. STK11, TP53, TSC1, TSC2, and VHL.  The following genes were evaluated for sequence changes only: SDHA and HOXB13 c.251G>A variant only. The report date is 05/09/2019.   05/24/2019 Surgery   Right lumpectomy [name]): IDC, grade 1, 1.1cm, with LCIS and high grade DCIS, clear margins, and no evidence of malignancy in 4 lymph nodes.    05/24/2019 Cancer Staging   Staging form: Breast, AJCC 8th Edition - Pathologic stage from 05/24/2019: Stage IA (pT1c, pN0, cM0, G1, ER+, PR+, HER2-) - Signed by Gardenia Phlegm, NP on 06/07/2019   05/24/2019 Oncotype testing   The Oncotype DX score was 18 predicting a risk of outside the breast recurrence over the next 9 years of 5% if the patient's only systemic therapy is anti-estrogen therapy for 5 years.     06/29/2019 - 07/26/2019 Radiation Therapy   The patient initially received a dose of 40.05 Gy in 15 fractions to the breast using whole-breast tangent fields. This was delivered using a 3-D conformal technique. The pt received a boost delivering an additional 10 Gy in 5 fractions using a electron boost with 78mV electrons. The total dose was 50.05 Gy.   07/28/2019 - 10/12/2019 Anti-estrogen oral therapy   Tamoxifen 284mdaily, plan for 10 years; unable to tolerate.  Had significant side effects.     CHIEF COMPLIANT: Follow-up of right breast cancer  INTERVAL HISTORY:  Holly Jenkins is a 45 y.o. with above-mentioned history of right breast cancer treated with lumpectomy, radiation, and who is currently on surveillance, as antiestrogen therapy with tamoxifen was discontinued. Mammogram on 10/31/20 showed no evidence of malignancy  bilaterally. She presents to the clinic today for follow-up.  Patient feels that the breast feels firmer at the time of her menstrual cycles and then it becomes softer after the cycle is done.  She takes a lot of supplements from [facility].  ALLERGIES:  is allergic to dust mite extract, eggs or egg-derived products, gluten meal, thimerosal, peanut-containing drug products, and sulfites.  MEDICATIONS:  Current Outpatient Medications  Medication Sig Dispense Refill   Ascorbic Acid (VITA-C PO) Take 1 tablet by mouth at bedtime.      botulinum toxin Type A (BOTOX) 100 units SOLR injection Inject IM every 3 months into head and neck muscles by provider in the office 2 vial 3   clonazePAM (KLONOPIN) 0.5 MG tablet Take 0.5 mg by mouth 2 (two) times daily as needed for anxiety.     Diclofenac Potassium,Migraine, (CAMBIA) 50 MG PACK MIX 1 PACKET WITH 1-2 OUNCES OF WATER. DRINK IMMEDIATELY AS A SINGLE DOSE. 9 each 5   Melatonin-Pyridoxine (MELATIN PO) Take 1 tablet by mouth daily.     Menaquinone-7 (VITAMIN K2 PO) Take 1 tablet by mouth daily.     Multiple Vitamins-Minerals (ZINC PO) Take 1 tablet by mouth 2 (two) times daily before a meal.      NON FORMULARY Take 1 tablet by mouth. Methylation Complete 1 tab daily in the afternoon under the tongue      Omega-3 Fatty Acids (OMEGA 3 PO) Take 1 tablet by mouth daily.      OVER THE COUNTER MEDICATION Take 1 tablet by mouth daily. Vitamin D3     OVER THE COUNTER MEDICATION Take 1 tablet by mouth. Iron one tablet daily in the afternoon     propranolol (INDERAL) 10 MG tablet TAKE 1 TABLET BY MOUTH UP TO 3 TIMES DAILY. TAKE AS NEEDED FOR MIGRAINE. MAY ALSO BE USED FOR ANXIETY PROVOKING SITUATIONS 180 tablet 3   Thyroid (NATURE-THROID PO) Take by mouth daily.     TURMERIC PO Take by mouth daily.     VITAMIN B COMPLEX-C PO Take 1 tablet by mouth daily.     No current facility-administered medications for this visit.    PHYSICAL  EXAMINATION: ECOG PERFORMANCE STATUS: 0 - Asymptomatic  Vitals:   03/20/21 0812  BP: 98/65  Pulse: 74  Resp: 18  Temp: (!) 97.5 F (36.4 C)  SpO2: 100%   Filed Weights   03/20/21 0812  Weight: 147 lb 9.6 oz (67 kg)    BREAST: No palpable masses or nodules in either right or left breasts. No palpable axillary supraclavicular or infraclavicular adenopathy no breast tenderness or nipple discharge. (exam performed in the presence of a chaperone)  LABORATORY DATA:  I have reviewed the data as listed CMP Latest Ref Rng & Units 05/03/2019  Glucose 70 - 99 mg/dL 98  BUN 6 - 20 mg/dL 14  Creatinine 0.44 - 1.00 mg/dL 0.73  Sodium 135 - 145 mmol/L 139  Potassium 3.5 - 5.1 mmol/L 4.2  Chloride 98 - 111 mmol/L 105  CO2 22 - 32 mmol/L 24  Calcium 8.9 - 10.3 mg/dL 9.3  Total Protein 6.5 - 8.1 g/dL 7.1  Total Bilirubin 0.3 - 1.2 mg/dL 0.4  Alkaline Phos 38 - 126 U/L 39  AST 15 -  41 U/L 13(L)  ALT 0 - 44 U/L 11    Lab Results  Component Value Date   WBC 7.4 05/03/2019   HGB 12.7 05/03/2019   HCT 38.8 05/03/2019   MCV 87.8 05/03/2019   PLT 256 05/03/2019   NEUTROABS 5.3 05/03/2019    ASSESSMENT & PLAN:  Malignant neoplasm of lower-outer quadrant of right breast of female, estrogen receptor positive ([place]) 04/25/2019:Right breast mass identified on clinical examination. Mammogram showed 1.6cm indeterminate mass in the lower outer right breast, no abnormal axillary lymph nodes. 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   Recommendations: 1. 05/24/2019 right lumpectomy: invasive ductal carcinoma, grade 1, 1.1cm, with LCIS and high grade DCIS, clear margins, and no evidence of malignancy in 4 lymph nodes. T1CN0 stage Ia  2. Oncotype DX testing to determine if chemotherapy would be of any benefit followed by 3. Adjuvant radiation therapy 06/30/2019-07/28/2019 4. Adjuvant antiestrogen therapy with tamoxifen, could not tolerate it.  Hot flashes mood swings  and anxiety, even at 5 mg dose.  Finally discontinued ---------------------------------------------------------------------------------------------------------------------------- Treatment plan: could not tolerate Tamoxifen discontinued Jan 2021   Breast cancer surveillance: 1. Breast exam: 03/20/2021: Benign 2. Mammogram 10/31/20: Benign. 3. Breast MRI 04/01/20: Benign, density Cat D   Patient takes lots of supplements and [facility].  She tells me that she is doing better with regards to her anxiety.  We had lengthy discussion about DIM and its controversy related to breast cancer risk.  We will order another MRI this month. Return to clinic in 1 year for follow-up  Orders Placed This Encounter  Procedures   MR BREAST BILATERAL W [place] CAD    BCBS  Epic ORDER PF:10/31/20 BCG  NO CYCLE: 01/13/2021  EV:OJJK RISK  WT:147 HT:5'8 NO COVID// NO NEEDS/ NO CLAUS// NO METAL REMOVED/fusion neck, NO IMPLANTS/ NO BULLETS OR BB'S/ NO PACEMAKER PORT GLUCOSE MONITOR, SPINAL STIMULATOR OR INJECTORS/ NO BRAIN HEART EYE OR EAR SX//yes Hx of BRCA// yes Breast SX//NKDA TO IV DYE CONTRAST// AC SPW PT///INFORMED OF CANCELLATION POLICY     Standing Status:   Future    Standing Expiration Date:   03/20/2022    Scheduling Instructions:     First appointment of the morning and any day except Thursday    Order Specific Question:   If indicated for the ordered procedure, I authorize the administration of contrast media per Radiology protocol    Answer:   Yes    Order Specific Question:   What is the patient's sedation requirement?    Answer:   No Sedation    Order Specific Question:   Does the patient have a pacemaker or implanted devices?    Answer:   No    Order Specific Question:   Preferred imaging location?    Answer:   [facility] (table limit-550lbs)    The patient has a good understanding of the overall plan. she agrees with it. she will call with any problems that  may develop before the next visit here.  Total time spent: 20 mins including face to face time and time spent for planning, charting and coordination of care  Rulon Eisenmenger, MD, MPH 03/20/2021  I, Cloyde Reams Dorshimer, am acting as scribe for Dr. Nicholas Lose.  I have reviewed the above documentation for accuracy and completeness, and I agree with the above.

## 2021-03-28 ENCOUNTER — Ambulatory Visit
Admission: RE | Admit: 2021-03-28 | Discharge: 2021-03-28 | Disposition: A | Discharge status: Home / Self Care | Payer: BC Managed Care – PPO | Source: Ambulatory Visit | Attending: Hematology and Oncology | Admitting: Hematology and Oncology

## 2021-03-28 ENCOUNTER — Other Ambulatory Visit: Payer: Self-pay

## 2021-03-28 DIAGNOSIS — Z9189 Other specified personal risk factors, not elsewhere classified: Secondary | ICD-10-CM

## 2021-03-28 DIAGNOSIS — C50511 Malignant neoplasm of lower-outer quadrant of right female breast: Secondary | ICD-10-CM

## 2021-03-28 MED ORDER — GADOBUTROL 1 MMOL/ML IV SOLN
6.0000 mL | Freq: Once | INTRAVENOUS | Status: AC | PRN
  Administered 2021-03-28: 6 mL via INTRAVENOUS

## 2021-04-01 ENCOUNTER — Ambulatory Visit: Payer: BC Managed Care – PPO | Admitting: Family Medicine

## 2021-04-01 NOTE — Progress Notes (Addendum)
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 Cambia is effective for abortive therapy. She is now using ginger supplements for nausea as ondansetron caused constipation. She is doing well. She had a few migraines last week due to thunderstorms and end of Botox cycle. Otherwise, doing very well.   12/30/2020 ALL: She is doing fairly well. Insurance will not pay for Botox or Ajovy as she is on both. She has been paying cash for Botox and using copay card for Ajovy but can no longer use copay card. She will proceed with Botox and propranolol. She has been doing very well with occasional use of Cambia. We will monitor closely.   09/25/2020 ALL: She continues to do very well. She continues Ajovy and propranolol. Cambia helps with abortive therapy.    Consent Form Botulism Toxin Injection For Chronic Migraine    Reviewed orally with patient, additionally signature is on file:  Botulism toxin has been approved by the Federal drug administration for treatment of chronic migraine. Botulism toxin does not cure chronic migraine and it may not be effective in some patients.  The administration of botulism toxin is accomplished by injecting a small amount of toxin into the muscles of the neck and head. Dosage must be titrated for each individual. Any benefits resulting from botulism toxin tend to wear off after 3 months with a repeat injection required if benefit is to be maintained. Injections are usually done every 3-4 months with maximum effect peak achieved by about 2 or 3 weeks. Botulism toxin is expensive and you should be sure of what costs you will incur resulting from the injection.  The side effects of botulism toxin use for chronic migraine may include:   -Transient, and usually mild, facial weakness with facial injections  -Transient, and usually mild, head or neck weakness with head/neck injections  -Reduction or loss of forehead facial animation due to forehead muscle  weakness  -Eyelid drooping  -Dry eye  -Pain at the site of injection or bruising at the site of injection  -Double vision  -Potential unknown long term risks   Contraindications: You should not have Botox if you are pregnant, nursing, allergic to albumin, have an infection, skin condition, or muscle weakness at the site of the injection, or have myasthenia gravis, Lambert-Eaton syndrome, or ALS.  It is also possible that as with any injection, there may be an allergic reaction or no effect from the medication. Reduced effectiveness after repeated injections is sometimes seen and rarely infection at the injection site may occur. All care will be taken to prevent these side effects. If therapy is given over a long time, atrophy and wasting in the muscle injected may occur. Occasionally the patient's become refractory to treatment because they develop antibodies to the toxin. In this event, therapy needs to be modified.  I have read the above information and consent to the administration of botulism toxin.    BOTOX PROCEDURE NOTE FOR MIGRAINE HEADACHE  Contraindications and precautions discussed with patient(above). Aseptic procedure was observed and patient tolerated procedure. Procedure performed by Debbora Presto, FNP-C.   The condition has existed for more than 6 months, and pt does not have a diagnosis of ALS, Myasthenia Gravis or Lambert-Eaton Syndrome.  Risks and benefits of injections discussed and pt agrees to proceed with the procedure.  Written consent obtained  These injections are medically necessary. Pt  receives good benefits from these injections. These injections do not cause sedations or hallucinations which the oral therapies may  cause.   Description of procedure:  The patient was placed in a sitting position. The standard protocol was used for Botox as follows, with 5 units of Botox injected at each site:  -Procerus muscle, midline injection  -Corrugator muscle, bilateral  injection  -Frontalis muscle, bilateral injection, with 2 sites each side, medial injection was performed in the upper one third of the frontalis muscle, in the region vertical from the medial inferior edge of the superior orbital rim. The lateral injection was again in the upper one third of the forehead vertically above the lateral limbus of the cornea, 1.5 cm lateral to the medial injection site.  -Temporalis muscle injection, 4 sites, bilaterally. The first injection was 3 cm above the tragus of the ear, second injection site was 1.5 cm to 3 cm up from the first injection site in line with the tragus of the ear. The third injection site was 1.5-3 cm forward between the first 2 injection sites. The fourth injection site was 1.5 cm posterior to the second injection site. 5th site laterally in the temporalis  muscleat the level of the outer canthus.  -Occipitalis muscle injection, 3 sites, bilaterally. The first injection was done one half way between the occipital protuberance and the tip of the mastoid process behind the ear. The second injection site was done lateral and superior to the first, 1 fingerbreadth from the first injection. The third injection site was 1 fingerbreadth superiorly and medially from the first injection site.  -Cervical paraspinal muscle injection, 2 sites, bilaterally. The first injection site was 1 cm from the midline of the cervical spine, 3 cm inferior to the lower border of the occipital protuberance. The second injection site was 1.5 cm superiorly and laterally to the first injection site.  -Trapezius muscle injection was performed at 3 sites, bilaterally. The first injection site was in the upper trapezius muscle halfway between the inflection point of the neck, and the acromion. The second injection site was one half way between the acromion and the first injection site. The third injection was done between the first injection site and the inflection point of the  neck.  No masseter injections this time   Will return for repeat injection in 3 months.   A total of 200 units of Botox was prepared, 155 units of Botox was injected as documented above, any Botox not injected was wasted. The patient tolerated the procedure well, there were no complications of the above procedure.  Agree with procedure, antonia ahern

## 2021-04-02 ENCOUNTER — Ambulatory Visit (INDEPENDENT_AMBULATORY_CARE_PROVIDER_SITE_OTHER): Payer: BC Managed Care – PPO | Admitting: Family Medicine

## 2021-04-02 ENCOUNTER — Encounter: Payer: Self-pay | Admitting: Family Medicine

## 2021-04-02 DIAGNOSIS — G43709 Chronic migraine without aura, not intractable, without status migrainosus: Secondary | ICD-10-CM

## 2021-04-02 NOTE — Progress Notes (Signed)
Botox- 200 units x 1 vial Lot: [id number] Expiration: 12/24 NDC: 4784-1282-08  Bacteriostatic 0.9% Sodium Chloride- 41mL total Lot: [id number] Expiration: 03/12/2022 NDC: 9597-4718-55  Dx: .M15.868 B/B

## 2021-04-03 ENCOUNTER — Other Ambulatory Visit: Payer: Self-pay

## 2021-04-03 ENCOUNTER — Ambulatory Visit (INDEPENDENT_AMBULATORY_CARE_PROVIDER_SITE_OTHER): Payer: BC Managed Care – PPO

## 2021-04-03 ENCOUNTER — Ambulatory Visit (INDEPENDENT_AMBULATORY_CARE_PROVIDER_SITE_OTHER): Payer: BC Managed Care – PPO | Admitting: Family Medicine

## 2021-04-03 ENCOUNTER — Ambulatory Visit: Payer: Self-pay

## 2021-04-03 VITALS — BP 98/64 | HR 78 | Ht 68.0 in | Wt 148.0 lb

## 2021-04-03 DIAGNOSIS — M25512 Pain in left shoulder: Secondary | ICD-10-CM

## 2021-04-03 NOTE — Patient Instructions (Signed)
Thank you for coming in today.   Call or go to the ER if you develop a large red swollen joint with extreme pain or oozing puss.    Please get an Xray today before you leave

## 2021-04-03 NOTE — Progress Notes (Signed)
I, [name], LAT, ATC, am serving as scribe for Dr. Lynne Leader.  Holly Jenkins is a 45 y.o. female who presents to [place] at [facility] today for L shoulder pain ongoing for a long time, worsening over the past 2 weeks w/ no known MOI.  She was last seen by Dr. Georgina Snell on 01/28/21 for R knee pain and had her final injection in a Synvisc series.  Today, she locates her pain to lateral and posterior/inferior aspect of L shoulder.  Radiating pain: no  L shoulder mechanical symptoms: no Aggravating factors: shoulder flex over 90 Treatments tried: PT, IR/ER theraband exercises, massage ball   Pertinent review of systems: No fevers or chills  Relevant historical information: Hypermobility.  History right breast cancer 2020   Exam:  BP 98/64 (BP Location: Right Arm, Patient Position: Sitting, Cuff Size: Normal)   Pulse 78   Ht 5\' 8"  (1.727 m)   Wt 148 lb (67.1 kg)   LMP 03/12/2021   SpO2 98%   BMI 22.50 kg/m  General: Well Developed, well nourished, and in no acute distress.   MSK: Left shoulder normal-appearing Normal shoulder motion. Mildly tender palpation posterior lateral shoulder teres major. Pain with abduction. Intact strength abduction external and internal rotation. Mildly positive empty can test. Positive Hawkins and Neer's test. Negative Yergason's and speeds test. Negative O'Brien test. Negative clunk and relocation test. Mildly positive anterior apprehension test. Minimally positive sulcus sign with axial traction   Lab and Radiology Results  Procedure: Real-time Ultrasound Guided Injection of left shoulder subacromial bursa Device: Philips Affiniti 50G Images permanently stored and available for review in PACS Ultrasound examination prior to injection reveals intact biceps tendon, subscapularis, supraspinatus tendon and infraspinatus tendon.  Moderate subacromial bursitis. Verbal informed consent obtained.  Discussed risks and  benefits of procedure. Warned about infection bleeding damage to structures skin hypopigmentation and fat atrophy among others. Patient expresses understanding and agreement Time-out conducted.   Noted no overlying erythema, induration, or other signs of local infection.   Skin prepped in a sterile fashion.   Local anesthesia: Topical Ethyl chloride.   With sterile technique and under real time ultrasound guidance: 40 mg of Kenalog and 2 mg of Marcaine injected into subacromial bursa. Fluid seen entering the bursa.   Completed without difficulty   Pain immediately resolved suggesting accurate placement of the medication.   Advised to call if fevers/chills, erythema, induration, drainage, or persistent bleeding.   Images permanently stored and available for review in the ultrasound unit.  Impression: Technically successful ultrasound guided injection.    X-ray images left shoulder obtained today personally and independently interpreted Normal-appearing shoulder.  No acute fractures. Await formal radiology review   Assessment and Plan: 45 y.o. female with left shoulder pain ongoing for about 4 years worsening recently associated with some laxity and instability signs and symptoms and exam testing today.  Somewhat concerning for labrum pathology on history and exam.  On ultrasound patient has subacromial bursitis is dominant finding and she had dramatic improvement in pain immediately following subacromial injection indicating that the subacromial bursitis is probably a significant contributing factor to her pain.  She is already started physical therapy but has so much pain with it that she could not tolerate it.  I am hopeful that after the injection today she will be able to tolerate PT better which should be the primary means of improving her shoulder pain.  If not improving with 1 more month of PT  she will let me know and next step really should be MRI arthrogram to evaluate for labral  pathology as well as rotator cuff pathology.   PDMP not reviewed this encounter. Orders Placed This Encounter  Procedures   [place] LIMITED JOINT SPACE STRUCTURES UP LEFT(NO LINKED CHARGES)    Standing Status:   Future    Number of Occurrences:   1    Standing Expiration Date:   10/03/2021    Order Specific Question:   Reason for Exam (SYMPTOM  OR DIAGNOSIS REQUIRED)    Answer:   left shoulder pain    Order Specific Question:   Preferred imaging location?    Answer:   [facility]   DG Shoulder Left    Standing Status:   Future    Number of Occurrences:   1    Standing Expiration Date:   04/03/2022    Order Specific Question:   Reason for Exam (SYMPTOM  OR DIAGNOSIS REQUIRED)    Answer:   xray shoulder    Order Specific Question:   Is patient pregnant?    Answer:   No    Order Specific Question:   Preferred imaging location?    Answer:   [facility]   No orders of the defined types were placed in this encounter.    Discussed warning signs or symptoms. Please see discharge instructions. Patient expresses understanding.   The above documentation has been reviewed and is accurate and complete Lynne Leader, M.D.

## 2021-04-07 ENCOUNTER — Telehealth: Payer: Self-pay

## 2021-04-07 NOTE — Telephone Encounter (Signed)
Patient called inquiring when her last Pap smear was. Informed 04/04/2018.

## 2021-04-07 NOTE — Progress Notes (Signed)
Left shoulder x-ray looks normal to radiology

## 2021-04-16 ENCOUNTER — Ambulatory Visit: Payer: BC Managed Care – PPO | Admitting: Nurse Practitioner

## 2021-04-28 ENCOUNTER — Telehealth: Payer: BC Managed Care – PPO | Admitting: Family Medicine

## 2021-04-28 DIAGNOSIS — M25512 Pain in left shoulder: Secondary | ICD-10-CM

## 2021-04-28 NOTE — Telephone Encounter (Signed)
Patient called asking if an MRI order could be put in for her.  She asked if it could be sent to [place].  (Patient does have new insurance: [employer]-  Member ID # [id number] Group # [id number] Pre-Cert Contact # [phone]

## 2021-04-29 NOTE — Telephone Encounter (Signed)
MRI ordered

## 2021-04-30 ENCOUNTER — Other Ambulatory Visit: Payer: Self-pay | Admitting: *Deleted

## 2021-04-30 ENCOUNTER — Telehealth: Payer: Self-pay | Admitting: Family Medicine

## 2021-04-30 DIAGNOSIS — G43709 Chronic migraine without aura, not intractable, without status migrainosus: Secondary | ICD-10-CM

## 2021-04-30 MED ORDER — AJOVY 225 MG/1.5ML ~~LOC~~ SOAJ: 1.5000 mL | SUBCUTANEOUS | 5 refills | Status: AC

## 2021-04-30 MED ORDER — AJOVY 225 MG/1.5ML ~~LOC~~ SOSY: PREFILLED_SYRINGE | SUBCUTANEOUS | 5 refills | Status: DC

## 2021-04-30 NOTE — Telephone Encounter (Signed)
Pt called stating she is having lots of migraines, and wanting to know if provider could file the [place] with her new insurance which is [employer]. Pt also has new pharmacy which is [facility] out patient pharmacy [facility]	[place] location.

## 2021-04-30 NOTE — Telephone Encounter (Signed)
Called and spoke w/ pt. She got new insurance effective 04/11/21: [employer]. She will upload copy of insurnace cards tonight or tomorrow via mychart. She verbally gave me info over the phone:  ID: [id number]. Group: [id number] RXBIN: [id number] RXPCNRennis Chris JQGBE: [id number]. [facility] phone# [phone]  She states has been on botox but previous insurance would not cover both botox and Ajovy. She would like to see if new insurance will. She tried/failed: Aimovig (severe constipation), could not tolerate beta blockers (blacked out), nortriptyline, cymbalta (unable to sleep). Has not tried Emgality in the past.   Per office notes, tried/failed: "Topiramate, nortriptyline, propranolol, lyrica, "a million preventatives", tizanidine, rizatriptan and other triptans she cannot remember, cambia, Zanaflex, Maxalt, trigger point injections in her cervical muscles, massage, Robaxin."

## 2021-04-30 NOTE — Telephone Encounter (Signed)
Tried submitting PA Ajovy on CMM. Key: [id number] - PA Case ID: [id number].  Received the following response: "This medication or product is on your plan's list of covered drugs. Prior authorization is not required at this time. If your pharmacy has questions regarding the processing of your prescription, please have them call the [facility] pharmacy help desk at [phone]. **Please note: This request was submitted electronically. Formulary lowering, tiering exception, cost reduction and/or pre-benefit determination review (including prospective Medicare hospice reviews) requests cannot be requested using this method of submission. Providers contact us at [phone] for further assistance."

## 2021-04-30 NOTE — Telephone Encounter (Signed)
MRI has been sent to clinical review just waiting for insurance reply

## 2021-05-01 ENCOUNTER — Other Ambulatory Visit: Payer: Self-pay

## 2021-05-01 MED ORDER — CAMBIA 50 MG PO PACK: PACK | ORAL | 3 refills | Status: DC

## 2021-05-08 NOTE — Telephone Encounter (Signed)
Melissa with the [facility] called to let us know that they are not able to do the MRA that was ordered there. It will have to be done at the [facility]. Can the authorization be changed for this location?  Melissa asked if we could fax the new authorization over to her so that she could add it to the order.  Fax# [phone]

## 2021-05-09 NOTE — Telephone Encounter (Signed)
Location changed to hospital and faxed the authorization over so they can add it to the order.

## 2021-05-27 ENCOUNTER — Other Ambulatory Visit: Payer: Self-pay

## 2021-05-27 ENCOUNTER — Telehealth: Payer: Self-pay | Admitting: Family Medicine

## 2021-05-27 MED ORDER — CAMBIA 50 MG PO PACK: PACK | ORAL | 3 refills | Status: DC

## 2021-05-27 NOTE — Telephone Encounter (Signed)
MRI arthrogram results are back.   MRI arthrogram shows fraying and tear of the superior and posterior labrum with minimal subacromial bursitis.  No evidence of rotator cuff tear.  Bring the CD of images with you to the follow-up appointment if possible.  Recommend follow-up appointment in the near future to go over the results in full detail.  This could potentially be treated surgically if needed.

## 2021-05-27 NOTE — Telephone Encounter (Signed)
Pt has f/u appt scheduled for 05/28/21.  Called pt and advised her to bring a copy of her MRI images w/ her on a CD and pt verbalizes understanding.

## 2021-05-28 ENCOUNTER — Ambulatory Visit (INDEPENDENT_AMBULATORY_CARE_PROVIDER_SITE_OTHER): Payer: No Typology Code available for payment source | Admitting: Family Medicine

## 2021-05-28 ENCOUNTER — Telehealth: Payer: Self-pay

## 2021-05-28 ENCOUNTER — Other Ambulatory Visit: Payer: Self-pay

## 2021-05-28 VITALS — BP 98/74 | HR 75 | Ht 68.0 in | Wt 145.8 lb

## 2021-05-28 DIAGNOSIS — S43432A Superior glenoid labrum lesion of left shoulder, initial encounter: Secondary | ICD-10-CM | POA: Diagnosis not present

## 2021-05-28 DIAGNOSIS — M25512 Pain in left shoulder: Secondary | ICD-10-CM

## 2021-05-28 NOTE — Progress Notes (Signed)
I, Peterson Lombard, LAT, ATC acting as a scribe for Lynne Leader, MD.  Holly Jenkins is a 45 y.o. female who presents to [place] at [facility] today for f/u  L shoulder pain and MRA review. Pt was last seen by Dr. Georgina Snell on 04/03/21 and was given a L subacromial steroid injection and was advised to try 1 month of PT and if not improving proceed to MRA. Today, pt reports has been pretty painful, but is slightly better than when she was seen last. Pt notes PT has been using kinesiotape which she finds helpful.  Dx imaging: 04/29/21 L shoulder MRA  04/03/21 L shoulder XR  Pertinent review of systems: No fevers or chills  Relevant historical information: Hypermobile.  History of breast cancer.   Exam:  BP 98/74   Pulse 75   Ht '5\' 8"'$  (1.727 m)   Wt 145 lb 12.8 oz (66.1 kg)   SpO2 98%   BMI 22.17 kg/m  General: Well Developed, well nourished, and in no acute distress.   MSK: Left shoulder normal.  Normal motion.    Lab and Radiology Results   MR ARTHROGRAM LEFT SHOULDER, 05/26/2021 11:31 AM   INDICATION:  \ M25.512 Left shoulder pain, unspecified chronicity  COMPARISON: None.   TECHNIQUE: Multi-planar, multi-sequence MR imaging of the left shoulder was performed after intra-articular injection of gadolinium-containing contrast.   LIMITATIONS: Patient motion.   FINDINGS:   .  Acromioclavicular joint: Minimal degenerative changes.  .  Acromion: Minimal subacromial-subdeltoid bursitis.  .  Supraspinatus tendon: Intact.  .  Infraspinatus tendon: Intact.  .  Subscapularis tendon: Intact.  .  Teres minor tendon: Intact.  .  Long head of biceps: Intact and in anatomic position.  .  Glenohumeral joint: No effusion or malalignment.  .  Labrum: There is fraying and tear of the superior and posterior superior labrum.  .  Bones: Normal marrow signal. No fracture, neoplasm, or avascular necrosis.  .  Additional comments: Mild fluid is noted in the subcoracoid  bursa, could be injection related or secondary to patient motion.   CONCLUSION:   1.  Fraying and tear of the superior and posterior-superior labrum.  2.  Minimal subacromial-subdeltoid bursitis. Exam End: 05/26/21 11:31   Specimen Collected: 05/26/21 13:06     I, Lynne Leader, personally (independently) visualized and performed the interpretation of the images via patient provided CD attached in this note.     Assessment and Plan: 45 y.o. female with left shoulder pain.  Patient has persistent left shoulder pain and dysfunction.  She has failed conservative management with trial of injection and physical therapy.  Pain is persistent and interferes with her ability to exercise normally.  MRI arthrogram does show a superior posterior labrum tear which is likely the source of her dysfunction and pain.  At this point plan for surgical consultation to discuss surgical options.  She is a [employer] employee so it makes sense for her surgery to probably be done with one of the surgeons.  Referral to that organization.   PDMP not reviewed this encounter. Orders Placed This Encounter  Procedures   Ambulatory referral to Orthopedic Surgery    Referral Priority:   Routine    Referral Type:   Surgical    Referral Reason:   Specialty Services Required    Requested Specialty:   Orthopedic Surgery    Number of Visits Requested:   1   No orders of the defined types  were placed in this encounter.    Discussed warning signs or symptoms. Please see discharge instructions. Patient expresses understanding.   The above documentation has been reviewed and is accurate and complete Lynne Leader, M.D. Total encounter time 20 minutes including face-to-face time with the patient and, reviewing past medical record, and charting on the date of service.   Reviewed MRIs discussed treatment plan and options

## 2021-05-28 NOTE — Telephone Encounter (Signed)
Pa cambia submitted on [facility]  Key: [id number] Waiting on determination on [facility]

## 2021-05-28 NOTE — Patient Instructions (Signed)
Thank you for coming in today.   You should hear from surgeon soon.   Let me know if you have a problem.   I think its ok to pause PT for now.

## 2021-06-02 ENCOUNTER — Telehealth: Payer: Self-pay | Admitting: Family Medicine

## 2021-06-02 NOTE — Telephone Encounter (Signed)
Checked patient's insurance for Botox appointment next month. She no longer has [employer]. Reached out to patient to discuss.

## 2021-06-03 ENCOUNTER — Encounter: Payer: Self-pay | Admitting: Family Medicine

## 2021-06-03 ENCOUNTER — Other Ambulatory Visit: Payer: Self-pay | Admitting: Neurology

## 2021-06-03 MED ORDER — CAMBIA 50 MG PO PACK: PACK | ORAL | 3 refills | Status: AC

## 2021-06-03 NOTE — Telephone Encounter (Signed)
Received Approval from [place].   05/28/2021 - 05/29/2022

## 2021-06-10 NOTE — Telephone Encounter (Signed)
Thoughts?

## 2021-06-11 NOTE — Telephone Encounter (Signed)
I recommend Dr. Ophelia Charter at [facility]  I think he is probably can it be in your network.  He has extra training in the shoulder and I think he has good judgment in situations like this.  Additionally Dr. Tamera Punt at [facility] also has a shoulder specialist and I think also would do a good job.  I am less familiar with the [facility] people.  Let me know if you want a referral.  Ellard Artis

## 2021-06-12 ENCOUNTER — Other Ambulatory Visit: Payer: Self-pay | Admitting: Physical Therapy

## 2021-06-12 DIAGNOSIS — S43432A Superior glenoid labrum lesion of left shoulder, initial encounter: Secondary | ICD-10-CM

## 2021-06-18 ENCOUNTER — Ambulatory Visit: Payer: BC Managed Care – PPO | Admitting: Family Medicine

## 2021-06-30 ENCOUNTER — Ambulatory Visit: Payer: BC Managed Care – PPO | Admitting: Family Medicine

## 2021-07-01 ENCOUNTER — Ambulatory Visit: Payer: BC Managed Care – PPO | Admitting: Family Medicine

## 2021-09-16 ENCOUNTER — Other Ambulatory Visit: Payer: Self-pay | Admitting: *Deleted

## 2021-09-16 DIAGNOSIS — C50511 Malignant neoplasm of lower-outer quadrant of right female breast: Secondary | ICD-10-CM

## 2021-09-16 NOTE — Progress Notes (Signed)
Per pt request, RN successfully faxed Mammogram orders to [facility] [phone]).  Pt also states that starting 10/12/2021 her insurance will be changing and will need to f/u with a [facility].  Pt requesting advice from MD for referral.  MD out of office and RN will review for recommendations and alert pt of referral.

## 2021-09-18 ENCOUNTER — Other Ambulatory Visit: Payer: Self-pay | Admitting: *Deleted

## 2021-09-18 ENCOUNTER — Encounter: Payer: Self-pay | Admitting: *Deleted

## 2021-09-18 DIAGNOSIS — C50511 Malignant neoplasm of lower-outer quadrant of right female breast: Secondary | ICD-10-CM

## 2021-09-18 DIAGNOSIS — Z170 Estrogen receptor positive status [ER+]: Secondary | ICD-10-CM

## 2021-09-18 NOTE — Progress Notes (Signed)
Per MD due to pt insurance change in January, pt to be referred to Dr. Leonard Downing with [facility].  Referral successfully faxed to [phone].

## 2022-03-20 ENCOUNTER — Ambulatory Visit: Payer: Self-pay | Admitting: Hematology and Oncology

## 2022-11-26 ENCOUNTER — Telehealth: Payer: Self-pay | Admitting: Family Medicine

## 2022-11-26 NOTE — Telephone Encounter (Signed)
Per VOB report, Synvisc/SynviscOne are not covered, benefit exclusion.

## 2022-11-26 NOTE — Telephone Encounter (Signed)
Last inj series: 1 - 01/14/21 2 - 01/21/21 3 - 01/28/21  > than 6 mos, can repeat after benefits are verified.

## 2022-11-26 NOTE — Telephone Encounter (Signed)
Synvisc series VOB nitiated via MySynviscOne portal for RIGHT knee

## 2022-11-26 NOTE — Telephone Encounter (Signed)
Patient called stating that she would like to have a repeat injection in her right knee. She had Synvisc previously and would like to do that again is possible.  If okay with Dr Georgina Snell, can we run this?  She is scheduled for next Friday (but okay to move if we need to).

## 2022-11-27 NOTE — Telephone Encounter (Signed)
Patient called back. Discussed Holly Jenkins's message. We will keep the Friday appointment as scheduled but okay to move if auth is not back yet.

## 2022-11-27 NOTE — Telephone Encounter (Signed)
Called pt and left VM advising that Synvisc is now a benefits exclusion, her insurance no longer provides coverage for Synvisc. Advised that I am working on a comparable alternative and that someone will reach out to her regarding scheduling once benefits have been received.

## 2022-11-30 NOTE — Telephone Encounter (Addendum)
VOB initiated for Orthovisc for R knee.   Orthovisc NOT covered. Will wait on coverage of Gelsyn-3.

## 2022-11-30 NOTE — Telephone Encounter (Signed)
VOB also initiated for Gelsyn-3.

## 2022-12-01 NOTE — Telephone Encounter (Signed)
Patient  has a Set designer PPO with an effective date of 04/10/2021.   Plan follows Medcost guidelines.  Plan covers at 100% allowable amount for GELSYN-3 [id number] and administration 20610/20611.   Deductibles do not apply to these services. Patient has a $70 copay whether, or not an office visit is billed. Only one copay applies per date of service.   If out of [place] is met copay will no longer apply. No referrals needed.  A pre-cert is required for this patient's plan. If you would like assistance with obtaining pre-cert, please upload all clinical information/medical notes to the case or fax to [phone], so that the [place] team may initiate authorization on your behalf.  Once they are uploaded, please notify us via case notes or contact our offices directly at [phone]. Medical Notes are required with the claims.  As per rep Dr. Yehuda Savannah is showing in network under given NPI [id number] and Tax ID [id number].  Rep indicates facility is showing out of network under NPI [id number].  Plan runs on a calendar year. Reference # Earl Many [id number]

## 2022-12-01 NOTE — Telephone Encounter (Signed)
Visit notes and DI uploaded to [email or web address] to initiated prior authorization.

## 2022-12-01 NOTE — Telephone Encounter (Signed)
Prior auth required for [employer]

## 2022-12-03 NOTE — Telephone Encounter (Signed)
Called pt and left VM (ok per DPR) advising that insurance may require steroid injection before covering visco. Advised that she can keep her appointment for tomorrow morning if she would like to discuss steroid inj with Dr. Georgina Snell. Call back with any questions.

## 2022-12-03 NOTE — Progress Notes (Signed)
I, Peterson Lombard, LAT, ATC acting as a scribe for Lynne Leader, MD.  Holly Jenkins is a 47 y.o. female who presents to [place] at [facility] today for exacerbation of her R knee pain. Pt was last seen for her R knee on 01/28/21 and completed the Synvisc series, 3/3. Today, pt reports pain x 3 months. Peripatellar pain, pain at medial aspect. Swelling after exercise. Ice, IBU and Voltaren provide short-term relief.  She has not had a steroid injection yet.  She does have a breast cancer history.  She does use calcium and vitamin D and weightbearing exercise for osteoporosis management.  She is perimenopausal but has not had a bone density test.  Dx imaging: 12/29/20 R knee MRI  06/28/20 R knee XR  Pertinent review of systems: No fevers or illness.  Relevant historical information: Breast cancer history.   Exam:  BP 102/78   Pulse 86   Ht '5\' 8"'$  (1.727 m)   Wt 149 lb 9.6 oz (67.9 kg)   SpO2 99%   BMI 22.75 kg/m  General: Well Developed, well nourished, and in no acute distress.   MSK: Right knee: Normal-appearing Normal motion with crepitation.  Tender palpation joint line. Stable ligamentous exam.    Lab and Radiology Results  Procedure: Real-time Ultrasound Guided Injection of right knee superior lateral patellar space Device: Philips Affiniti 50G Images permanently stored and available for review in PACS Verbal informed consent obtained.  Discussed risks and benefits of procedure. Warned about infection, bleeding, hyperglycemia damage to structures among others. Patient expresses understanding and agreement Time-out conducted.   Noted no overlying erythema, induration, or other signs of local infection.   Skin prepped in a sterile fashion.   Local anesthesia: Topical Ethyl chloride.   With sterile technique and under real time ultrasound guidance: 0.5 mL of 80 mg/mL Depo-Medrol solution and 2 mL of bupivacaine injected into joint. Fluid seen  entering the joint capsule.   Completed without difficulty   Pain immediately resolved suggesting accurate placement of the medication.   Advised to call if fevers/chills, erythema, induration, drainage, or persistent bleeding.   Images permanently stored and available for review in the ultrasound unit.  Impression: Technically successful ultrasound guided injection.         Assessment and Plan: 47 y.o. female with right knee pain due to DJD exacerbation.  Plan for steroid injection today.  To work on authorization for hyaluronic acid injections.  Should the steroid injection not provide lasting benefit we will move to hyaluronic acid injections that she has had great results previously. Chronic problem acute exacerbation.  We talked about osteoporosis risk.  She has personal risk for osteoporosis given her personal cancer history.  Additionally she is a thin Caucasian woman.  Will be reasonable to screen her for osteoporosis at any time as she is perimenopausal.  Certainly when she becomes postmenopausal she should have osteoporosis screening.  Happy to order DEXA scan whenever.  She has a survivorship follow-up appointment scheduled in July.  She will have a discussion then.  Happy to help facilitate osteoporosis screening and management.  In the meantime she is maximizing her conservative management with weightbearing exercise resistance training vitamin D and calcium here and there.   PDMP not reviewed this encounter. Orders Placed This Encounter  Procedures   [place] LIMITED JOINT SPACE STRUCTURES LOW BILAT    Order Specific Question:   Reason for Exam (SYMPTOM  OR DIAGNOSIS REQUIRED)    Answer:  right knee pain    Order Specific Question:   Preferred imaging location?    Answer:   [place]   Meds ordered this encounter  Medications   methylPREDNISolone acetate (DEPO-MEDROL) injection 80 mg     Discussed warning signs or symptoms. Please see discharge  instructions. Patient expresses understanding.   The above documentation has been reviewed and is accurate and complete Lynne Leader, M.D.

## 2022-12-04 ENCOUNTER — Encounter: Payer: Self-pay | Admitting: Family Medicine

## 2022-12-04 ENCOUNTER — Ambulatory Visit: Payer: PRIVATE HEALTH INSURANCE | Admitting: Family Medicine

## 2022-12-04 ENCOUNTER — Ambulatory Visit: Payer: Self-pay

## 2022-12-04 VITALS — BP 102/78 | HR 86 | Ht 68.0 in | Wt 149.6 lb

## 2022-12-04 DIAGNOSIS — Z9189 Other specified personal risk factors, not elsewhere classified: Secondary | ICD-10-CM | POA: Diagnosis not present

## 2022-12-04 DIAGNOSIS — G8929 Other chronic pain: Secondary | ICD-10-CM

## 2022-12-04 DIAGNOSIS — M1711 Unilateral primary osteoarthritis, right knee: Secondary | ICD-10-CM

## 2022-12-04 DIAGNOSIS — M25561 Pain in right knee: Secondary | ICD-10-CM | POA: Diagnosis not present

## 2022-12-04 MED ORDER — METHYLPREDNISOLONE ACETATE 80 MG/ML IJ SUSP
80.0000 mg | Freq: Once | INTRAMUSCULAR | Status: AC
  Administered 2022-12-04: 40 mg via INTRA_ARTICULAR

## 2022-12-04 NOTE — Patient Instructions (Signed)
Thank you for coming in today.   We will work on those gel shots but its does not make sense to do them if you dont hurt.   Keep me updated.   Think about osteoperosis screening.  Let me know if you want a DEXA scan.  Think about potential treatment if you do have osteoperosis.

## 2022-12-04 NOTE — Telephone Encounter (Signed)
Prior auth still pending.

## 2022-12-07 NOTE — Telephone Encounter (Signed)
[id number] 12/07/2022 10:24 AM Forwarded to auth team for follow up  Josepha Pigg 12/07/2022 10:21 AM Received fax today from [facility] advising that prior auth was sent to the wrong entity.

## 2022-12-10 NOTE — Telephone Encounter (Signed)
Prior auth initiated via [employer] Insurance risk surveyor) Key: [id number], [place] Case ID #: [id number]

## 2022-12-10 NOTE — Telephone Encounter (Signed)
Prior auth initiated via CoverMyMeds.

## 2022-12-14 NOTE — Telephone Encounter (Signed)
Holly Jenkins (Key: [id number])  form thumbnail [employer] is reviewing your PA request and you will receive an electronic response. To follow up on this request by phone, please contact [employer] directly at [phone].  You may close this dialog and return to your dashboard to perform other tasks.

## 2022-12-15 NOTE — Telephone Encounter (Signed)
Coverage denied

## 2022-12-15 NOTE — Telephone Encounter (Signed)
Plan exclusion for visco - Orthovisc, Synvisc, Gelsyn.   Will check benefits for Zilretta.

## 2022-12-16 ENCOUNTER — Telehealth: Payer: Self-pay

## 2022-12-16 NOTE — Telephone Encounter (Signed)
VOB initiated for Zilretta inj right knee OA.   Also checking visco

## 2022-12-17 NOTE — Telephone Encounter (Signed)
Buy and Nori Riis is not covered under this patient's plan  Pharmacy [facility] is not covered through this patient's pharmacy benefits

## 2022-12-28 ENCOUNTER — Telehealth: Payer: Self-pay | Admitting: Family Medicine

## 2022-12-28 NOTE — Telephone Encounter (Signed)
I've run benefits for a couple of different Visco injections and its come back as a formulary exclusion.   I will need to contact the plan directly to confirm which, if any, visco is on the formulary.

## 2022-12-28 NOTE — Telephone Encounter (Signed)
Pt interested in repeating GEL injections, apparently insurance issues with this.  Pt states she was advised to have another steroid injection in order to help getting GEL approved, she had this done 11/26/2022.  Pt also states that her insurance prefers [facility] sure if this has any impact.

## 2022-12-29 NOTE — Telephone Encounter (Signed)
Called [employer] and spoke with rep.   Was advised that J-Code [id number] is covered under patient medical benefits. There will be a $70 co-pay and 0% co-insurance. No prior auth required.   Call ref # [id number]

## 2022-12-30 NOTE — Telephone Encounter (Signed)
Sent MyChart msg for pt to call for details/scheduling. (Orthovisc)

## 2022-12-31 NOTE — Telephone Encounter (Signed)
Called and spoke with patient. Discussed conflicting information provided for coverage of visco injections. Advised pt of direct call by me to [employer] to confirm coverage.   We will proceed with Orthovisc injection and pt will let us know if there are any issues with insurance coverage after claims have all been processed.

## 2023-01-12 ENCOUNTER — Other Ambulatory Visit: Payer: Self-pay

## 2023-01-12 ENCOUNTER — Ambulatory Visit: Payer: PRIVATE HEALTH INSURANCE | Admitting: Family Medicine

## 2023-01-12 VITALS — BP 102/72 | HR 68 | Ht 68.0 in | Wt 148.0 lb

## 2023-01-12 DIAGNOSIS — G8929 Other chronic pain: Secondary | ICD-10-CM

## 2023-01-12 DIAGNOSIS — M25561 Pain in right knee: Secondary | ICD-10-CM

## 2023-01-12 DIAGNOSIS — M1711 Unilateral primary osteoarthritis, right knee: Secondary | ICD-10-CM | POA: Diagnosis not present

## 2023-01-12 MED ORDER — HYALURONAN 30 MG/2ML IX SOSY
30.0000 mg | PREFILLED_SYRINGE | Freq: Once | INTRA_ARTICULAR | Status: AC
  Administered 2023-01-12: 30 mg via INTRA_ARTICULAR

## 2023-01-12 NOTE — Progress Notes (Unsigned)
   Shirlyn Goltz, PhD, LAT, ATC acting as a scribe for Lynne Leader, MD.  Holly Jenkins is a 47 y.o. female who presents to [place] at [facility] today for continued right knee pain and repeat gel shots.  Patient was last seen by Dr. Georgina Snell on 12/04/2022 and was given a right knee steroid injection, was advised we would work to reauthorize gel shots, and discussed osteoporosis risk.  Today, patient reports prior round of gel shots lasted for about 5 months.  Dx imaging: 12/29/20 R knee MRI             06/28/20 R knee XR  Pertinent review of systems: No fevers or chills  Relevant historical information: History of breast cancer.   Exam:  BP 102/72   Pulse 68   Ht 5\' 8"  (1.727 m)   Wt 148 lb (67.1 kg)   SpO2 97%   BMI 22.50 kg/m  General: Well Developed, well nourished, and in no acute distress.   MSK: Right knee normal-appearing normal motion.  Intact strength.    Lab and Radiology Results  Orthovisc right knee 1/3 Procedure: Real-time Ultrasound Guided Injection of right knee joint superior lateral patellar space Device: Philips Affiniti 50G Images permanently stored and available for review in PACS Verbal informed consent obtained.  Discussed risks and benefits of procedure. Warned about infection, bleeding, hyperglycemia damage to structures among others. Patient expresses understanding and agreement Time-out conducted.   Noted no overlying erythema, induration, or other signs of local infection.   Skin prepped in a sterile fashion.   Local anesthesia: Topical Ethyl chloride.   With sterile technique and under real time ultrasound guidance: Orthovisc 30 mg injected into knee joint. Fluid seen entering the joint capsule.   Completed without difficulty   Advised to call if fevers/chills, erythema, induration, drainage, or persistent bleeding.   Images permanently stored and available for review in the ultrasound unit.  Impression: Technically  successful ultrasound guided injection. Lot number: [id number]       Assessment and Plan: 47 y.o. female with right knee pain thought to be due to exacerbation of DJD.  Plan for Orthovisc injection series starting today.  Return in 1 week for Orthovisc injection 2/3.  Please see phone note for discussion regarding authorization of this medication.   PDMP not reviewed this encounter. Orders Placed This Encounter  Procedures   [place] LIMITED JOINT SPACE STRUCTURES LOW RIGHT(NO LINKED CHARGES)    Order Specific Question:   Reason for Exam (SYMPTOM  OR DIAGNOSIS REQUIRED)    Answer:   right knee pain    Order Specific Question:   Preferred imaging location?    Answer:   [place]   Meds ordered this encounter  Medications   Hyaluronan (ORTHOVISC) intra-articular injection 30 mg     Discussed warning signs or symptoms. Please see discharge instructions. Patient expresses understanding.   The above documentation has been reviewed and is accurate and complete Lynne Leader, M.D.

## 2023-01-12 NOTE — Patient Instructions (Signed)
Thank you for coming in today.   You received an injection today. Seek immediate medical attention if the joint becomes red, extremely painful, or is oozing fluid.   Schedule the 2nd Orthovisc injection for next week and the 3rd for the week after that. 

## 2023-01-13 NOTE — Telephone Encounter (Signed)
Injection schedule:  #1 01/12/23 #2 01/19/23 #3 01/26/23

## 2023-01-19 ENCOUNTER — Ambulatory Visit: Payer: PRIVATE HEALTH INSURANCE | Admitting: Family Medicine

## 2023-01-19 ENCOUNTER — Other Ambulatory Visit: Payer: Self-pay

## 2023-01-19 DIAGNOSIS — G8929 Other chronic pain: Secondary | ICD-10-CM | POA: Diagnosis not present

## 2023-01-19 DIAGNOSIS — M1711 Unilateral primary osteoarthritis, right knee: Secondary | ICD-10-CM | POA: Diagnosis not present

## 2023-01-19 DIAGNOSIS — M25562 Pain in left knee: Secondary | ICD-10-CM | POA: Diagnosis not present

## 2023-01-19 DIAGNOSIS — M25561 Pain in right knee: Secondary | ICD-10-CM

## 2023-01-19 DIAGNOSIS — M791 Myalgia, unspecified site: Secondary | ICD-10-CM

## 2023-01-19 DIAGNOSIS — R768 Other specified abnormal immunological findings in serum: Secondary | ICD-10-CM

## 2023-01-19 LAB — CK: Total CK: 139 U/L (ref 7–177)

## 2023-01-19 LAB — SEDIMENTATION RATE: Sed Rate: 2 mm/hr (ref 0–20)

## 2023-01-19 MED ORDER — HYALURONAN 30 MG/2ML IX SOSY
30.0000 mg | PREFILLED_SYRINGE | Freq: Once | INTRA_ARTICULAR | Status: AC
  Administered 2023-01-19: 30 mg via INTRA_ARTICULAR

## 2023-01-19 NOTE — Patient Instructions (Signed)
Thank you for coming in today.   Please get labs today before you leave   Call or go to the ER if you develop a large red swollen joint with extreme pain or oozing puss.     

## 2023-01-19 NOTE — Progress Notes (Signed)
Holly Jenkins presents to clinic today for Orthovisc injection right knee 2/3  She had a knee effusion after hiking over the weekend.  She notes generalized body aches and pains and has never had a rheumatologic workup.  Procedure: Real-time Ultrasound Guided Injection of right knee joint superior lateral patellar space Device: Philips Affiniti 50G Images permanently stored and available for review in PACS Verbal informed consent obtained.  Discussed risks and benefits of procedure. Warned about infection, bleeding, damage to structures among others. Patient expresses understanding and agreement Time-out conducted.   Noted no overlying erythema, induration, or other signs of local infection.   Skin prepped in a sterile fashion.   Local anesthesia: Topical Ethyl chloride.   With sterile technique and under real time ultrasound guidance: Orthovisc 30 mg injected into knee joint. Fluid seen entering the joint capsule.   Completed without difficulty   Advised to call if fevers/chills, erythema, induration, drainage, or persistent bleeding.   Images permanently stored and available for review in the ultrasound unit.  Impression: Technically successful ultrasound guided injection.  Lot number: [id number]  Orthovisc injection right knee 3/3  Laboratory rheumatologic workup obtained today.

## 2023-01-22 LAB — CYCLIC CITRUL PEPTIDE ANTIBODY, IGG: Cyclic Citrullin Peptide Ab: <16 UNITS

## 2023-01-22 LAB — ANTI-NUCLEAR AB-TITER (ANA TITER)
ANA TITER: 1:80 {titer} — ABNORMAL HIGH
ANA Titer 1: 1:320 {titer} — ABNORMAL HIGH

## 2023-01-22 LAB — ANA: Anti Nuclear Antibody (ANA): POSITIVE — AB

## 2023-01-22 LAB — RHEUMATOID FACTOR: Rheumatoid fact SerPl-aCnc: <14 IU/mL (ref <14)

## 2023-01-25 NOTE — Progress Notes (Signed)
ANA titer is elevated indicating possible rheumatologic process.  I will refer you to [facility] rheumatology.

## 2023-01-25 NOTE — Addendum Note (Signed)
Addended by: Rodolph Bong on: 01/25/2023 06:42 AM   Modules accepted: Orders

## 2023-01-26 ENCOUNTER — Other Ambulatory Visit: Payer: Self-pay

## 2023-01-26 ENCOUNTER — Ambulatory Visit: Payer: PRIVATE HEALTH INSURANCE | Admitting: Family Medicine

## 2023-01-26 DIAGNOSIS — M25561 Pain in right knee: Secondary | ICD-10-CM | POA: Diagnosis not present

## 2023-01-26 DIAGNOSIS — M1711 Unilateral primary osteoarthritis, right knee: Secondary | ICD-10-CM

## 2023-01-26 DIAGNOSIS — G8929 Other chronic pain: Secondary | ICD-10-CM

## 2023-01-26 MED ORDER — HYALURONAN 30 MG/2ML IX SOSY
30.0000 mg | PREFILLED_SYRINGE | Freq: Once | INTRA_ARTICULAR | Status: AC
Start: 2023-01-26 — End: 2023-01-26
  Administered 2023-01-26: 30 mg via INTRA_ARTICULAR

## 2023-01-26 NOTE — Patient Instructions (Addendum)
Thank you for coming in today.   You received an injection today. Seek immediate medical attention if the joint becomes red, extremely painful, or is oozing fluid.   Let me know how it goes with Rheumatology.   You should hear from them in about a week.

## 2023-01-27 NOTE — Progress Notes (Signed)
[place] presents to clinic today for Orthovisc injection right knee 3/3  Procedure: Real-time Ultrasound Guided Injection of right knee superior lateral patellar space Device: Philips Affiniti 50G Images permanently stored and available for review in PACS Verbal informed consent obtained.  Discussed risks and benefits of procedure. Warned about infection, bleeding, damage to structures among others. Patient expresses understanding and agreement Time-out conducted.   Noted no overlying erythema, induration, or other signs of local infection.   Skin prepped in a sterile fashion.   Local anesthesia: Topical Ethyl chloride.   With sterile technique and under real time ultrasound guidance: Orthovisc 30 mg injected into knee joint. Fluid seen entering the joint capsule.   Completed without difficulty   Advised to call if fevers/chills, erythema, induration, drainage, or persistent bleeding.   Images permanently stored and available for review in the ultrasound unit.  Impression: Technically successful ultrasound guided injection.  Lot number:[id number]  Additionally at the last visit rheumatologic workup was obtained.  ANA titer was elevated at 1: 320.  I referred to rheumatology at [facility]

## 2023-01-27 NOTE — Telephone Encounter (Signed)
Orthovisc for RIGHT knee OA  Can consider repeat inj on or after 07/29/23

## 2023-04-27 ENCOUNTER — Telehealth: Payer: Self-pay | Admitting: Family Medicine

## 2023-04-27 NOTE — Telephone Encounter (Signed)
Patient called with questions regarding the authorization for Orthovisc.  Per telephone note from 12/29/22: "Holly Jenkins and spoke with rep.   Was advised that J-Code [id number] is covered under patient medical benefits. There will be a $70 co-pay and 0% co-insurance. No prior auth required.  Call ref # [id number]"  She has been billed over $600 for each injection.   Can you help?

## 2023-04-28 NOTE — Telephone Encounter (Signed)
Lillia Abed, can you help with this or advise who to reach out to in regards?

## 2023-04-28 NOTE — Telephone Encounter (Signed)
She should reach out to billing regarding issue.   Please provide pt with J-code and prior auth reference number as a reference.

## 2023-05-04 NOTE — Telephone Encounter (Signed)
Email sent to billing.

## 2023-05-05 NOTE — Telephone Encounter (Signed)
Payer states Dr. Denyse Amass is OON for injectables - [facility] network.

## 2023-05-26 LAB — EXTERNAL GENERIC LAB PROCEDURE: COLOGUARD: NEGATIVE

## 2023-06-15 NOTE — Telephone Encounter (Signed)
Orthovisc injections DENIED in the past as Dr. Denyse Amass is considered "out-of-network". Plan will only cover Orthovisc injections when administered by a [facility] provider.    Please follow-up with patient to make sure that she is aware of the above.   Thanks!

## 2023-06-15 NOTE — Telephone Encounter (Signed)
Left VM for pt to call us
